# Patient Record
Sex: Female | Born: 1970 | Race: Black or African American | Hispanic: No | Marital: Married | Smoking: Never smoker
Health system: Southern US, Community
[De-identification: ages and names within clinical notes are randomized; demographics above are authoritative.]

## PROBLEM LIST (undated history)

## (undated) DIAGNOSIS — F419 Anxiety disorder, unspecified: Secondary | ICD-10-CM

## (undated) DIAGNOSIS — E785 Hyperlipidemia, unspecified: Secondary | ICD-10-CM

## (undated) DIAGNOSIS — D472 Monoclonal gammopathy: Secondary | ICD-10-CM

## (undated) DIAGNOSIS — D649 Anemia, unspecified: Secondary | ICD-10-CM

## (undated) DIAGNOSIS — K219 Gastro-esophageal reflux disease without esophagitis: Secondary | ICD-10-CM

## (undated) DIAGNOSIS — F32A Depression, unspecified: Secondary | ICD-10-CM

## (undated) DIAGNOSIS — T7840XA Allergy, unspecified, initial encounter: Secondary | ICD-10-CM

## (undated) DIAGNOSIS — F329 Major depressive disorder, single episode, unspecified: Secondary | ICD-10-CM

## (undated) HISTORY — DX: Monoclonal gammopathy: D47.2

## (undated) HISTORY — DX: Anemia, unspecified: D64.9

## (undated) HISTORY — DX: Allergy, unspecified, initial encounter: T78.40XA

## (undated) HISTORY — DX: Hyperlipidemia, unspecified: E78.5

## (undated) HISTORY — PX: TUBAL LIGATION: SHX77

## (undated) HISTORY — DX: Gastro-esophageal reflux disease without esophagitis: K21.9

---

## 1998-12-17 ENCOUNTER — Inpatient Hospital Stay (HOSPITAL_COMMUNITY): Admission: AD | Admit: 1998-12-17 | Discharge: 1998-12-17 | Payer: Self-pay | Admitting: *Deleted

## 1998-12-17 ENCOUNTER — Encounter: Payer: Self-pay | Admitting: *Deleted

## 1998-12-17 ENCOUNTER — Encounter: Admission: RE | Admit: 1998-12-17 | Discharge: 1998-12-17 | Payer: Self-pay | Admitting: Family Medicine

## 1999-07-31 ENCOUNTER — Inpatient Hospital Stay (HOSPITAL_COMMUNITY): Admission: AD | Admit: 1999-07-31 | Discharge: 1999-08-02 | Payer: Self-pay | Admitting: Obstetrics and Gynecology

## 1999-07-31 ENCOUNTER — Encounter (INDEPENDENT_AMBULATORY_CARE_PROVIDER_SITE_OTHER): Payer: Self-pay | Admitting: Specialist

## 2000-01-02 ENCOUNTER — Encounter: Admission: RE | Admit: 2000-01-02 | Discharge: 2000-01-02 | Payer: Self-pay | Admitting: Family Medicine

## 2000-04-20 ENCOUNTER — Encounter: Admission: RE | Admit: 2000-04-20 | Discharge: 2000-04-20 | Payer: Self-pay | Admitting: Family Medicine

## 2000-12-18 ENCOUNTER — Encounter: Admission: RE | Admit: 2000-12-18 | Discharge: 2000-12-18 | Payer: Self-pay | Admitting: Sports Medicine

## 2001-03-08 ENCOUNTER — Encounter: Admission: RE | Admit: 2001-03-08 | Discharge: 2001-03-08 | Payer: Self-pay | Admitting: Family Medicine

## 2002-01-18 ENCOUNTER — Emergency Department (HOSPITAL_COMMUNITY): Admission: EM | Admit: 2002-01-18 | Discharge: 2002-01-18 | Payer: Self-pay | Admitting: Emergency Medicine

## 2002-05-01 ENCOUNTER — Encounter: Admission: RE | Admit: 2002-05-01 | Discharge: 2002-05-01 | Payer: Self-pay | Admitting: Family Medicine

## 2002-10-22 ENCOUNTER — Encounter: Admission: RE | Admit: 2002-10-22 | Discharge: 2002-10-22 | Payer: Self-pay | Admitting: Family Medicine

## 2003-05-21 ENCOUNTER — Encounter: Admission: RE | Admit: 2003-05-21 | Discharge: 2003-05-21 | Payer: Self-pay | Admitting: Sports Medicine

## 2003-07-28 ENCOUNTER — Emergency Department (HOSPITAL_COMMUNITY): Admission: EM | Admit: 2003-07-28 | Discharge: 2003-07-28 | Payer: Self-pay | Admitting: Emergency Medicine

## 2003-07-28 ENCOUNTER — Encounter: Payer: Self-pay | Admitting: Emergency Medicine

## 2003-09-09 ENCOUNTER — Encounter: Admission: RE | Admit: 2003-09-09 | Discharge: 2003-09-09 | Payer: Self-pay | Admitting: Family Medicine

## 2004-05-26 ENCOUNTER — Encounter: Admission: RE | Admit: 2004-05-26 | Discharge: 2004-05-26 | Payer: Self-pay | Admitting: Family Medicine

## 2004-05-26 ENCOUNTER — Ambulatory Visit: Payer: Self-pay | Admitting: Family Medicine

## 2005-06-13 ENCOUNTER — Encounter (INDEPENDENT_AMBULATORY_CARE_PROVIDER_SITE_OTHER): Payer: Self-pay | Admitting: *Deleted

## 2005-06-13 ENCOUNTER — Ambulatory Visit: Payer: Self-pay | Admitting: Family Medicine

## 2005-06-13 LAB — CONVERTED CEMR LAB

## 2007-01-10 DIAGNOSIS — D509 Iron deficiency anemia, unspecified: Secondary | ICD-10-CM

## 2007-01-11 ENCOUNTER — Encounter (INDEPENDENT_AMBULATORY_CARE_PROVIDER_SITE_OTHER): Payer: Self-pay | Admitting: *Deleted

## 2007-09-12 ENCOUNTER — Ambulatory Visit: Payer: Self-pay | Admitting: Internal Medicine

## 2007-09-12 LAB — CONVERTED CEMR LAB
ALT: 15 units/L (ref 0–35)
AST: 19 units/L (ref 0–37)
Albumin: 4.1 g/dL (ref 3.5–5.2)
Alkaline Phosphatase: 48 units/L (ref 39–117)
BUN: 11 mg/dL (ref 6–23)
Basophils Absolute: 0 10*3/uL (ref 0.0–0.1)
Basophils Relative: 0 % (ref 0.0–1.0)
CO2: 29 meq/L (ref 19–32)
Calcium: 9.7 mg/dL (ref 8.4–10.5)
Chloride: 105 meq/L (ref 96–112)
Creatinine, Ser: 0.9 mg/dL (ref 0.4–1.2)
Eosinophils Absolute: 0 10*3/uL (ref 0.0–0.6)
Eosinophils Relative: 0.3 % (ref 0.0–5.0)
GFR calc Af Amer: 91 mL/min
GFR calc non Af Amer: 75 mL/min
Glucose, Bld: 97 mg/dL (ref 70–99)
HCT: 35.7 % — ABNORMAL LOW (ref 36.0–46.0)
Hemoglobin: 11.9 g/dL — ABNORMAL LOW (ref 12.0–15.0)
Lymphocytes Relative: 36 % (ref 12.0–46.0)
MCHC: 33.3 g/dL (ref 30.0–36.0)
MCV: 70.5 fL — ABNORMAL LOW (ref 78.0–100.0)
Monocytes Absolute: 0.6 10*3/uL (ref 0.2–0.7)
Monocytes Relative: 9.5 % (ref 3.0–11.0)
Neutro Abs: 3.7 10*3/uL (ref 1.4–7.7)
Neutrophils Relative %: 54.2 % (ref 43.0–77.0)
Platelets: 250 10*3/uL (ref 150–400)
Potassium: 4.1 meq/L (ref 3.5–5.1)
RBC: 5.06 M/uL (ref 3.87–5.11)
RDW: 15.1 % — ABNORMAL HIGH (ref 11.5–14.6)
Sed Rate: 35 mm/hr — ABNORMAL HIGH (ref 0–25)
Sodium: 141 meq/L (ref 135–145)
Total Bilirubin: 0.6 mg/dL (ref 0.3–1.2)
Total Protein: 8.4 g/dL — ABNORMAL HIGH (ref 6.0–8.3)
WBC: 6.7 10*3/uL (ref 4.5–10.5)

## 2007-09-25 ENCOUNTER — Ambulatory Visit: Payer: Self-pay | Admitting: Internal Medicine

## 2007-10-02 ENCOUNTER — Ambulatory Visit: Payer: Self-pay | Admitting: Cardiology

## 2007-10-07 ENCOUNTER — Encounter: Payer: Self-pay | Admitting: Internal Medicine

## 2007-10-07 DIAGNOSIS — E7800 Pure hypercholesterolemia, unspecified: Secondary | ICD-10-CM

## 2007-10-07 DIAGNOSIS — L80 Vitiligo: Secondary | ICD-10-CM | POA: Insufficient documentation

## 2007-10-07 HISTORY — DX: Pure hypercholesterolemia, unspecified: E78.00

## 2007-10-23 ENCOUNTER — Ambulatory Visit: Payer: Self-pay | Admitting: Internal Medicine

## 2007-10-24 LAB — CONVERTED CEMR LAB
Basophils Absolute: 0 10*3/uL (ref 0.0–0.1)
Eosinophils Absolute: 0 10*3/uL (ref 0.0–0.6)
HCT: 33.7 % — ABNORMAL LOW (ref 36.0–46.0)
MCHC: 33.2 g/dL (ref 30.0–36.0)
MCV: 70.8 fL — ABNORMAL LOW (ref 78.0–100.0)
Monocytes Relative: 9.8 % (ref 3.0–11.0)
Neutrophils Relative %: 54.7 % (ref 43.0–77.0)
RBC: 4.76 M/uL (ref 3.87–5.11)

## 2008-07-08 ENCOUNTER — Encounter: Payer: Self-pay | Admitting: *Deleted

## 2008-07-15 ENCOUNTER — Ambulatory Visit: Payer: Self-pay | Admitting: Family Medicine

## 2008-07-15 DIAGNOSIS — F411 Generalized anxiety disorder: Secondary | ICD-10-CM

## 2008-07-15 DIAGNOSIS — F4010 Social phobia, unspecified: Secondary | ICD-10-CM | POA: Insufficient documentation

## 2008-07-29 ENCOUNTER — Ambulatory Visit: Payer: Self-pay | Admitting: Family Medicine

## 2008-07-29 ENCOUNTER — Encounter: Payer: Self-pay | Admitting: Family Medicine

## 2008-08-04 ENCOUNTER — Encounter: Payer: Self-pay | Admitting: Family Medicine

## 2008-08-04 LAB — CONVERTED CEMR LAB
Ferritin: 84 ng/mL (ref 10–291)
HCT: 33.8 % — ABNORMAL LOW (ref 36.0–46.0)
Iron: 55 ug/dL (ref 42–145)
MCV: 72.8 fL — ABNORMAL LOW (ref 78.0–100.0)
Platelets: 267 10*3/uL (ref 150–400)
RBC: 4.64 M/uL (ref 3.87–5.11)
Total CHOL/HDL Ratio: 3.4
VLDL: 11 mg/dL (ref 0–40)
WBC: 4.8 10*3/uL (ref 4.0–10.5)

## 2008-08-05 ENCOUNTER — Telehealth: Payer: Self-pay | Admitting: *Deleted

## 2008-08-25 ENCOUNTER — Telehealth: Payer: Self-pay | Admitting: *Deleted

## 2008-08-26 ENCOUNTER — Telehealth: Payer: Self-pay | Admitting: Family Medicine

## 2008-09-18 ENCOUNTER — Encounter (INDEPENDENT_AMBULATORY_CARE_PROVIDER_SITE_OTHER): Payer: Self-pay | Admitting: Family Medicine

## 2008-09-18 ENCOUNTER — Ambulatory Visit: Payer: Self-pay | Admitting: Family Medicine

## 2008-09-18 ENCOUNTER — Encounter: Payer: Self-pay | Admitting: Family Medicine

## 2008-09-18 LAB — CONVERTED CEMR LAB: TSH: 1.253 microintl units/mL (ref 0.350–4.50)

## 2008-09-24 ENCOUNTER — Encounter: Payer: Self-pay | Admitting: Family Medicine

## 2008-10-12 ENCOUNTER — Telehealth: Payer: Self-pay | Admitting: Family Medicine

## 2008-12-08 ENCOUNTER — Emergency Department (HOSPITAL_COMMUNITY): Admission: EM | Admit: 2008-12-08 | Discharge: 2008-12-09 | Payer: Self-pay | Admitting: Emergency Medicine

## 2009-01-04 ENCOUNTER — Ambulatory Visit: Payer: Self-pay | Admitting: Family Medicine

## 2009-01-04 DIAGNOSIS — F329 Major depressive disorder, single episode, unspecified: Secondary | ICD-10-CM

## 2009-01-28 ENCOUNTER — Telehealth: Payer: Self-pay | Admitting: Family Medicine

## 2009-02-11 ENCOUNTER — Ambulatory Visit: Payer: Self-pay | Admitting: Family Medicine

## 2009-02-26 ENCOUNTER — Telehealth: Payer: Self-pay | Admitting: *Deleted

## 2009-03-01 ENCOUNTER — Ambulatory Visit: Payer: Self-pay | Admitting: Family Medicine

## 2009-03-17 ENCOUNTER — Telehealth: Payer: Self-pay | Admitting: Family Medicine

## 2009-04-15 ENCOUNTER — Encounter: Payer: Self-pay | Admitting: Family Medicine

## 2010-06-21 ENCOUNTER — Encounter: Payer: Self-pay | Admitting: Family Medicine

## 2010-12-15 NOTE — Miscellaneous (Signed)
  Clinical Lists Changes  Medications: Removed medication of LORAZEPAM 0.5 MG TABS (LORAZEPAM) 1-2 two times a day to three times a day as needed anxiety

## 2011-02-27 LAB — DIFFERENTIAL
Basophils Relative: 1 % (ref 0–1)
Eosinophils Absolute: 0.2 10*3/uL (ref 0.0–0.7)
Eosinophils Relative: 2 % (ref 0–5)
Lymphs Abs: 3.4 10*3/uL (ref 0.7–4.0)
Neutrophils Relative %: 51 % (ref 43–77)

## 2011-02-27 LAB — POCT I-STAT, CHEM 8
BUN: 22 mg/dL (ref 6–23)
Calcium, Ion: 1.13 mmol/L (ref 1.12–1.32)
Hemoglobin: 12.6 g/dL (ref 12.0–15.0)
Sodium: 141 mEq/L (ref 135–145)
TCO2: 26 mmol/L (ref 0–100)

## 2011-02-27 LAB — CBC
MCHC: 31.9 g/dL (ref 30.0–36.0)
MCV: 72.1 fL — ABNORMAL LOW (ref 78.0–100.0)
Platelets: 247 10*3/uL (ref 150–400)

## 2011-02-27 LAB — D-DIMER, QUANTITATIVE: D-Dimer, Quant: 0.28 ug/mL-FEU (ref 0.00–0.48)

## 2011-02-27 LAB — POCT CARDIAC MARKERS

## 2011-03-28 NOTE — Assessment & Plan Note (Signed)
[facility]                             PULMONARY OFFICE NOTE   NAME:[name], [name]                   MRN:          [id number]  DATE:09/12/2007                            DOB:          [birth year]    CHIEF COMPLAINT:  Lungs giving off a bad odor.   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  Both of these  problems came on about the same time and have already been evaluated by  both ENT (she cannot tell me who it was, but she underwent tonsillectomy  for this purpose and continues to intermittently cough up scabs of  tissue that she dates back all the way to the time of the throat  surgery).  She has been trying to treat the dry mouth with lots of mint  products and having no improvement in her breath, which she perceives to  be quite malodorous.  However, her husband has not been aware of a  problem.  She says that co-workers do notice it.   Interestingly, it makes no difference in terms of what she eats or what  time of day or night it is, that is, the symptom continues at the same  level 24 hours a day in terms of intensity.  There is nothing that makes  it better, and there is nothing that makes it worse.  She has also had  extensive dental work done and been tried on decongestant antihistamines  with no benefit.   The patient has also been seen by Dr. Wandalee [name], who does not feel her  halitosis is coming from her GI tract.  She does not recall any  procedures or interventions being done by Dr. Evette [name].   PAST MEDICAL HISTORY:  Tonsillectomy in 2000.   ALLERGIES:  SULFA CAUSES HIVES.   MEDICATIONS:  She uses lots of mint and menthol products, but no  medicated lozenges or medications.   SOCIAL HISTORY:  She has never smoked, she works as a Lawyer.   FAMILY HISTORY:  Positive for asthma in her mother and apparently  rheumatoid arthritis in a maternal  grandmother.   REVIEW OF SYSTEMS:  Taken in detail on work sheet and negative except  for anxiety and depression.   PHYSICAL EXAMINATION:  GENERAL:  This is a stoic, black female with  somewhat of a belle indifference affect and attitude.  VITAL SIGNS:  Stable.  HEENT:  Remarkably unremarkable, oropharynx is clear, and I did not  detect any halitosis, dentition appears intact, nasopharynx and ear  canals are clear bilaterally.  NECK:  Supple without cervical adenopathy or tenderness, trachea is  midline.  LUNGS:  The lung fields are perfectly clear bilaterally to auscultation  and percussion.  HEART:  Regular rhythm without murmur, gallop, or rub.  ABDOMEN:  Soft and nondistended.  EXTREMITIES:  No calf tenderness, cyanosis, clubbing, or edema.   IMPRESSION:  Chronic dry mouth associated with halitosis subjectively of  unclear etiology.  There are several possibilities here.  One is of  occult sinus  disease with olfactory dysfunction, the other is Sjogren's  syndrome.  Against both of these is the fact that the symptom does not  wax or wane but is pretty much constant 24 hours a day for the last 10  to 15 years.  This makes it much more likely that we are dealing with a  functional problem, especially since she is detecting halitosis today  and I detect none at all (nor does her husband).   To sort through the differential, I did recommend starting with a  chemistry profile looking at liver function tests and a CBC with  differential, a sed rate, and ANA.  A chest x-ray will also be obtained,  with a followup head and neck CT scan to be considered as well.   I have also asked her to see her ENT doctor the next time she feels that  there is a scab in her throat to be evaluated and to report back to me  what his name is so that I can record it in our records and send him a  copy of my thoughts.     Jessica [name]. Jessica Sires, MD, [facility]  Electronically Signed    MBW/MedQ  DD: 09/12/2007   DT: 09/12/2007  Job #: [id number]   cc:   Jessica [name], D.D.S.

## 2011-03-28 NOTE — Assessment & Plan Note (Signed)
[facility]                             PULMONARY OFFICE NOTE   NAME:Jessica [name], Jessica [name]                   MRN:          [id number]  DATE:09/25/2007                            DOB:          [birth year]    PULMONARY/EXTENDED SUMMARY FOLLOW-UP VISIT:   HISTORY:  A 40 year old black female with self-perceived halitosis,  (which neither her husband or I can appreciate).  It is present 24  hours a day and has been present for over 15 years with negative GI and  ENT evaluation previously.  Initially, she has also been told in the  past that she had iron deficiency.  It has never been corrected.  She  can't take iron pills.  The reason for this is they make her  nauseated. She has no unusual changes in bowel or bladder habits over  the last 15 years and has regular periods.   PHYSICAL EXAMINATION:  She is a somber but not overtly depressed  ambulatory black female in no acute distress.  She is afebrile with normal vital signs.  HEENT:  Unremarkable.  Her oropharynx is clear.  I do not appreciate any  halitosis at all.  NECK:  Supple without cervical adenopathy or tenderness.  Trachea is  midline.  No thyromegaly.  LUNGS:  Perfectly clear bilaterally to auscultation and percussion.  HEART:  Regular rhythm without murmur, rub or gallop.  ABDOMEN:  Soft, benign.  EXTREMITIES:  Warm without calf tenderness, clubbing, cyanosis or edema.   Chest x-ray was reviewed from September 12, 2007.  It is normal.   Lab studies were remarkable for a hematocrit of 35% with an MCV of 70.  Normal LFTs and renal function.   IMPRESSION:  1. No obvious explanation for halitosis.  The next step in her workup      would be to do a sinus and head CT scan with contrast, looking for      something that is unusual, as an olfactory or frontal lobe tumor,      since this has not been done.  No further workup is planned.  2. The iron-deficiency anemia may actually be more significant  than      the patient appreciates.  There are certainly unusual syndromes      that occur with iron deficiency and altered perception of olfaction      and taste have been reported; thus, the Pica syndrome.  I have      recommended that she start on NuIron 150 1 daily and return in four      weeks to see our nurse practitioner to make sure she is tolerating      it and at that point, recheck the CBC, iron, and TIBC to make sure      she is making headway.  If not, oncology/hematology evaluation      could be considered.    Jessica [name]. Jessica Sires, MD, [facility]  Electronically Signed   MBW/MedQ  DD: 09/25/2007  DT: 09/26/2007  Job #: [id number]

## 2012-07-28 ENCOUNTER — Ambulatory Visit (INDEPENDENT_AMBULATORY_CARE_PROVIDER_SITE_OTHER): Payer: BC Managed Care – PPO | Admitting: Family Medicine

## 2012-07-28 DIAGNOSIS — Z23 Encounter for immunization: Secondary | ICD-10-CM

## 2012-09-02 ENCOUNTER — Ambulatory Visit (HOSPITAL_COMMUNITY)
Admission: RE | Admit: 2012-09-02 | Discharge: 2012-09-02 | Disposition: A | Discharge status: Home / Self Care | Payer: BC Managed Care – PPO | Attending: Psychiatry | Admitting: Psychiatry

## 2012-09-02 ENCOUNTER — Encounter (HOSPITAL_COMMUNITY): Payer: Self-pay | Admitting: *Deleted

## 2012-09-02 DIAGNOSIS — F329 Major depressive disorder, single episode, unspecified: Secondary | ICD-10-CM | POA: Insufficient documentation

## 2012-09-02 DIAGNOSIS — F411 Generalized anxiety disorder: Secondary | ICD-10-CM | POA: Insufficient documentation

## 2012-09-02 DIAGNOSIS — F3289 Other specified depressive episodes: Secondary | ICD-10-CM | POA: Insufficient documentation

## 2012-09-02 HISTORY — DX: Depression, unspecified: F32.A

## 2012-09-02 HISTORY — DX: Anxiety disorder, unspecified: F41.9

## 2012-09-02 HISTORY — DX: Major depressive disorder, single episode, unspecified: F32.9

## 2012-09-02 NOTE — H&P (Signed)
Behavioral Health Medical Screening Exam  Jessica [name] is an 41 y.o. female.  Review of Systems  Constitutional: Negative.   HENT: Negative.   Eyes: Negative.   Respiratory: Negative.   Cardiovascular: Negative.   Gastrointestinal: Negative.   Genitourinary: Negative.   Musculoskeletal: Negative.   Skin: Negative.   Neurological: Negative.   Endo/Heme/Allergies: Negative.   Psychiatric/Behavioral: Positive for depression (Gotten worse had for 20 yr or more.  some thoughts of SI.  Yesterday but not today Rates 7) and suicidal ideas (Yesterday having SI.  Not today.  ). Negative for hallucinations, memory loss and substance abuse. The patient is nervous/anxious ([facility] therapy is making nervous.  over a 10) and has insomnia (take allergy med to help sleep.  onece take med to sleep will stay sleep.   Has a problem getting to sleep.  when wake up in the am feeling tired).     Physical Exam  Constitutional: She is oriented to person, place, and time. She appears well-developed and well-nourished. No distress.  HENT:  Head: Normocephalic and atraumatic.  Mouth/Throat: No oropharyngeal exudate.  Eyes: Pupils are equal, round, and reactive to light.  Neck: Normal range of motion. Neck supple.  GI: Soft. Bowel sounds are normal. She exhibits no distension. There is no tenderness.  Musculoskeletal: She exhibits no edema and no tenderness.  Neurological: She is alert and oriented to person, place, and time. She displays normal reflexes.  Skin: Skin is warm and dry. She is not diaphoretic.    There were no vitals taken for this visit.  Recommendations: Resources given by ACT Discussed group therapy Based on my evaluation the patient does not appear to have an emergency medical condition.  Rankin, Shuvon 09/02/2012, 2:47 PM

## 2012-09-02 NOTE — H&P (Signed)
Agree with the physical exam 

## 2012-09-02 NOTE — BH Assessment (Signed)
Assessment Note   Jessica [name] is an 41 y.o. female. Pt is walk in to [facility] accompanied by husband. Pt reports current out patient thaerapist recommended she come for evaluation due to SI with fleeting plans. She thinks of crashing car occasionally when driving, then thinks she would not want to harm others. She has had thoughts to OD but would not want to hurt her husband or daughters. Pt states she has not had SI today. Feels like crying every day but usually talks herself out of it.  Pt reports years of anxiety and depression, once was prescribed Zoloft and lorazepam, but disliked how it made her feel, and has not taken any medications for 4 years. Pt and husband report preoccupation with bad breath. She has sought help from Dr's and Dentists who report she is not having any medical problem. She can not be reassured, "People have said so...". Pt has been overwhelmed by fears about family and death. 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 48 year old daughter had 2 auto accidents this year. Husband is a Naval architect and a friend was seriously injured in truck accident. Pt sleeps poorly and uses antihistamine 3 x week for sleep. Her appetite is poor and she has lost 7 lbs in past month (pt is thin but not emaciated). She reports she is walking more as stress relief and wt loss may be due to the exercize. She is also cleaning her home excessively so she can be in control of "something". Discussed options. Pt and husband feel she can be safe at home and she requests IOP. Telephone call to [facility] with IOP and pt will stant Thursday morning at 9am, she and husband will pick up paperwork today. Pt and husband agree to return if symptoms worsen or she feels unsafe.  Axis I: Anxiety Disorder NOS and Depressive Disorder NOS Axis II:  Deferred Axis III:  Past Medical History  Diagnosis Date  . Anxiety   . Depression    Axis IV: occupational problems, other psychosocial or environmental problems and problems related to social environment Axis V: 41-50 serious symptoms  Past Medical History:  Past Medical History  Diagnosis Date  . Anxiety   . Depression     No past surgical history on file.  Family History: No family history on file.  Social History:  reports that she has never smoked. She has never used smokeless tobacco. She reports that she does not drink alcohol or use illicit drugs.  Additional Social History:  Alcohol / Drug Use Pain Medications: not abusing Prescriptions: not abusing Over the Counter: not abusing History of alcohol / drug use?: No history of alcohol / drug abuse  CIWA:   COWS:    Allergies:  Allergies  Allergen Reactions  . Sulfonamide Derivatives     REACTION: hives    Home Medications:  (Not in a hospital admission)  OB/GYN Status:  No LMP recorded.  General Assessment Data [facility] of Assessment: [facility] Assessment Services Living Arrangements: Spouse/significant other;Other relatives (2 children) Can pt return to current living arrangement?: Yes Admission Status: Voluntary Is patient capable of signing voluntary admission?: Yes Referral Source: Other (therapist )  Education Status Is patient currently in school?: No  Risk to self Suicidal Ideation: Yes-Currently Present Suicidal  Intent: No-Not Currently/Within Last 6 Months Is patient at risk for suicide?: No Suicidal Plan?: Yes-Currently Present Specify Current Suicidal Plan: thought when in a car to crash  (or thoughts to OD) Access to Means: Yes (does drive and OTC meds at home) What has been your use of drugs/alcohol within the last 12 months?: none Previous Attempts/Gestures: No How many times?: 0  Other Self Harm Risks: 0 Intentional Self Injurious Behavior: None Family Suicide History: Unknown Recent  stressful life event(s): Loss (Comment) (multiple deaths and stopped working 213) Persecutory voices/beliefs?: Yes (that she has bad breath) Depression: Yes Depression Symptoms: Insomnia;Despondent;Tearfulness;Feeling worthless/self pity Substance abuse history and/or treatment for substance abuse?: No Suicide prevention information given to non-admitted patients: Yes  Risk to Others Homicidal Ideation: No Thoughts of Harm to Others: No Current Homicidal Intent: No Current Homicidal Plan: No Access to Homicidal Means: No History of harm to others?: No Assessment of Violence: None Noted Does patient have access to weapons?: No Criminal Charges Pending?: No Does patient have a court date: No  Psychosis Hallucinations: None noted Delusions: Somatic (bad breath)  Mental Status Report Appear/Hygiene: Meticulous Eye Contact: Fair Motor Activity: Rigidity Speech: Soft;Logical/coherent Level of Consciousness: Alert Mood: Depressed;Anxious;Apprehensive Affect: Angry;Depressed;Apprehensive Anxiety Level: Moderate Thought Processes: Coherent;Relevant Judgement: Unimpaired Orientation: Person;Place;Time;Situation Obsessive Compulsive Thoughts/Behaviors: Minimal  Cognitive Functioning Concentration: Decreased Memory: Recent Intact;Remote Intact IQ: Average Insight: Fair Impulse Control: Good Appetite: Poor Weight Loss: 7  Weight Gain: 0  Sleep: Decreased Total Hours of Sleep:  (needs benadryl to sleep 2-3 x week) Vegetative Symptoms: None  ADLScreening [facility] Assessment Services) Patient's cognitive ability adequate to safely complete daily activities?: Yes Patient able to express need for assistance with ADLs?: Yes Independently performs ADLs?: Yes (appropriate for developmental age)  Abuse/Neglect [facility]) Verbal Abuse: Denies  Prior Inpatient Therapy Prior Inpatient Therapy: No  Prior Outpatient Therapy Prior Outpatient Therapy: Yes Prior Therapy Dates: current Prior  Therapy Facilty/Provider(s): [facility] ([phone]) Reason for Treatment: depression/anxiety  ADL Screening (condition at time of admission) Patient's cognitive ability adequate to safely complete daily activities?: Yes Patient able to express need for assistance with ADLs?: Yes Independently performs ADLs?: Yes (appropriate for developmental age) Weakness of Legs: None Weakness of Arms/Hands: None  Home Assistive Devices/Equipment Home Assistive Devices/Equipment: None    Abuse/Neglect Assessment (Assessment to be complete while patient is alone) Verbal Abuse: Denies Exploitation of patient/patient's resources: Denies Self-Neglect: Denies       Nutrition Screen- [facility] Adult/[facility]/AP Patient's home diet: Regular Have you recently lost weight without trying?: Yes If yes, how much weight have you lost?: 2-13 lb Have you been eating poorly because of a decreased appetite?:  (pt reports walking more also) Malnutrition Screening Tool Score: 2   Additional Information 1:1 In Past 12 Months?: No CIRT Risk: No Elopement Risk: No Does patient have medical clearance?: No     Disposition:  Disposition Disposition of Patient: Outpatient treatment Type of outpatient treatment: Psych Intensive Outpatient  On Site Evaluation by:   Reviewed with Physician:     Conan Bowens 09/02/2012 5:49 PM

## 2012-09-05 ENCOUNTER — Encounter (HOSPITAL_COMMUNITY): Payer: Self-pay

## 2012-09-05 ENCOUNTER — Other Ambulatory Visit (HOSPITAL_COMMUNITY): Payer: BC Managed Care – PPO | Attending: Psychiatry

## 2012-09-05 DIAGNOSIS — F32A Depression, unspecified: Secondary | ICD-10-CM

## 2012-09-05 DIAGNOSIS — F411 Generalized anxiety disorder: Secondary | ICD-10-CM | POA: Insufficient documentation

## 2012-09-05 DIAGNOSIS — F329 Major depressive disorder, single episode, unspecified: Secondary | ICD-10-CM

## 2012-09-05 DIAGNOSIS — F419 Anxiety disorder, unspecified: Secondary | ICD-10-CM

## 2012-09-05 NOTE — Progress Notes (Unsigned)
    Daily Group Progress Note  Program: IOP  Group Time: 9:00-10:30 am   Participation Level: Active  Behavioral Response: Appropriate  Type of Therapy:  Process Group  Summary of Progress: Today is patients first day in the group. She reports high anxiety associated with being in a group setting and feeling fearful from being around others. She is focused on trusting the group and feeling safe to share. She was introduced to the group and observed the group process.      Group Time: 10:30 am - 12:00 pm   Participation Level:  Active  Behavioral Response: Appropriate  Type of Therapy: Psycho-education Group  Summary of Progress: Patient learned the CBT skill of "Reframing" negative thoughts into neutral thoughts to reduce feelings of anxiety and depression.  Maxcine Ham, MSW, LCSW

## 2012-09-06 ENCOUNTER — Encounter (HOSPITAL_COMMUNITY): Payer: Self-pay

## 2012-09-06 ENCOUNTER — Other Ambulatory Visit (HOSPITAL_COMMUNITY): Payer: BC Managed Care – PPO

## 2012-09-06 DIAGNOSIS — F4010 Social phobia, unspecified: Secondary | ICD-10-CM

## 2012-09-06 MED ORDER — FLUVOXAMINE MALEATE 50 MG PO TABS: 50.0000 mg | ORAL_TABLET | Freq: Every day | ORAL | Status: DC

## 2012-09-06 NOTE — Progress Notes (Unsigned)
Psychiatric Assessment Adult  Patient Identification:  Jessica [name] Date of Evaluation:  09/06/2012 Chief Complaint: History of Chief Complaint:  No chief complaint on file. this patient is a 41 year old African American married mother who then referred to this setting because of anxiety. 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 The patient did married for 18 years and a good marriage. The patient's complaints are specifically that she feels extreme anxiety when she's in groups. She avoids going out of her house for she thinks she's going to do something embarrassing and ambulate herself. The patient also has fears that she has bad breath which he claims keeps her from being around people. The patient denies persistent daily depression. She says she sleeps only because she takes Benadryl for the last few months. The patient's appetite is normal and she is a good energy level. She claims she can think and concentrate fairly well. She does acknowledge that she feels worthless. She denies suicidal ideation but says she has fleeting thoughts now and then. She's never attempted suicide and has good reasons not to act. The patient denies the use of alcohol or drugs she denies any psychotic symptomatology. She denies mania. She denies a distinct episode of major depression. She denies anxiety symptoms consistent with generalized anxiety disorder, panic disorder or OCD. She does have some obsessive features like when he to be clean and orderly but it does not meet the criteria of OCD. The patient is no past psychiatric history. She's never been seen by a psychiatrist or hospitalized. Her primary care Dr. Lenard Galloway for Zoloft but it made her feel anxious and strange. The biggest stress in this patient's life and trauma is losing her mother 8 years ago 2 a heart attack. Her other big stressor is the death of her father this year who is a drug abuser. Patient describes herself  as somebody usually takes care of people particularly all people in her family. 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 She makes her self to the shopping she has to care for her 37 year old daughter. She's not sure if she will be able to gain from coming to group therapy here at the IOP  HPI Review of Systems Physical Exam  Depressive Symptoms: depressed mood,  (Hypo) Manic Symptoms:   Elevated Mood:  No Irritable Mood:  No Grandiosity:  No Distractibility:  No Labiality of Mood:  No Delusions:  No Hallucinations:  No Impulsivity:  No Sexually Inappropriate Behavior:  No Financial Extravagance:  No Flight of Ideas:  No  Anxiety Symptoms: Excessive Worry:  No Panic Symptoms:  No Agoraphobia:  No Obsessive Compulsive: No  Symptoms: None, Specific Phobias:  Yes Social Anxiety:  Yes  Psychotic Symptoms:  Hallucinations: No None Delusions:  No Paranoia:  No   Ideas of Reference:  No  PTSD Symptoms: Ever had a traumatic exposure:  No Had a traumatic exposure in the last month:  No Re-experiencing: No None Hypervigilance:  No Hyperarousal: No None Avoidance: Yes None  Traumatic Brain Injury: No   Past Psychiatric History: Diagnosis: Social Phobia  Hospitalizations:   Outpatient Care:   Substance Abuse Care:   Self-Mutilation:  Suicidal Attempts:   Violent Behaviors:    Past Medical History:   Past Medical History  Diagnosis Date  . Anxiety   . Depression    History of Loss of Consciousness:   Seizure History:   Cardiac History:   Allergies:   Allergies  Allergen Reactions  . Sulfonamide Derivatives     REACTION: hives   Current Medications:  Current Outpatient Prescriptions  Medication Sig Dispense Refill  . Multiple  Vitamin (MULTIVITAMIN) tablet Take 1 tablet by mouth daily.        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 Legal History: Hobbies/Interests:   Family History:   Family History  Problem Relation Age of Onset  . Alcohol abuse Father   . Drug abuse Father   . Alcohol abuse Maternal Uncle     Mental Status Examination/Evaluation: Objective:  Appearance: Casual  Eye Contact::  Good  Speech:  Clear and Coherent  Volume:  Normal  Mood:  anxiety  Affect:  Appropriate  Thought Process:  Coherent  Orientation:  Full  Thought Content:  WDL  Suicidal Thoughts:  No  Homicidal Thoughts:  No  Judgement:  Good  Insight:  Fair  Psychomotor Activity:  Normal  Akathisia:  No  Handed:  Right  AIMS (if indicated):    Assets:  Desire for Improvement    Laboratory/X-Ray Psychological Evaluation(s)        Assessment:  Axis I: Social Anxiety  AXIS I Social Anxiety  AXIS II Deferred  AXIS III Past Medical History  Diagnosis Date  . Anxiety   . Depression      AXIS IV occupational problems  AXIS V 51-60 moderate symptoms   Treatment Plan/Recommendations:  Plan of Care: Luvox 50  And refer to [facility]. For now the patient will continue in the IOP program at least for the next few days.  Laboratory:    Psychotherapy: Gretchen Short  Medications: Luvox 50  Routine PRN Medications:     Consultations:   Safety Concerns:    Other:      [facility] 10/25/20139:19 AM

## 2012-09-06 NOTE — Progress Notes (Unsigned)
    Daily Group Progress Note  Program: IOP  Group Time: 9:00-10:30 am   Participation Level: Active  Behavioral Response: Appropriate  Type of Therapy:  Process Group  Summary of Progress: Patient shared for the first time. She expressed high social anxiety and fear of taking in the group but then became tearful and talked about how overwhelmed and tired she is with dealing with the illness of depression and how she wants to feel happiness and have a purpose in life. She described how her children and family prevent her from taking her life but how she wants to get better. She denied current thoughts to harm self.     Group Time: 10:30 am - 12:00 pm   Participation Level:  Active  Behavioral Response: Appropriate  Type of Therapy: Psycho-education Group  Summary of Progress: Patient participated in a goodbye ceremony for two members ending the program today and said how they impacted her and wished them well while practicing healthy closure.   Maxcine Ham, MSW, LCSW

## 2012-09-09 ENCOUNTER — Other Ambulatory Visit (HOSPITAL_COMMUNITY): Payer: BC Managed Care – PPO

## 2012-09-10 ENCOUNTER — Other Ambulatory Visit (HOSPITAL_COMMUNITY): Payer: BC Managed Care – PPO

## 2012-09-10 NOTE — Progress Notes (Unsigned)
    Daily Group Progress Note  Program: IOP  Group Time: 9:00-10:30 am   Participation Level: Active  Behavioral Response: Appropriate  Type of Therapy:  Process Group  Summary of Progress: Patient is talking more and presents as more comfortable in a group setting. She started off parts of conversations when asked who wants to start and reports less anxiety. She was active int others discussions, but did not provide an update on her progress. She continues to work on managing anxiety in social situations and reducing depression symptoms.      Group Time: 10:30 am - 12:00 pm   Participation Level:  Active  Behavioral Response: Appropriate  Type of Therapy: Psycho-education Group  Summary of Progress: Patient participated in a goodbye ceremony for a member ending the program and practiced the skill of communication, expressing feelings and having healthy closure.   Maxcine Ham, MSW, LCSW

## 2012-09-11 ENCOUNTER — Other Ambulatory Visit (HOSPITAL_COMMUNITY): Payer: BC Managed Care – PPO

## 2012-09-12 ENCOUNTER — Other Ambulatory Visit (HOSPITAL_COMMUNITY): Payer: BC Managed Care – PPO

## 2012-09-12 ENCOUNTER — Telehealth (HOSPITAL_COMMUNITY): Payer: Self-pay | Admitting: Psychiatry

## 2012-09-13 ENCOUNTER — Other Ambulatory Visit (HOSPITAL_COMMUNITY): Payer: BC Managed Care – PPO | Attending: Psychiatry

## 2012-09-13 DIAGNOSIS — F429 Obsessive-compulsive disorder, unspecified: Secondary | ICD-10-CM | POA: Insufficient documentation

## 2012-09-13 DIAGNOSIS — G4700 Insomnia, unspecified: Secondary | ICD-10-CM | POA: Insufficient documentation

## 2012-09-13 DIAGNOSIS — F411 Generalized anxiety disorder: Secondary | ICD-10-CM | POA: Insufficient documentation

## 2012-09-13 MED ORDER — RISPERIDONE 1 MG PO TABS: 1.0000 mg | ORAL_TABLET | Freq: Every day | ORAL | Status: DC

## 2012-09-13 NOTE — Progress Notes (Unsigned)
    Daily Group Progress Note  Program: IOP  Group Time: 9:00-10:30 am   Participation Level: Active  Behavioral Response: Appropriate  Type of Therapy:  Process Group  Summary of Progress: Patient related to others who shared their symptoms of depression and she expressed similar struggles. 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 She described how she struggles with severe depression and often feels "alone" in her illness.      Group Time: 10:30 am - 12:00 pm   Participation Level:  Active  Behavioral Response: Appropriate  Type of Therapy: Psycho-education Group  Summary of Progress: Patient participated in a skills group on feelings and causes. They discussed how to manage difficult feelings and normalized difficult feelings.   Maxcine Ham, MSW, LCSW

## 2012-09-16 ENCOUNTER — Other Ambulatory Visit (HOSPITAL_COMMUNITY): Payer: BC Managed Care – PPO | Admitting: Psychiatry

## 2012-09-16 DIAGNOSIS — F4010 Social phobia, unspecified: Secondary | ICD-10-CM

## 2012-09-17 ENCOUNTER — Other Ambulatory Visit (HOSPITAL_COMMUNITY): Payer: BC Managed Care – PPO

## 2012-09-17 DIAGNOSIS — F4010 Social phobia, unspecified: Secondary | ICD-10-CM | POA: Insufficient documentation

## 2012-09-17 NOTE — Progress Notes (Unsigned)
    Daily Group Progress Note  Program: IOP  Group Time: 9:00-10:30 am   Participation Level: Minimal  Behavioral Response: Appropriate  Type of Therapy:  Process Group  Summary of Progress: Patient reports still having high social anxiety, but states it has subsided some from attending the group and relating to others with similar life experiences and symptoms. Patient was offered more time in the program but indicated she wished to end tomorrow as was original scheduled. She then questioned her decision because she knows the group has helped her reduce her anxiety. She identified her tendency is to "run and avoid" the situations that make her uncomfortable, even if they are helping her. The group requested she stay and gave her support but patient was still unsure of her decision as to when she wanted to end the program.      Group Time: 10:30 am - 12:00 pm   Participation Level:  Minimal  Behavioral Response: Appropriate  Type of Therapy: Psycho-education Group  Summary of Progress: Patient participated in an activity that promoted fun, laughter, distraction as well as allowing the group to get to know each other better. Patient admitted that it was enjoyable to laugh and be distracted from depression symptoms for a period of time. This promoted the ability to feel enjoyment during a depressive episode and teach the skills of distraction to manage symptoms.   Maxcine Ham, MSW, LCSW

## 2012-09-17 NOTE — Progress Notes (Signed)
    Daily Group Progress Note  Program: IOP  Group Time: 9:00-10:30 am    Participation Level: Minimal  Behavioral Response: Appropriate  Type of Therapy:  Process Group  Summary of Progress: Patient continues to struggle with soical anxiety and talked of how she is pushing herself to share in the group setting and feels like she is making some progress with sharing more. She talked about job stress and how difficult it is to function affectively with her anxiety level. She is working on expressing feelings to others and with "not feeling alone" in her mental illness.      Group Time: 10:30 am - 12:00 pm   Participation Level:  Active  Behavioral Response: Appropriate  Type of Therapy: Psycho-education Group  Summary of Progress:  Patient participated in a group focused on grief and loss and explored current losses and barriers to affective grieving.   Carman Ching, LCSW

## 2012-09-18 ENCOUNTER — Other Ambulatory Visit (HOSPITAL_COMMUNITY): Payer: BC Managed Care – PPO | Admitting: Psychiatry

## 2012-09-18 MED ORDER — RISPERIDONE 1 MG PO TABS: 1.0000 mg | ORAL_TABLET | Freq: Every day | ORAL | Status: DC

## 2012-09-18 MED ORDER — FLUVOXAMINE MALEATE 50 MG PO TABS: 150.0000 mg | ORAL_TABLET | Freq: Every morning | ORAL | Status: DC

## 2012-09-18 NOTE — Progress Notes (Signed)
Patient ID: Jessica [name], female   DOB: Oct 13, 1971, 41 y.o.   MRN: [id number] D:  According to pt, Thea Silversmith, [facility] is her husband's therapist not hers.  A:  Pt referred to [facility], [facility].  R:  Pt receptive.

## 2012-09-18 NOTE — Patient Instructions (Signed)
Patient completed MH-IOP today.  Will follow up [facility], LCSW on 09-19-12 @ 9 a.m and Dr. Donell Beers 10-04-12 @ 10:15 a.m..  Encouraged support groups.

## 2012-09-18 NOTE — Progress Notes (Signed)
  [facility] Intensive Outpatient Program Discharge Summary  Jessica [name] [id number]     Discharge Note  Patient:  Jessica [name] is an 41 y.o., female DOB:  Dec 21, 1970  Date of Admission:  09-05-12  Date of Discharge:  09-18-12  Reason for Admission:Depression, OCD,  Hospital Course:Pt was admitted to IOP and started on Luvox 50 mg po q am for her OCD , she tol her meds well. She continued to ruminate about her bad breath. So was started on Risperdal 0.5 mg po q hs and this helped her insomnia and her obcessions so it was increased to 1 mg q hs and the Luvox was increased to 150 mg q am.Pt did well, sleep, app-good, mood-bright , no si/ hi and decrease in her OCD,she was coping well and tol meds well.  Mental Status at Discharge:Alert, O/3, affect-pleasant. Mood-stable, mildly anxious. No Si/ HI. No Hallucinations/ delusions.  Recent/ Remote memory-good, Judgement/ insight-good, concentration/ recall-good.  Lab Results: No results found for this or any previous visit (from the past 48 hour(s)).  Current outpatient prescriptions:fluvoxaMINE (LUVOX) 50 MG tablet, Take 3 tablets (150 mg total) by mouth every morning., Disp: 90 tablet, Rfl: 0;  Multiple Vitamin (MULTIVITAMIN) tablet, Take 1 tablet by mouth daily., Disp: , Rfl: ;  risperiDONE (RISPERDAL) 1 MG tablet, Take 1 tablet (1 mg total) by mouth daily., Disp: 30 tablet, Rfl: 0 [DISCONTINUED] fluvoxaMINE (LUVOX) 50 MG tablet, Take 1 tablet (50 mg total) by mouth at bedtime., Disp: 30 tablet, Rfl: 3;  [DISCONTINUED] fluvoxaMINE (LUVOX) 50 MG tablet, Take 3 tablets (150 mg total) by mouth every morning., Disp: 90 tablet, Rfl: 0;  [DISCONTINUED] risperiDONE (RISPERDAL) 1 MG tablet, Take 1 tablet (1 mg total) by mouth daily., Disp: 30 tablet, Rfl: 0 [DISCONTINUED] risperiDONE (RISPERDAL) 1 MG tablet, Take 1 tablet (1 mg total) by mouth daily., Disp: 30 tablet, Rfl: 0  Axis Diagnosis:   Axis I: Anxiety Disorder NOS and  Obsessive Compulsive Disorder Axis II: Deferred Axis III:  Past Medical History  Diagnosis Date  . Anxiety   . Depression    Axis IV: other psychosocial or environmental problems and problems related to social environment Axis V: 61-70 mild symptoms   Level of Care:  OP  Discharge destination:  Home  Is patient on multiple antipsychotic therapies at discharge:  No    Has Patient had three or more failed trials of antipsychotic monotherapy by history:  No  Patient phone:  [phone] (home)  Patient address:   [address] [place] [address],   Follow-up recommendations:  Activity:  as  tolerated Diet:  Regular Other:  F/u with Dr Donell Beers, for meds and Gretchen Short for therapy.    The patient received suicide prevention pamphlet:  Yes Margit Banda 09/18/2012, 11:44 AM  09/18/2012

## 2012-09-18 NOTE — Progress Notes (Unsigned)
    Daily Group Progress Note  Program: IOP  Group Time: 9:00-10:30 am   Participation Level: Active  Behavioral Response: Appropriate  Type of Therapy:  Process Group  Summary of Progress: This is the patients final day in the group. She chose to end group today even though she was offered additional group time because she stated she felt more comfortable discussing her stressors with an individual therapist.  Review of progress since starting the program:  this patient is a 41 year old African American married mother who then referred to this setting because of anxiety. 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 The patient initially reported feeling extreme anxiety when she's in groups and around others. She initially sat closed off from group members and did not talk in the group, but as the days went on, she felt more comfortable talking in the group setting and began sharing how extreme her depression and anxiety symptoms were and how they negatively impacted her working and having healthy relationships with others. She shared how the anxiety became overwhelming and she was unable to work as a Lawyer and was not sure if she would be able to return to her job. She related to others with high depression and how difficult it was for her to leave her home and have the motivation to come to the group and interact. Patient starting sharing voluntarily and even read the daily thought a few times by offering to share. She never talked in the group about the trauma from the death of her mother 8 years ago or the death of her father this past year and stated she "feard opening up too much" to the group due to still having severe anxiety.  Patient wrote a poem to the group and read it today that expressed how grateful she was to the members for "allowing her to no longer feel alone in her illness". She shared how she has made progress learning and practicing the  skills of affective communication in a group setting and with pushing herself to challenge her fears of others. She reports a reduction in anxiety and depression symptoms and states she feels ready to return to her individual therapist for ongoing treatment.      Group Time: 10:30 am - 12:00 pm   Participation Level:  Active  Behavioral Response: Appropriate  Type of Therapy: Psycho-education Group  Summary of Progress: Patient participated in a discussion on how to access mental health support groups during and after their time in the program to manage depression and anxiety symptoms and maintain continued wellness.   Carman Ching, LCSW

## 2012-09-18 NOTE — Progress Notes (Signed)
Patient ID: Jessica [name], female   DOB: July 19, 1971, 41 y.o.   MRN: [id number] D:  This is a 41 yr old married african [place] female, referred by therapist Jessica [name], [facility]), treatment for anxiety and depression with SI.  Pt currently denies any SI/HI or A/V hallucinations. Although she is feeling a little better, she states she is still worrying about the future.  Improved sleep (7 hours), appetite is good, and fair concentration.  States group is helpful.  Reports opening up some.  Wants to continue working on social anxiety with her therapist.  A:D/C today.  F/U with Jessica Short, LCSW on 09-19-12 @ 9 a.m and Dr. Donell [name] on 10-04-12 @ 10:15 a.m.  Encouraged support groups.  R:  Pt receptive.

## 2012-09-19 ENCOUNTER — Other Ambulatory Visit (HOSPITAL_COMMUNITY): Payer: BC Managed Care – PPO

## 2012-09-20 ENCOUNTER — Other Ambulatory Visit (HOSPITAL_COMMUNITY): Payer: BC Managed Care – PPO

## 2012-09-23 ENCOUNTER — Other Ambulatory Visit (HOSPITAL_COMMUNITY): Payer: BC Managed Care – PPO

## 2012-09-24 ENCOUNTER — Other Ambulatory Visit (HOSPITAL_COMMUNITY): Payer: BC Managed Care – PPO

## 2012-09-25 ENCOUNTER — Other Ambulatory Visit (HOSPITAL_COMMUNITY): Payer: BC Managed Care – PPO

## 2012-09-26 ENCOUNTER — Other Ambulatory Visit (HOSPITAL_COMMUNITY): Payer: BC Managed Care – PPO

## 2012-10-04 ENCOUNTER — Ambulatory Visit (INDEPENDENT_AMBULATORY_CARE_PROVIDER_SITE_OTHER): Payer: BC Managed Care – PPO | Admitting: Psychiatry

## 2012-10-04 ENCOUNTER — Encounter (HOSPITAL_COMMUNITY): Payer: Self-pay | Admitting: Psychiatry

## 2012-10-04 DIAGNOSIS — F3342 Major depressive disorder, recurrent, in full remission: Secondary | ICD-10-CM | POA: Insufficient documentation

## 2012-10-04 DIAGNOSIS — F329 Major depressive disorder, single episode, unspecified: Secondary | ICD-10-CM

## 2012-10-04 MED ORDER — RISPERIDONE 1 MG PO TABS: 1.0000 mg | ORAL_TABLET | Freq: Every day | ORAL | Status: DC

## 2012-10-04 MED ORDER — FLUVOXAMINE MALEATE 50 MG PO TABS: 50.0000 mg | ORAL_TABLET | Freq: Every morning | ORAL | Status: DC

## 2012-10-04 NOTE — Progress Notes (Signed)
Psychiatric Assessment Adult  Patient Identification:  Jessica [name] Date of Evaluation:  10/04/2012 Chief Complaint: Follow up and establish care History of Chief Complaint:  No chief complaint on file. this patient is a 41 year old African American mother who is married and recently finished the IOP program after a ten-day period. I met her when she was initially evaluated and started the IOP program. Since being in the IOP program she is clearly better. She knowledge is that she is better coping skills. She also says that now she can communicate much better to the general population but most importantly to her husband. She was having problems communicating and negotiating but that is better. She also feels a better sense of worth and I do. Her mood is distinctly improved. At this time she is sleeping and eating well. She is a good energy level and can concentrate without problems. She enjoys walking and taking care of her grandson. The patient initially coming to the IOP had significant suicidal considerations. Now she denies any suicidal ideation. It should be noted that she's never made a suicide attempt. The patient denies the use of alcohol or drugs. She's never been psychotic. And close evaluation she is noted distant history of major depression but clearly a month ago when starting in the IOP program she did have symptoms consistent with major depression. At that time in retrospect she was persistently depressed with a reduction in her sleep. She also had significant reductions in her energy, psychomotor retardation and anhedonia. She was self limited that she seriously contemplated ending her life. The patient's major complaint is a phobia believe that she has malodorous breath. She says this is been present in her life ever since she was in middle school. Somehow it must of gotten worse in some way. It is not completely clear how she declined with an episode of major depression. The patient  has entered into therapy with Mr. Buena Irish and is doing well. She shares with me that when her Luvox dose was increased from 100 mg to a higher dose of 150 she felt worse in that she was more anxious. Noted is the patient is on Risperdal 1 mg which helped her sleep and she says in some ways does seem to help her communicate better.  HPI Review of Systems Physical Exam  Depressive Symptoms: depressed mood,  (Hypo) Manic Symptoms:   Elevated Mood:  No Irritable Mood:  No Grandiosity:  No Distractibility:  No Labiality of Mood:  No Delusions:  No Hallucinations:  No Impulsivity:  No Sexually Inappropriate Behavior:  No Financial Extravagance:  No Flight of Ideas:  No  Anxiety Symptoms: Excessive Worry:  No Panic Symptoms:  No Agoraphobia:  No Obsessive Compulsive: No  Symptoms: None, Specific Phobias:  Yes Social Anxiety:  No  Psychotic Symptoms:  Hallucinations: No None Delusions:  No Paranoia:  No   Ideas of Reference:  No  PTSD Symptoms: Ever had a traumatic exposure:  No Had a traumatic exposure in the last month:  No Re-experiencing:  None Hypervigilance:  No Hyperarousal: No None Avoidance: No None  Traumatic Brain Injury: No   Past Psychiatric History: Diagnosis: Major Depression  Hospitalizations: none  Outpatient Care: IOP  Substance Abuse Care:   Self-Mutilation:   Suicidal Attempts:   Violent Behaviors:    Past Medical History:   Past Medical History  Diagnosis Date  . Anxiety   . Depression    History of Loss of Consciousness:   Seizure History:  Cardiac History:   Allergies:   Allergies  Allergen Reactions  . Sulfonamide Derivatives     REACTION: hives   Current Medications:  Current Outpatient Prescriptions  Medication Sig Dispense Refill  . fluvoxaMINE (LUVOX) 50 MG tablet Take 3 tablets (150 mg total) by mouth every morning.  90 tablet  0  . Multiple Vitamin (MULTIVITAMIN) tablet Take 1 tablet by mouth daily.      . risperiDONE  (RISPERDAL) 1 MG tablet Take 1 tablet (1 mg total) by mouth daily.  30 tablet  0    Previous Psychotropic Medications:  Medication Dose                          Substance Abuse History in the last 12 months: Substance Age of 1st Use Last Use Amount Specific Type   Medical Consequences of Substance Abuse:   Legal Consequences of Substance Abuse:   Family Consequences of Substance Abuse:   Blackouts:   DT's:   Withdrawal Symptoms:   None  Social History: Current Place of Residence[place] of Birth:  Family Members:  Marital Status:  Married Children: 2  Sons:   Daughters:  Relationships:  Education:  Corporate treasurer Problems/Performance:  Religious Beliefs/Practices:  History of Abuse:  Teacher, music History:   Legal History:  Hobbies/Interests:   Family History:   Family History  Problem Relation Age of Onset  . Alcohol abuse Father   . Drug abuse Father   . Alcohol abuse Maternal Uncle     Mental Status Examination/Evaluation: Objective:  Appearance: Casual  Eye Contact::  Good  Speech:  Clear and Coherent  Volume:  Normal  Mood:  Normal  Affect:  Appropriate  Thought Process:  Coherent  Orientation:  Full  Thought Content:  WDL  Suicidal Thoughts:  No  Homicidal Thoughts:  No  Judgement:  Good  Insight:  Good  Psychomotor Activity:  Normal  Akathisia:  No  Handed:  Right  AIMS (if indicated):    Assets:  Desire for Improvement    Laboratory/X-Ray Psychological Evaluation(s)        Assessment:  Axis I: Major Depression, single episode  AXIS I Major Depression, single episode  AXIS II Deferred  AXIS III Past Medical History  Diagnosis Date  . Anxiety   . Depression      AXIS IV occupational problems  AXIS V 61-70 mild symptoms   Treatment Plan/Recommendations:  Plan of Care: at this time the patient will reduce her Luvox from 150 mg to 100 mg. For the time being she'll continue taking  Risperdal 1 mg every night when she returns in 2 and half months we shall consider reducing it. I suspect it was used as an add-on for seems to be an OCD feature. Technically she does not meet the criteria for an OCD condition. I suspect she had major depression and had as a feature associated with it to be obsessive about a chronic concern about her breath.  Laboratory:    Psychotherapy:[facility]  [facility]   Medications: Luvox 150,  Risperidol 1mg   Routine PRN Medications:    Consultations:   Safety Concerns:    Other:      Lucas Mallow, MD 11/22/201310:42 AM

## 2012-10-07 ENCOUNTER — Telehealth (HOSPITAL_COMMUNITY): Payer: Self-pay | Admitting: *Deleted

## 2012-10-07 NOTE — Telephone Encounter (Signed)
Opened in error

## 2012-10-17 ENCOUNTER — Other Ambulatory Visit (HOSPITAL_COMMUNITY): Payer: Self-pay | Admitting: *Deleted

## 2012-10-17 DIAGNOSIS — F3342 Major depressive disorder, recurrent, in full remission: Secondary | ICD-10-CM

## 2012-10-17 MED ORDER — RISPERIDONE 1 MG PO TABS: 1.0000 mg | ORAL_TABLET | Freq: Every day | ORAL | Status: DC

## 2012-10-17 MED ORDER — FLUVOXAMINE MALEATE 50 MG PO TABS: 50.0000 mg | ORAL_TABLET | Freq: Every morning | ORAL | Status: DC

## 2012-10-17 MED ORDER — RISPERIDONE 1 MG PO TABS
1.0000 mg | ORAL_TABLET | Freq: Every day | ORAL | Status: DC
Start: 2012-10-17 — End: 2013-03-05

## 2012-10-17 NOTE — Telephone Encounter (Signed)
RX were printed in error on 11/22. Pharmacy never received them. Sent through [employer] as directed by [employer]

## 2012-10-18 ENCOUNTER — Ambulatory Visit (HOSPITAL_COMMUNITY): Payer: Self-pay | Admitting: Psychiatry

## 2012-11-01 ENCOUNTER — Telehealth (HOSPITAL_COMMUNITY): Payer: Self-pay

## 2012-12-04 ENCOUNTER — Ambulatory Visit (INDEPENDENT_AMBULATORY_CARE_PROVIDER_SITE_OTHER): Payer: BC Managed Care – PPO | Admitting: Psychiatry

## 2012-12-04 DIAGNOSIS — F3342 Major depressive disorder, recurrent, in full remission: Secondary | ICD-10-CM

## 2012-12-04 DIAGNOSIS — F331 Major depressive disorder, recurrent, moderate: Secondary | ICD-10-CM | POA: Insufficient documentation

## 2012-12-04 DIAGNOSIS — F325 Major depressive disorder, single episode, in full remission: Secondary | ICD-10-CM

## 2012-12-04 MED ORDER — FLUVOXAMINE MALEATE 50 MG PO TABS: 50.0000 mg | ORAL_TABLET | Freq: Every morning | ORAL | Status: DC

## 2013-03-05 ENCOUNTER — Ambulatory Visit (INDEPENDENT_AMBULATORY_CARE_PROVIDER_SITE_OTHER): Payer: BC Managed Care – PPO | Admitting: Psychiatry

## 2013-03-05 DIAGNOSIS — F3342 Major depressive disorder, recurrent, in full remission: Secondary | ICD-10-CM

## 2013-03-05 DIAGNOSIS — F332 Major depressive disorder, recurrent severe without psychotic features: Secondary | ICD-10-CM | POA: Insufficient documentation

## 2013-03-05 MED ORDER — FLUVOXAMINE MALEATE 50 MG PO TABS: 50.0000 mg | ORAL_TABLET | Freq: Every morning | ORAL | Status: DC

## 2013-03-05 NOTE — Progress Notes (Signed)
[facility] MD Progress Note  03/05/2013 4:10 PM Jessica [name]  MRN:  [id number] Subjective:  Feels well Diagnosis:  Axis I: Major Depression, Recurrent severe Today the patient is seen alone. She says that she is feeling distinctly improved. She says she is more social more confident and is getting out more. She is a very good home life at this time she is caring for her 42-year-old granddaughter and recently got a puppy. The patient says she is socializing more with her husband although she wants to get closure to him. The patient seems to have much less of her preoccupation about bad breath. The patient denies any depression. She says she is sleeping and eating well. She enjoys her granddaughter, reading and watching TV. The patient is walking every morning. The patient denies problems with thinking and concentrating. She denies worthlessness. She denies suicidal ideation. Her only complaint is that she's in fact gained about 20 pounds over the last 6 months. She claims it her fault because when she is alone she is a lot. The possibility that it's related to her medications was not brought up by her. In a close evaluation the patient denies symptoms or OCD. She does not count, clean her checked. She does not have stereotypic compulsive behaviors or intrusive thoughts and ideas. The patient denies any chest pain or shortness of breath. She denies any neurological complaints. The patient has not fallen has no pain and generally has no physical complaints. The patient is functioning at a much higher level and believes the medications have been very helpful. She particularly thinks the Luvox helps her mind be a piece and not race and not assess. ADL's:  Intact  Sleep: Good  Appetite:  Good  Suicidal Ideation:  no Homicidal Ideation:  none AEB (as evidenced by):  Psychiatric Specialty Exam: ROS  There were no vitals taken for this visit.There is no weight on file to calculate BMI.  General  Appearance: Casual  Eye Contact::  Good  Speech:  Normal Rate  Volume:  Normal  Mood:  Euthymic  Affect:  Appropriate  Thought Process:  Coherent  Orientation:  Full (Time, Place, and Person)  Thought Content:  WDL  Suicidal Thoughts:  No  Homicidal Thoughts:  No  Memory:  normal  Judgement:  Good  Insight:  Good  Psychomotor Activity:nl  Concentration:  Good  Recall:  Good  Akathisia:  No  Handed:  Right  AIMS (if indicated):     Assets:  Desire for Improvement  Sleep:      Current Medications: Current Outpatient Prescriptions  Medication Sig Dispense Refill  . fluvoxaMINE (LUVOX) 50 MG tablet Take 1 tablet (50 mg total) by mouth every morning. 2 qam  60 tablet  5  . fluvoxaMINE (LUVOX) 50 MG tablet Take 1 tablet (50 mg total) by mouth every morning. 2 qam  60 tablet  5  . Multiple Vitamin (MULTIVITAMIN) tablet Take 1 tablet by mouth daily.       No current facility-administered medications for this visit.    Lab Results: No results found for this or any previous visit (from the past 48 hour(s)).  Physical Findings: AIMS:  , ,  ,  ,    CIWA:    COWS:     Treatment Plan Summary: At this time the patient has agreed to discontinue her Risperdal. She presently is taking only 0.5 mg. I suggested to her the outside chance that it might be contributing to her weight gain. I  suspect we used it for her OCD symptomatology which is now much much better. At this time we'll continue her Luvox at 100 mg a day. This patient will return to see me in 2 months giving that were stopping her Risperdal. Today we reviewed the pros and cons of all her medications and she agreed to continue taking them as prescribed with the exception of Risperdal.  Plan:  Medical Decision Making Problem Points:  Established problem, worsening (2) Data Points:  Review of new medications or change in dosage (2)  I certify that inpatient services furnished can reasonably be expected to improve the patient's  condition.   Khary Schaben IRVING 03/05/2013, 4:10 PM

## 2013-05-07 ENCOUNTER — Ambulatory Visit (HOSPITAL_COMMUNITY): Payer: Self-pay | Admitting: Psychiatry

## 2013-05-14 ENCOUNTER — Ambulatory Visit (INDEPENDENT_AMBULATORY_CARE_PROVIDER_SITE_OTHER): Payer: BC Managed Care – PPO | Admitting: Psychiatry

## 2013-05-14 DIAGNOSIS — F3342 Major depressive disorder, recurrent, in full remission: Secondary | ICD-10-CM

## 2013-05-14 DIAGNOSIS — F332 Major depressive disorder, recurrent severe without psychotic features: Secondary | ICD-10-CM

## 2013-05-14 DIAGNOSIS — F331 Major depressive disorder, recurrent, moderate: Secondary | ICD-10-CM | POA: Insufficient documentation

## 2013-05-14 MED ORDER — RISPERIDONE 0.25 MG PO TABS: 0.2500 mg | ORAL_TABLET | Freq: Two times a day (BID) | ORAL | Status: DC

## 2013-05-14 MED ORDER — FLUVOXAMINE MALEATE 50 MG PO TABS: 100.0000 mg | ORAL_TABLET | Freq: Every morning | ORAL | Status: DC

## 2013-05-14 NOTE — Progress Notes (Signed)
[facility] MD Progress Note  05/14/2013 3:06 PM Jessica [name]  MRN:  [id number] Subjective:  Not great Diagnosis:  Axis I: Major Depression, Recurrent severe Today the patient is seen on time alone. The patient says that she is actually declining. She's now become preoccupied once again about her bad breath and because of it she tends to avoid social actions. Certainly she continues to be better than she was when she was hospitalized. She's not suicidal. She denies being depressed. She describes a mild increase of anxiety. She's still quite active in that she goes to the gym on regular basis.she continues to enjoy her granddaughter and her 48 year old daughter. The patient continues to enjoy reading and watching TV and now onset of walking she goes to the gym. She's lost 5 pounds. Unfortunate is evident at the drop of Risperdal has likely made her feel worse. The patient presently is in psychotherapy with Mr. Buena Irish. She likes him very much and seems to be benefiting. Nonetheless it is evident that her emotionality is changed since being off the Risperdal and that she does feel more anxious and preoccupied. Noted is that she shows no other OCD symptomatology. This over concern seems almost delusional in nature. My wishes is to try to ultimately avoid antipsychotic medications or for the time being it looks like we should reverse courses just a bit. The patient denies persistent daily depression. He denies any symptoms of psychosis. She denies any neurological symptoms to denies any weakness or sensory changes. She denies shortness of breath chest pain or any type of pain. Generally the patient is medically well and physically fit. ADL's:  Intact  Sleep: Good  Appetite:  Good  Suicidal Ideation:  no Homicidal Ideation:  no AEB (as evidenced by):  Psychiatric Specialty Exam: ROS  There were no vitals taken for this visit.There is no weight on file to calculate BMI.  General Appearance: Casual   Eye Contact::  Good  Speech:  Clear and Coherent  Volume:  Normal  Mood:  Dysphoric  Affect:  Blunt  Thought Process:  Intact  Orientation:  Full (Time, Place, and Person)  Thought Content:  WDL  Suicidal Thoughts:  No  Homicidal Thoughts:  No  Memory:  nl  Judgement:  Good  Insight:  Good  Psychomotor Activity:  Normal  Concentration:  Good  Recall:  Good  Akathisia:  No  Handed:  Right  AIMS (if indicated):     Assets:  Communication Skills  Sleep:      Current Medications: Current Outpatient Prescriptions  Medication Sig Dispense Refill  . fluvoxaMINE (LUVOX) 50 MG tablet Take 1 tablet (50 mg total) by mouth every morning. 2 qam  60 tablet  5  . fluvoxaMINE (LUVOX) 50 MG tablet Take 2 tablets (100 mg total) by mouth every morning. 2 qam  30 tablet  5  . Multiple Vitamin (MULTIVITAMIN) tablet Take 1 tablet by mouth daily.      . risperiDONE (RISPERDAL) 0.25 MG tablet Take 1 tablet (0.25 mg total) by mouth 2 (two) times daily. 1 qhs  30 tablet  10   No current facility-administered medications for this visit.    Lab Results: No results found for this or any previous visit (from the past 48 hour(s)).  Physical Findings: AIMS:  , ,  ,  ,    CIWA:    COWS:     Treatment Plan Summary: Medication management At this time we'll restart Risperdal at a lower dose of  0.25 mg. He was taking 0.5. We should go ahead and increase her Luvox from 50 mg to 100 mg. She'll stay on this regime for 10 weeks and then we'll once again consider getting rid of her Risperdal. Is my hope that the higher dose of Luvox will be therapeutic. The patient will continue in her talking therapy. The patient agreed to these recommendations and return in 10 weeks. Plan:  Medical Decision Making Problem Points:  Established problem, worsening (2) Data Points:  Review of new medications or change in dosage (2)  I certify that inpatient services furnished can reasonably be expected to improve the  patient's condition.   Carter Kassel IRVING 05/14/2013, 3:06 PM

## 2013-07-03 ENCOUNTER — Other Ambulatory Visit (HOSPITAL_COMMUNITY): Payer: Self-pay | Admitting: *Deleted

## 2013-07-03 MED ORDER — RISPERIDONE 0.25 MG PO TABS: 0.2500 mg | ORAL_TABLET | Freq: Two times a day (BID) | ORAL | Status: DC

## 2013-07-03 NOTE — Telephone Encounter (Signed)
90 day authorized by [facility]

## 2013-07-07 ENCOUNTER — Telehealth (HOSPITAL_COMMUNITY): Payer: Self-pay | Admitting: *Deleted

## 2013-07-07 NOTE — Telephone Encounter (Signed)
Question about med - no answer at call back

## 2013-07-08 ENCOUNTER — Telehealth (HOSPITAL_COMMUNITY): Payer: Self-pay | Admitting: *Deleted

## 2013-07-08 DIAGNOSIS — F3342 Major depressive disorder, recurrent, in full remission: Secondary | ICD-10-CM

## 2013-07-08 MED ORDER — RISPERIDONE 0.25 MG PO TABS: 0.2500 mg | ORAL_TABLET | Freq: Every day | ORAL | Status: DC

## 2013-07-08 NOTE — Telephone Encounter (Signed)
Medication order changed in chart per Dr.Plovsky's orders

## 2013-07-23 ENCOUNTER — Ambulatory Visit (INDEPENDENT_AMBULATORY_CARE_PROVIDER_SITE_OTHER): Payer: BC Managed Care – PPO | Admitting: Psychiatry

## 2013-07-23 VITALS — BP 132/88 | HR 85 | Ht 67.0 in | Wt 168.6 lb

## 2013-07-23 DIAGNOSIS — F3342 Major depressive disorder, recurrent, in full remission: Secondary | ICD-10-CM

## 2013-07-23 DIAGNOSIS — F329 Major depressive disorder, single episode, unspecified: Secondary | ICD-10-CM

## 2013-07-23 DIAGNOSIS — F339 Major depressive disorder, recurrent, unspecified: Secondary | ICD-10-CM

## 2013-07-23 MED ORDER — FLUVOXAMINE MALEATE 50 MG PO TABS: 100.0000 mg | ORAL_TABLET | Freq: Every morning | ORAL | Status: DC

## 2013-07-23 MED ORDER — RISPERIDONE 0.25 MG PO TABS: 0.2500 mg | ORAL_TABLET | Freq: Every day | ORAL | Status: DC

## 2013-07-23 NOTE — Progress Notes (Signed)
[facility] MD Progress Note  07/23/2013 2:22 PM Jessica [name]  MRN:  [id number] Subjective:  Feeling well Patient is seen one time in the same alone. The patient is doing better now. She says she feels much better not feeling depressed or anxious. She seems somewhat less preoccupied with her breath. The patient is busy taking care of her granddaughter who is 42 years old. The patient is sleeping and eating well she is good concentration. She denies problems with her work. She has good energy. She enjoys watching TV but does not exercise enough. The patient denies counting checking or cleaning. He denies any psychotic symptomatology. The patient denies being suicidal. When she was seen at this facility she was in the IOP program for depression and suicidal ideation. Her last visit because she had somewhat of a decline we restarted her Risperdal 0.25 mg. Today she shares that since being on Risperdal she's gained over 20 pounds. While she's upset with learning are that might be related to the Risperdal she is very resistant to changing the Risperdal. The patient says she does not want to go backwards. She agreed to give it another 3 months. She goes to therapy with Buena Irish regularly. At this time the patient is quite stable. Day we reviewed the pros and cons of her medications and she agreed to take them as prescribed. The patient denies any physical complaints at this time. She denies any neurological complaints. Diagnosis:   DSM5: Schizophrenia Disorders:   Obsessive-Compulsive Disorders:   Trauma-Stressor Disorders:   Substance/Addictive Disorders:   Depressive Disorders:  Major Depressive Disorder - Mild (296.21)  Axis I: Major Depression, single episode  ADL's:  Intact  Sleep: Good  Appetite:  Good  Suicidal Ideation:  no Homicidal Ideation:  no AEB (as evidenced by):  Psychiatric Specialty Exam: ROS  Blood pressure 132/88, pulse 85, height 5\' 7"  (1.702 m), weight 168 lb 9.6 oz (76.476  kg).Body mass index is 26.4 kg/(m^2).  General Appearance: Casual  Eye Contact::  Good  Speech:  Clear and Coherent  Volume:  Normal  Mood:  Euthymic  Affect:  Congruent  Thought Process:  Goal Directed  Orientation:  Full (Time, Place, and Person)  Thought Content:  WDL  Suicidal Thoughts:  No  Homicidal Thoughts:  No  Memory:  NA  Judgement:  Good  Insight:  Good  Psychomotor Activity:  Normal  Concentration:  Good  Recall:  Good  Akathisia:  No  Handed:  Right  AIMS (if indicated):     Assets:  Desire for Improvement  Sleep:      Current Medications: Current Outpatient Prescriptions  Medication Sig Dispense Refill  . fluvoxaMINE (LUVOX) 50 MG tablet Take 1 tablet (50 mg total) by mouth every morning. 2 qam  60 tablet  5  . fluvoxaMINE (LUVOX) 50 MG tablet Take 2 tablets (100 mg total) by mouth every morning. 2 qam  30 tablet  5  . Multiple Vitamin (MULTIVITAMIN) tablet Take 1 tablet by mouth daily.      . risperiDONE (RISPERDAL) 0.25 MG tablet Take 1 tablet (0.25 mg total) by mouth at bedtime. 1 qhs  90 tablet  2   No current facility-administered medications for this visit.    Lab Results: No results found for this or any previous visit (from the past 48 hour(s)).  Physical Findings: AIMS:  , ,  ,  ,    CIWA:    COWS:     Treatment Plan Summary: At this  time she should continue taking Risperdal 0.25 mg every night. She'll also continue Luvox 100 mg. Patient will stay in therapy and return to see me in 3 months. It should be noted the patient does not have diabetes nor does she have high cholesterol. Today she had a AIM scale that showed no evidence of tardive dyskinesia or EPS.  This patient to return to see me in 3 months.  Plan:  Medical Decision Making Problem Points:  Established problem, worsening (2) Data Points:  Review of medication regiment & side effects (2)  I certify that inpatient services furnished can reasonably be expected to improve the  patient's condition.   Jessica [name] 07/23/2013, 2:22 PM

## 2013-08-09 ENCOUNTER — Ambulatory Visit (INDEPENDENT_AMBULATORY_CARE_PROVIDER_SITE_OTHER): Payer: BC Managed Care – PPO

## 2013-08-09 DIAGNOSIS — Z23 Encounter for immunization: Secondary | ICD-10-CM

## 2013-10-20 ENCOUNTER — Other Ambulatory Visit (HOSPITAL_COMMUNITY): Payer: Self-pay | Admitting: *Deleted

## 2013-10-20 NOTE — Telephone Encounter (Signed)
See contact note 

## 2013-10-22 ENCOUNTER — Ambulatory Visit (INDEPENDENT_AMBULATORY_CARE_PROVIDER_SITE_OTHER): Payer: BC Managed Care – PPO | Admitting: Psychiatry

## 2013-10-22 DIAGNOSIS — F429 Obsessive-compulsive disorder, unspecified: Secondary | ICD-10-CM

## 2013-10-22 DIAGNOSIS — F3342 Major depressive disorder, recurrent, in full remission: Secondary | ICD-10-CM

## 2013-10-22 MED ORDER — RISPERIDONE 0.25 MG PO TABS: 0.2500 mg | ORAL_TABLET | Freq: Every day | ORAL | Status: DC

## 2013-10-22 MED ORDER — FLUVOXAMINE MALEATE 50 MG PO TABS: 100.0000 mg | ORAL_TABLET | Freq: Every morning | ORAL | Status: DC

## 2013-10-22 NOTE — Progress Notes (Signed)
[facility] MD Progress Note  10/22/2013 3:31 PM Jessica [name]  MRN:  [id number] Subjective: Feels well Today the patient is seen one time. The patient is doing very well. She feels much less anxious. She is sleeping and eating well. She is able to concentrate. Unfortunately she no longer can afford her therapist but may go back next year. The patient says she is less concerned about her breath. She says she's talking more and feels more outgoing. She continues to go to the gym and continues to lose little weight. The patient is doing well with her husband. Life seems better. She's handling the holidays fairly well. This is the case even though her mother died this year her father died a few years ago and her uncle died recently. The patient still feels positive about the future and seems less obsessive and anxious. Diagnosis:   DSM5: Schizophrenia Disorders:   Obsessive-Compulsive Disorders:   Trauma-Stressor Disorders:   Substance/Addictive Disorders:   Depressive Disorders:    Axis I: Obsessive Compulsive Disorder  ADL's:  Intact  Sleep: Good  Appetite:  Good  Suicidal Ideation:  no Homicidal Ideation:  no AEB (as evidenced by):  Psychiatric Specialty Exam: ROS  There were no vitals taken for this visit.There is no weight on file to calculate BMI.  General Appearance: Casual  Eye Contact::  Good  Speech:  Clear and Coherent  Volume:  Normal  Mood:  Euthymic  Affect:  Congruent  Thought Process:  Disorganized  Orientation:  nl  Thought Content:  WDL  Suicidal Thoughts:  No  Homicidal Thoughts:  No  Memory:  nl  Judgement:    Insight:  Good  Psychomotor Activity:  Normal and EPS  Concentration:  Good  Recall:  Good  Akathisia:  No  Handed:  Right  AIMS (if indicated):     Assets:  Desire for Improvement Housing Resilience  Sleep:      Current Medications: Current Outpatient Prescriptions  Medication Sig Dispense Refill  . fluvoxaMINE (LUVOX) 50 MG tablet Take  2 tablets (100 mg total) by mouth every morning. 2 qam  30 tablet  5  . Multiple Vitamin (MULTIVITAMIN) tablet Take 1 tablet by mouth daily.      . risperiDONE (RISPERDAL) 0.25 MG tablet Take 1 tablet (0.25 mg total) by mouth at bedtime. 1 qhs  90 tablet  2   No current facility-administered medications for this visit.    Lab Results: No results found for this or any previous visit (from the past 48 hour(s)).  Physical Findings: AIMS:  , ,  ,  ,    CIWA:    COWS:     Treatment Plan Summary: This patient is actually doing fairly well. Anxiety is better she doesn't think that she has bad breath. She is more confident and more socially active. At this time she'll continue on low-dose Risperdal 0.25 mg and Luvox 100 mg. The patient is having no side effects from these medications the patient be reevaluated in 4 months. This is a patient that we try to reduce her Risperdal and the past and were Luvox and we got in trouble. This patient is doing very well.   Plan:  Medical Decision Making Problem Points:  Established problem, stable/improving (1) Data Points:  Review of medication regiment & side effects (2)  I certify that inpatient services furnished can reasonably be expected to improve the patient's condition.   Vasilios Ottaway IRVING 10/22/2013, 3:31 PM

## 2013-11-03 ENCOUNTER — Telehealth (HOSPITAL_COMMUNITY): Payer: Self-pay | Admitting: *Deleted

## 2013-11-03 DIAGNOSIS — F3342 Major depressive disorder, recurrent, in full remission: Secondary | ICD-10-CM

## 2013-11-03 NOTE — Telephone Encounter (Signed)
Called stating her insurance will not pay for Luvox 2 pills a day, states [facility] gave it to her for a one time thing but will need it changed before next refill. Asking that she be given 100mg  qday instead of 50mg  BID and hoping her insurance will pay for it. Wants Dr. Donell Beers to be aware.

## 2013-11-04 MED ORDER — FLUVOXAMINE MALEATE 100 MG PO TABS: 100.0000 mg | ORAL_TABLET | Freq: Every morning | ORAL | Status: DC

## 2013-11-04 NOTE — Telephone Encounter (Signed)
Dr. Rutherford Limerick reviewed request to change from 2- 50 mg Luvox to 1- 100 mg luvox in Dr. Caprice Renshaw absence.She authorized change.

## 2013-11-04 NOTE — Addendum Note (Signed)
Addended by: Tonny Bollman on: 11/04/2013 05:11 PM   Modules accepted: Orders

## 2014-01-27 ENCOUNTER — Other Ambulatory Visit (HOSPITAL_COMMUNITY): Payer: Self-pay | Admitting: *Deleted

## 2014-01-27 DIAGNOSIS — F3342 Major depressive disorder, recurrent, in full remission: Secondary | ICD-10-CM

## 2014-01-27 MED ORDER — FLUVOXAMINE MALEATE 100 MG PO TABS: 100.0000 mg | ORAL_TABLET | Freq: Every morning | ORAL | Status: DC

## 2014-01-27 NOTE — Telephone Encounter (Signed)
Called pharmacy to authorize 90 day RX.Requested that any refills from previous prescription be discontinued

## 2014-02-18 ENCOUNTER — Ambulatory Visit (INDEPENDENT_AMBULATORY_CARE_PROVIDER_SITE_OTHER): Payer: BC Managed Care – PPO | Admitting: Psychiatry

## 2014-02-18 VITALS — BP 127/75 | HR 95 | Ht 67.0 in | Wt 172.6 lb

## 2014-02-18 DIAGNOSIS — F429 Obsessive-compulsive disorder, unspecified: Secondary | ICD-10-CM

## 2014-02-18 DIAGNOSIS — F3342 Major depressive disorder, recurrent, in full remission: Secondary | ICD-10-CM

## 2014-02-18 MED ORDER — FLUVOXAMINE MALEATE 100 MG PO TABS: 100.0000 mg | ORAL_TABLET | Freq: Every morning | ORAL | Status: DC

## 2014-02-18 MED ORDER — RISPERIDONE 0.25 MG PO TABS: 0.2500 mg | ORAL_TABLET | Freq: Every day | ORAL | Status: DC

## 2014-02-18 NOTE — Progress Notes (Signed)
[facility] MD Progress Note  02/18/2014 2:37 PM KARRIE FLUELLEN  MRN:  [id number] Subjective:  Doing well At this time the patient feels better. She feels more confident. Her issues of bad breath or minimal. At the close of value H. and it is evident that this symptom was a way of her to avoid being in public and being with other people. Now she acknowledges she feels uncomfortable in the public. She doesn't have social phobia which has a low self concept. The patient denies being depressed. She is sleeping and eating very well. Her energy level is good. She's gained a little weight. She's able to think and concentrate without problems. She does not use any alcohol. She enjoys spending time with her dog and also HER-43-year-old granddaughter. She watches TV. She is stable relationship with her husband who drives a truck and has gone much of the week. 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 The patient is thinking about going back to college. They were view the pros and cons of her medication and it seemed to be evident at her Risperdal and her Luvox are very helpful and that we made attempts to reduce them she's gotten worse. At this time she shows no evidence of tardive dyskinesia. She had an aims scale that was negative. She shows no evidence of EPS. The patient is very healthy. Her OCD symptoms are minimal. Diagnosis:   DSM5: Schizophrenia Disorders:   Obsessive-Compulsive Disorders:   Trauma-Stressor Disorders:   Substance/Addictive Disorders:   Depressive Disorders:   Total Time spent with patient:  OCD   ADL's:  Intact  Sleep: Good  Appetite:  Good  Suicidal Ideation:  no Homicidal Ideation:  none AEB (as evidenced by):  Psychiatric Specialty Exam: Physical Exam  ROS  Blood pressure 127/75, pulse 95, height _0  (1.702 m), weight 172 lb 9.6 oz (78.291 kg).Body mass index is 27.03 kg/(m^2).  General Appearance:  Casual  Eye Contact::  Good  Speech:  Clear and Coherent  Volume:  Normal  Mood:  Euthymic  Affect:  Appropriate  Thought Process:  Coherent  Orientation:  Full (Time, Place, and Person)  Thought Content:  NA  Suicidal Thoughts:  No  Homicidal Thoughts:  No  Memory:  NA  Judgement:  Good  Insight:  Good  Psychomotor Activity:  Normal  Concentration:  Good  Recall:  Good  Fund of Knowledge:Good  Language: Good  Akathisia:  No  Handed:  Right  AIMS (if indicated):     Assets:  Desire for Improvement  Sleep:      Musculoskeletal: Strength & Muscle Tone:  Gait & Station:  Patient leans:   Current Medications: Current Outpatient Prescriptions  Medication Sig Dispense Refill  . fluvoxaMINE (LUVOX) 100 MG tablet Take 1 tablet (100 mg total) by mouth every morning.  90 tablet  1  . Multiple Vitamin (MULTIVITAMIN) tablet Take 1 tablet by mouth daily.      . risperiDONE (RISPERDAL) 0.25 MG tablet Take 1 tablet (0.25 mg total) by mouth at bedtime. 1 qhs  90 tablet  2   No current facility-administered medications for this visit.    Lab Results: No results found for this or any previous visit (from the past 48 hour(s)).  Physical Findings: AIMS:  , ,  ,  ,    CIWA:    COWS:     Treatment Plan Summary: At this  time the patient will continue low-dose Risperdal 0.25 mg at night. 2 continue taking Luvox 100 mg. She's on no other medications. The patient is very medically well. This patient she'll be reevaluated in 4 months. Today we talked about her going back to her therapist Mr. Bobb myelin. She claims she cannot afford him but today we discussed how she needed to see him only a few more times to try to reconnect with the general population. The patient has a low self esteem and lacks confidence. Overall though her symptoms are much improved. I think the patient is ready to move to the next step of being healthy.  Plan:  Medical Decision Making Problem Points:  Established  problem, stable/improving (1) Data Points:  Review of medication regiment & side effects (2)  I certify that inpatient services furnished can reasonably be expected to improve the patient's condition.   Norma Fredrickson 02/18/2014, 2:37 PM

## 2014-06-24 ENCOUNTER — Ambulatory Visit (INDEPENDENT_AMBULATORY_CARE_PROVIDER_SITE_OTHER): Payer: BC Managed Care – PPO | Admitting: Psychiatry

## 2014-06-24 DIAGNOSIS — F3342 Major depressive disorder, recurrent, in full remission: Secondary | ICD-10-CM

## 2014-06-24 DIAGNOSIS — F429 Obsessive-compulsive disorder, unspecified: Secondary | ICD-10-CM

## 2014-06-24 MED ORDER — RISPERIDONE 0.25 MG PO TABS
0.2500 mg | ORAL_TABLET | Freq: Every day | ORAL | Status: DC
Start: 2014-06-24 — End: 2014-11-25

## 2014-06-24 MED ORDER — FLUVOXAMINE MALEATE 100 MG PO TABS: 100.0000 mg | ORAL_TABLET | Freq: Every morning | ORAL | Status: DC

## 2014-06-24 NOTE — Progress Notes (Signed)
[facility] MD Progress Note  06/24/2014 1:24 PM Jessica [name]  MRN:  [id number] Subjective:  Has a cold The patient is emotionally doing well. She says her mood is actually better. She's more active. She spends a lot of time with her young daughter going to the gym and walking. The patient has minimal social anxiety. She was able to the beach with her family and did fairly well. She had some questions about what she was saying what she was doing but for the most part way through the motions and felt good about how she did. The patient is sleeping well and eating well. She's got good energy. She enjoys life. She doesn't read but she watches TV. The patient drinks no alcohol. The patient has decided not to go back to the therapist Mr. Mikki Santee Mylan. For now she feels Risperdal and Luvox are helpful. The patient demonstrates no tardive dyskinesia. She notes no evidence of EPS. Generally the patient is doing very well. Diagnosis:   DSM5: Schizophrenia Disorders:   Obsessive-Compulsive Disorders:   Trauma-Stressor Disorders:   Substance/Addictive Disorders:   Depressive Disorders:   Total Time spent with patient:   Axis I: Obsessive Compulsive Disorder  ADL's:  Intact   Sleep: Good  Appetite:  Good  Suicidal Ideation:  no Homicidal Ideation:  none AEB (as evidenced by):  Psychiatric Specialty Exam: Physical Exam  ROS  There were no vitals taken for this visit.There is no weight on file to calculate BMI.  General Appearance: Casual  Eye Contact::  Good  Speech:  Clear and Coherent  Volume:  Normal  Mood:  Euthymic  Affect:  Congruent  Thought Process:  NA  Orientation:  Full (Time, Place, and Person)  Thought Content:  WDL  Suicidal Thoughts:  No  Homicidal Thoughts:  No  Memory:  NA  Judgement:  Good  Insight:  Good  Psychomotor Activity:  Normal  Concentration:  Good  Recall:  Good  Fund of Knowledge:Good  Language: Good  Akathisia:  No  Handed:  Right  AIMS (if indicated):      Assets:  Desire for Improvement  Sleep:      Musculoskeletal: Strength & Muscle Tone:  Gait & Station:  Patient leans:   Current Medications: Current Outpatient Prescriptions  Medication Sig Dispense Refill  . fluvoxaMINE (LUVOX) 100 MG tablet Take 1 tablet (100 mg total) by mouth every morning.  90 tablet  1  . Multiple Vitamin (MULTIVITAMIN) tablet Take 1 tablet by mouth daily.      . risperiDONE (RISPERDAL) 0.25 MG tablet Take 1 tablet (0.25 mg total) by mouth at bedtime. 1 qhs  90 tablet  2   No current facility-administered medications for this visit.    Lab Results: No results found for this or any previous visit (from the past 48 hour(s)).  Physical Findings: AIMS:  , ,  ,  ,    CIWA:    COWS:     Treatment Plan Summary: Today the patient is doing well. We'll go ahead and continue her Risperdal 0.25 mg and her Luvox 100 mg. It should be noted in the last year or 2 with attempts to reduce or discontinue these agents the patient became very symptomatic. Gender status the pros and cons of these medication and is willing to continue taking them as prescribed. At this time the patient will avoid therapy and will in fact see me again in 5 months. Plan:  Medical Decision Making Problem Points:   Data  Points:  Review of medication regiment & side effects (2) I certify that inpatient services furnished can reasonably be expected to improve the patient's condition.   Bowe Sidor, [place] 06/24/2014, 1:24 PM

## 2014-06-25 ENCOUNTER — Ambulatory Visit (INDEPENDENT_AMBULATORY_CARE_PROVIDER_SITE_OTHER): Payer: BC Managed Care – PPO | Admitting: Physician Assistant

## 2014-06-25 VITALS — BP 118/68 | HR 104 | Temp 99.5°F | Resp 16 | Ht 66.5 in | Wt 170.0 lb

## 2014-06-25 DIAGNOSIS — Z23 Encounter for immunization: Secondary | ICD-10-CM

## 2014-06-25 DIAGNOSIS — J22 Unspecified acute lower respiratory infection: Secondary | ICD-10-CM

## 2014-06-25 DIAGNOSIS — R05 Cough: Secondary | ICD-10-CM

## 2014-06-25 DIAGNOSIS — R059 Cough, unspecified: Secondary | ICD-10-CM

## 2014-06-25 DIAGNOSIS — J988 Other specified respiratory disorders: Secondary | ICD-10-CM

## 2014-06-25 MED ORDER — AZITHROMYCIN 250 MG PO TABS: ORAL_TABLET | ORAL | Status: DC

## 2014-06-25 MED ORDER — HYDROCOD POLST-CHLORPHEN POLST 10-8 MG/5ML PO LQCR: 5.0000 mL | Freq: Two times a day (BID) | ORAL | Status: DC | PRN

## 2014-06-25 MED ORDER — PREDNISONE 20 MG PO TABS: ORAL_TABLET | ORAL | Status: DC

## 2014-06-25 NOTE — Progress Notes (Signed)
   Subjective:    Patient ID: CARLETTA FEASEL, female    DOB: [birth year], 43 y.o.   MRN: [id number]  HPI  43 year old female presents for evaluation of 3 week history of cough. Symptoms started while she was on vacation at the beach.  States she is "coughing constantly" but it is dry and hacking. Reports she is not getting any mucous up.  Admits to some PND but no significant amount of nasal congestion, sinus pain, headache, otalgia, dizziness, sore throat, SOB, wheezing, fever, or chills.  She has tried Robitussin, Nyquil, Delsym, and cough drops - not helping much Patient is otherwise doing well with no other concerns today.     Review of Systems  Constitutional: Negative for fever and chills.  HENT: Positive for postnasal drip. Negative for congestion, sinus pressure and sore throat.   Respiratory: Positive for cough. Negative for chest tightness, shortness of breath and wheezing.   Cardiovascular: Negative for chest pain.  Gastrointestinal: Negative for nausea and vomiting.  Neurological: Negative for dizziness and headaches.       Objective:   Physical Exam  Constitutional: She is oriented to person, place, and time. She appears well-developed and well-nourished.  HENT:  Head: Normocephalic and atraumatic.  Right Ear: Hearing, tympanic membrane, external ear and ear canal normal.  Left Ear: Hearing, tympanic membrane, external ear and ear canal normal.  Mouth/Throat: Uvula is midline, oropharynx is clear and moist and mucous membranes are normal.  Eyes: Conjunctivae are normal.  Neck: Normal range of motion. Neck supple.  Cardiovascular: Normal rate, regular rhythm and normal heart sounds.   Pulmonary/Chest: Effort normal and breath sounds normal.  Lymphadenopathy:    She has no cervical adenopathy.  Neurological: She is alert and oriented to person, place, and time.  Psychiatric: She has a normal mood and affect. Her behavior is normal. Judgment and thought content normal.            Assessment & Plan:  Cough - Plan: predniSONE (DELTASONE) 20 MG tablet, chlorpheniramine-HYDROcodone (TUSSIONEX PENNKINETIC ER) 10-8 MG/5ML LQCR, azithromycin (ZITHROMAX) 250 MG tablet  Lower respiratory infection (e.g., bronchitis, pneumonia, pneumonitis, pulmonitis) - Plan: predniSONE (DELTASONE) 20 MG tablet, chlorpheniramine-HYDROcodone (TUSSIONEX PENNKINETIC ER) 10-8 MG/5ML LQCR, azithromycin (ZITHROMAX) 250 MG tablet  Need for Tdap vaccination - Plan: Tdap vaccine greater than or equal to 7yo IM  Lungs are clear and patient afebriile, but due to length of illness will cover with Zpack Will also treat with short burst of prednisone. Patient reports she has done well with prednisone in the past Tussionex qhs prn cough RTC precautions discussed. F/u if symptoms worsening or fail to improve.

## 2014-06-26 ENCOUNTER — Encounter: Payer: Self-pay | Admitting: Physician Assistant

## 2014-07-12 ENCOUNTER — Ambulatory Visit (INDEPENDENT_AMBULATORY_CARE_PROVIDER_SITE_OTHER): Payer: BC Managed Care – PPO | Admitting: Internal Medicine

## 2014-07-12 ENCOUNTER — Other Ambulatory Visit: Payer: Self-pay | Admitting: Internal Medicine

## 2014-07-12 VITALS — BP 98/62 | HR 96 | Temp 98.4°F | Resp 16 | Ht 67.0 in | Wt 167.1 lb

## 2014-07-12 DIAGNOSIS — E041 Nontoxic single thyroid nodule: Secondary | ICD-10-CM

## 2014-07-12 DIAGNOSIS — Z13220 Encounter for screening for lipoid disorders: Secondary | ICD-10-CM

## 2014-07-12 DIAGNOSIS — N951 Menopausal and female climacteric states: Secondary | ICD-10-CM

## 2014-07-12 DIAGNOSIS — R232 Flushing: Secondary | ICD-10-CM

## 2014-07-12 DIAGNOSIS — Z0000 Encounter for general adult medical examination without abnormal findings: Secondary | ICD-10-CM

## 2014-07-12 DIAGNOSIS — G44219 Episodic tension-type headache, not intractable: Secondary | ICD-10-CM

## 2014-07-12 DIAGNOSIS — Z1239 Encounter for other screening for malignant neoplasm of breast: Secondary | ICD-10-CM

## 2014-07-12 DIAGNOSIS — Z8249 Family history of ischemic heart disease and other diseases of the circulatory system: Secondary | ICD-10-CM

## 2014-07-12 LAB — COMPREHENSIVE METABOLIC PANEL
ALBUMIN: 3.7 g/dL (ref 3.5–5.2)
ALT: 11 U/L (ref 0–35)
AST: 14 U/L (ref 0–37)
Alkaline Phosphatase: 75 U/L (ref 39–117)
BUN: 8 mg/dL (ref 6–23)
CALCIUM: 9.3 mg/dL (ref 8.4–10.5)
CHLORIDE: 101 meq/L (ref 96–112)
CO2: 26 meq/L (ref 19–32)
CREATININE: 0.95 mg/dL (ref 0.50–1.10)
Glucose, Bld: 88 mg/dL (ref 70–99)
POTASSIUM: 4.6 meq/L (ref 3.5–5.3)
Sodium: 138 mEq/L (ref 135–145)
Total Bilirubin: 0.5 mg/dL (ref 0.2–1.2)
Total Protein: 8 g/dL (ref 6.0–8.3)

## 2014-07-12 LAB — CBC WITH DIFFERENTIAL/PLATELET
Basophils Absolute: 0 10*3/uL (ref 0.0–0.1)
Basophils Relative: 0 % (ref 0–1)
EOS PCT: 0 % (ref 0–5)
Eosinophils Absolute: 0 10*3/uL (ref 0.0–0.7)
HCT: 29.1 % — ABNORMAL LOW (ref 36.0–46.0)
Hemoglobin: 9.5 g/dL — ABNORMAL LOW (ref 12.0–15.0)
LYMPHS ABS: 2 10*3/uL (ref 0.7–4.0)
Lymphocytes Relative: 18 % (ref 12–46)
MCH: 22.1 pg — ABNORMAL LOW (ref 26.0–34.0)
MCHC: 32.6 g/dL (ref 30.0–36.0)
MCV: 67.7 fL — AB (ref 78.0–100.0)
Monocytes Absolute: 1.1 10*3/uL — ABNORMAL HIGH (ref 0.1–1.0)
Monocytes Relative: 10 % (ref 3–12)
Neutro Abs: 8 10*3/uL — ABNORMAL HIGH (ref 1.7–7.7)
Neutrophils Relative %: 72 % (ref 43–77)
Platelets: 448 10*3/uL — ABNORMAL HIGH (ref 150–400)
RBC: 4.3 MIL/uL (ref 3.87–5.11)
RDW: 15.8 % — ABNORMAL HIGH (ref 11.5–15.5)
WBC: 11.1 10*3/uL — AB (ref 4.0–10.5)

## 2014-07-12 LAB — T4, FREE: FREE T4: 1.34 ng/dL (ref 0.80–1.80)

## 2014-07-12 LAB — LIPID PANEL
CHOL/HDL RATIO: 4.6 ratio
CHOLESTEROL: 223 mg/dL — AB (ref 0–200)
HDL: 49 mg/dL (ref >39)
LDL Cholesterol: 163 mg/dL — ABNORMAL HIGH (ref 0–99)
Triglycerides: 56 mg/dL (ref <150)
VLDL: 11 mg/dL (ref 0–40)

## 2014-07-12 LAB — TSH: TSH: 0.232 u[IU]/mL — ABNORMAL LOW (ref 0.350–4.500)

## 2014-07-12 NOTE — Progress Notes (Signed)
Subjective:    Patient ID: Jessica [name], female    DOB: December 30, 1970, 43 y.o.   MRN: [id number]  HPI  Chief Complaint  Patient presents with  . Annual Exam    pap smear    She has several questions Intermittent hot flashes day and night for the past 3 months Daily headaches over the last 3-4 weeks which are bifrontal, slow in onset without aura, not involving vision dizziness or nausea, responding to ibuprofen, settling into the right ear and right neck at the end of the day and overnight No hearing loss/teeth problems or sore throat Has a persistent cough for the last week following treatment for respiratory infection for 6 weeks ago that resolved with treatment//nonproductive, no fever, no shortness of breath  Immunizations up to date No Pap smear since 2008 No mammogram No family history of breast cancer Married 2 children 23 and 14 Menses remain regular  Review of Systems Review of systems pink sheet negative except for the above and recent fatigue. Recent runny nose with slight allergic symptoms. Increase urinary frequency for one to 2 weeks which resolved in recent weeks. Continue problems with mood disorder currently treated by a psychiatrist although better sleep recently and more stable on medication Risperdal and Luvox    Objective:   Physical Exam  Nursing note and vitals reviewed. Constitutional: She is oriented to person, place, and time. She appears well-developed and well-nourished. No distress.  HENT:  Head: Normocephalic.  Right Ear: External ear normal.  Left Ear: External ear normal.  Nose: Nose normal.  Mouth/Throat: Oropharynx is clear and moist.  Eyes: Conjunctivae and EOM are normal. Pupils are equal, round, and reactive to light.  Neck: Normal range of motion. Neck supple. No thyromegaly present.  Thyroid has multiple nodules and on the right these are tender  Cardiovascular: Normal rate, regular rhythm, normal heart sounds and intact distal pulses.     No murmur heard. Pulmonary/Chest: Effort normal and breath sounds normal. She has no wheezes.  Breasts have no palpable masses or nipple discharge  Abdominal: Soft. Bowel sounds are normal. She exhibits no distension and no mass. There is no tenderness. There is no rebound.  Genitourinary:  Introitus clear os clear\ Uterus mid position nontender No adnexal masses or tenderness  Musculoskeletal: Normal range of motion. She exhibits no edema and no tenderness.  Lymphadenopathy:    She has no cervical adenopathy.  Neurological: She is alert and oriented to person, place, and time. She has normal reflexes. No cranial nerve deficit. She exhibits normal muscle tone. Coordination normal.  Skin: Skin is warm and dry.  Psychiatric: She has a normal mood and affect. Her behavior is normal. Judgment and thought content normal.   BP 98/62  Pulse 96  Temp(Src) 98.4 F (36.9 C) (Oral)  Resp 16  Ht _0  (1.702 m)  Wt 167 lb 2 oz (75.807 kg)  BMI 26.17 kg/m2  SpO2 100%  LMP 06/18/2014     Assessment & Plan:  Routine general medical examination at a health care facility - Plan: Pap IG and HPV (high risk) DNA detection,   Mammogram Hot flashes - Plan: CBC with Differential, Comprehensive metabolic panel, TSH, T4, free  Episodic tension-type headache, not intractable  Continue Avapro for when necessary for now Screening cholesterol level - Plan: Lipid panel Family history of ASCVD - Plan: Lipid panel  Mother died of massive heart attack age 66 Psychiatric problem stable  Thyroid nodules- we'll ultrasound after labs-  Microcytic  anemia   Addend- Results for orders placed in visit on 07/12/14  CBC WITH DIFFERENTIAL      Result Value Ref Range   WBC 11.1 (*) 4.0 - 10.5 K/uL   RBC 4.30  3.87 - 5.11 MIL/uL   Hemoglobin---was 12.6 in 2010 9.5 (*) 12.0 - 15.0 g/dL   HCT 29.1 (*) 36.0 - 46.0 %   MCV 67.7 (*) 78.0 - 100.0 fL   MCH 22.1 (*) 26.0 - 34.0 pg   MCHC 32.6  30.0 - 36.0 g/dL    RDW 15.8 (*) 11.5 - 15.5 %   Platelets 448 (*) 150 - 400 K/uL   Neutrophils Relative % 72  43 - 77 %   Neutro Abs 8.0 (*) 1.7 - 7.7 K/uL   Lymphocytes Relative 18  12 - 46 %   Lymphs Abs 2.0  0.7 - 4.0 K/uL   Monocytes Relative 10  3 - 12 %   Monocytes Absolute 1.1 (*) 0.1 - 1.0 K/uL   Eosinophils Relative 0  0 - 5 %   Eosinophils Absolute 0.0  0.0 - 0.7 K/uL   Basophils Relative 0  0 - 1 %   Basophils Absolute 0.0  0.0 - 0.1 K/uL   Smear Review Criteria for review not met    COMPREHENSIVE METABOLIC PANEL      Result Value Ref Range   Sodium 138  135 - 145 mEq/L   Potassium 4.6  3.5 - 5.3 mEq/L   Chloride 101  96 - 112 mEq/L   CO2 26  19 - 32 mEq/L   Glucose, Bld 88  70 - 99 mg/dL   BUN 8  6 - 23 mg/dL   Creat 0.95  0.50 - 1.10 mg/dL   Total Bilirubin 0.5  0.2 - 1.2 mg/dL   Alkaline Phosphatase 75  39 - 117 U/L   AST 14  0 - 37 U/L   ALT 11  0 - 35 U/L   Total Protein 8.0  6.0 - 8.3 g/dL   Albumin 3.7  3.5 - 5.2 g/dL   Calcium 9.3  8.4 - 10.5 mg/dL  LIPID PANEL      Result Value Ref Range   Cholesterol 223 (*) 0 - 200 mg/dL   Triglycerides 56  <150 mg/dL   HDL 49  >39 mg/dL   Total CHOL/HDL Ratio 4.6     VLDL 11  0 - 40 mg/dL   LDL Cholesterol 163 (*) 0 - 99 mg/dL  TSH      Result Value Ref Range   TSH 0.232 (*) 0.350 - 4.500 uIU/mL  T4, FREE      Result Value Ref Range   Free T4 1.34  0.80 - 1.80 ng/dL  PAP IG AND HPV HIGH-RISK      Result Value Ref Range    pending                          Adding FE TIBC With her family history we should treat her abnormal LDL with medication once thyroid issues are stabilized

## 2014-07-14 LAB — IRON AND TIBC
%SAT: 5 % — ABNORMAL LOW (ref 20–55)
Iron: 12 ug/dL — ABNORMAL LOW (ref 42–145)
TIBC: 261 ug/dL (ref 250–470)
UIBC: 249 ug/dL (ref 125–400)

## 2014-07-14 LAB — T3, FREE: T3, Free: 3.2 pg/mL (ref 2.3–4.2)

## 2014-07-15 ENCOUNTER — Ambulatory Visit
Admission: RE | Admit: 2014-07-15 | Discharge: 2014-07-15 | Disposition: A | Discharge status: Home / Self Care | Payer: BC Managed Care – PPO | Source: Ambulatory Visit | Attending: Internal Medicine | Admitting: Internal Medicine

## 2014-07-15 ENCOUNTER — Encounter: Payer: Self-pay | Admitting: Internal Medicine

## 2014-07-15 DIAGNOSIS — E041 Nontoxic single thyroid nodule: Secondary | ICD-10-CM

## 2014-07-15 LAB — PAP IG AND HPV HIGH-RISK: HPV DNA High Risk: NOT DETECTED

## 2014-07-16 ENCOUNTER — Other Ambulatory Visit: Payer: Self-pay | Admitting: Radiology

## 2014-07-16 DIAGNOSIS — E0590 Thyrotoxicosis, unspecified without thyrotoxic crisis or storm: Secondary | ICD-10-CM

## 2014-07-17 ENCOUNTER — Other Ambulatory Visit: Payer: Self-pay | Admitting: Internal Medicine

## 2014-07-17 DIAGNOSIS — E0590 Thyrotoxicosis, unspecified without thyrotoxic crisis or storm: Secondary | ICD-10-CM

## 2014-07-29 ENCOUNTER — Encounter (HOSPITAL_COMMUNITY)
Admission: RE | Admit: 2014-07-29 | Discharge: 2014-07-29 | Disposition: A | Discharge status: Home / Self Care | Payer: BC Managed Care – PPO | Source: Ambulatory Visit | Attending: Diagnostic Radiology | Admitting: Diagnostic Radiology

## 2014-07-29 DIAGNOSIS — E049 Nontoxic goiter, unspecified: Secondary | ICD-10-CM | POA: Insufficient documentation

## 2014-07-29 DIAGNOSIS — E0590 Thyrotoxicosis, unspecified without thyrotoxic crisis or storm: Secondary | ICD-10-CM | POA: Insufficient documentation

## 2014-07-30 ENCOUNTER — Encounter (HOSPITAL_COMMUNITY)
Admission: RE | Admit: 2014-07-30 | Discharge: 2014-07-30 | Disposition: A | Discharge status: Home / Self Care | Payer: BC Managed Care – PPO | Source: Ambulatory Visit | Attending: Internal Medicine | Admitting: Internal Medicine

## 2014-07-30 DIAGNOSIS — E049 Nontoxic goiter, unspecified: Secondary | ICD-10-CM | POA: Diagnosis not present

## 2014-07-30 DIAGNOSIS — E0590 Thyrotoxicosis, unspecified without thyrotoxic crisis or storm: Secondary | ICD-10-CM | POA: Diagnosis present

## 2014-07-30 MED ORDER — SODIUM PERTECHNETATE TC 99M INJECTION
9.6000 | Freq: Once | INTRAVENOUS | Status: AC | PRN
  Administered 2014-07-30: 10 via INTRAVENOUS

## 2014-07-30 MED ORDER — SODIUM IODIDE I 131 CAPSULE
11.5000 | Freq: Once | INTRAVENOUS | Status: AC | PRN
  Administered 2014-07-30: 11.5 via ORAL

## 2014-08-04 ENCOUNTER — Ambulatory Visit
Admission: RE | Admit: 2014-08-04 | Discharge: 2014-08-04 | Disposition: A | Discharge status: Home / Self Care | Payer: BC Managed Care – PPO | Source: Ambulatory Visit | Attending: Internal Medicine | Admitting: Internal Medicine

## 2014-08-04 DIAGNOSIS — Z1239 Encounter for other screening for malignant neoplasm of breast: Secondary | ICD-10-CM

## 2014-08-26 ENCOUNTER — Ambulatory Visit (INDEPENDENT_AMBULATORY_CARE_PROVIDER_SITE_OTHER): Payer: BC Managed Care – PPO | Admitting: *Deleted

## 2014-08-26 DIAGNOSIS — Z23 Encounter for immunization: Secondary | ICD-10-CM

## 2014-11-25 ENCOUNTER — Ambulatory Visit (INDEPENDENT_AMBULATORY_CARE_PROVIDER_SITE_OTHER): Payer: BLUE CROSS/BLUE SHIELD | Admitting: Psychiatry

## 2014-11-25 VITALS — BP 134/79 | HR 75 | Ht 67.0 in | Wt 177.8 lb

## 2014-11-25 DIAGNOSIS — F42 Obsessive-compulsive disorder: Secondary | ICD-10-CM

## 2014-11-25 DIAGNOSIS — F429 Obsessive-compulsive disorder, unspecified: Secondary | ICD-10-CM

## 2014-11-25 DIAGNOSIS — F3342 Major depressive disorder, recurrent, in full remission: Secondary | ICD-10-CM

## 2014-11-25 MED ORDER — RISPERIDONE 0.25 MG PO TABS: 0.2500 mg | ORAL_TABLET | Freq: Every day | ORAL | Status: DC

## 2014-11-25 MED ORDER — FLUVOXAMINE MALEATE 100 MG PO TABS: 100.0000 mg | ORAL_TABLET | Freq: Every morning | ORAL | Status: DC

## 2014-11-25 NOTE — Progress Notes (Signed)
[facility] MD Progress Note  11/25/2014 1:27 PM Jessica [name]  MRN:  [id number] Subjective:  Feels great At this time the patient is doing well. She is no significant OCD symptomatology. She has some mild concerns about the odor of her body but this does not seem to impact her life as much. She always also thought that she had bad breath but this does not seem to blocker at this time. The patient's mood is good. She sleeping and eating well. She uses no alcohol or drugs. She takes her medications regularly. She chose not to go back to her therapist. The patient is thinking about going back to work. She's not sure what she once to do. She is to be a CNA. She is considering going back to school for something. The patient looks well. She appears very happy very even very confident. The patient shows no evidence of tardive dyskinesia. Today she had an aims scale which was negative. Today we talked about the small risks of tardive dyskinesia noted continue to monitor her for that condition should it arise. For now the patient is doing very well in past efforts to get rid of her Risperdal were Luvox to decline so we will continue it for the time being. Diagnosis:   DSM5: Schizophrenia Disorders:   Obsessive-Compulsive Disorders:  Trauma-Stressor Disorders:   Substance/Addictive Disorders:   Depressive Disorders:   Total Time spent with patient:   Axis I: Obsessive Compulsive Disorder  ADL's:  Intact  Sleep: Good  Appetite:  Good  Suicidal Ideation:   Homicidal Ideation:  no AEB (as evidenced by):  Psychiatric Specialty Exam: Physical Exam  ROS  Blood pressure 134/79, pulse 75, height 5\' 7"  (1.702 m), weight 177 lb 12.8 oz (80.65 kg).Body mass index is 27.84 kg/(m^2).  General Appearance: none  Eye Contact::  Good  Speech:  Clear and Coherent  Volume:  Normal  Mood:  Euthymic  Affect:  Appropriate  Thought Process:  Coherent  Orientation:  Full (Time, Place, and Person)  Thought Content:   WDL  Suicidal Thoughts:  No  Homicidal Thoughts:  No  Memory:  NA  Judgement:  Good  Insight:  Good  Psychomotor Activity:  Normal  Concentration:  Good  Recall:  Good  Fund of Knowledge:Good  Language: Good  Akathisia:  No  Handed:  Right  AIMS (if indicated):     Assets:  Communication Skills  Sleep:      Musculoskeletal: Strength & Muscle Tone:  Gait & Station:  Patient leans:   Current Medications: Current Outpatient Prescriptions  Medication Sig Dispense Refill  . fluvoxaMINE (LUVOX) 100 MG tablet Take 1 tablet (100 mg total) by mouth every morning. 90 tablet 3  . risperiDONE (RISPERDAL) 0.25 MG tablet Take 1 tablet (0.25 mg total) by mouth at bedtime. 1 qhs 90 tablet 3   No current facility-administered medications for this visit.    Lab Results: No results found for this or any previous visit (from the past 48 hour(s)).  Physical Findings: AIMS:  , ,  ,  ,    CIWA:    COWS:     Treatment Plan Summary: At this time the patient continue taking Risperdal 0.25 mg in the morning and Luvox 100 mg at night. She will not be seeing a therapist. This patient is doing very well. Today she understood the pros and cons of her medications and chooses to continue taking them and return to see me in 7 months.  Plan:  Medical  Decision Making Problem Points:  Established problem, stable/improving (1) Data Points:  Review of medication regiment & side effects (2)  I certify that inpatient services furnished can reasonably be expected to improve the patient's condition.   Jessica [name], [place] 11/25/2014, 1:27 PM

## 2014-11-26 ENCOUNTER — Other Ambulatory Visit (HOSPITAL_COMMUNITY): Payer: Self-pay | Admitting: Psychiatry

## 2015-01-28 NOTE — Telephone Encounter (Signed)
New orders for patient's Risperdal and Luvox were e-scribed to [facility] on [address] on 11/25/14 so declined refills at this time.

## 2015-04-07 ENCOUNTER — Ambulatory Visit (INDEPENDENT_AMBULATORY_CARE_PROVIDER_SITE_OTHER): Payer: BLUE CROSS/BLUE SHIELD | Admitting: Physician Assistant

## 2015-04-07 VITALS — BP 124/72 | HR 80 | Temp 99.1°F | Resp 16 | Ht 66.75 in | Wt 173.8 lb

## 2015-04-07 DIAGNOSIS — R221 Localized swelling, mass and lump, neck: Secondary | ICD-10-CM | POA: Diagnosis not present

## 2015-04-07 DIAGNOSIS — M542 Cervicalgia: Secondary | ICD-10-CM | POA: Diagnosis not present

## 2015-04-07 MED ORDER — DOXYCYCLINE HYCLATE 100 MG PO CAPS: 100.0000 mg | ORAL_CAPSULE | Freq: Two times a day (BID) | ORAL | Status: DC

## 2015-04-07 NOTE — Progress Notes (Signed)
Subjective:    Patient ID: Jessica [name], female    DOB: 31-Aug-1971, 44 y.o.   MRN: [id number]  Chief Complaint  Patient presents with  . Ear Pain    left ear x 1 week , noticed swollen ear lobe yesturday   . Neck Pain    behind ear , noticed last thursday    Patient Active Problem List   Diagnosis Date Noted  . Obsessive compulsive disorder 02/18/2014  . Major depressive disorder, recurrent episode, unspecified 07/23/2013  . Major depressive disorder, recurrent episode, moderate 05/14/2013  . Major depressive disorder, recurrent episode, severe, without mention of psychotic behavior 03/05/2013  . Major depressive disorder, recurrent episode, in full remission 12/04/2012  . Depression, major, recurrent, in complete remission 10/04/2012  . Social anxiety disorder 09/17/2012  . DEPRESSION 01/04/2009  . ANXIETY DISORDER, GENERALIZED 07/15/2008  . HYPERCHOLESTEROLEMIA 10/07/2007  . VITILIGO 10/07/2007  . ANEMIA, IRON DEFICIENCY, UNSPEC. 01/10/2007   Prior to Admission medications   Medication Sig Start Date End Date Taking? Authorizing Provider  fluvoxaMINE (LUVOX) 100 MG tablet Take 1 tablet (100 mg total) by mouth every morning. 11/25/14  Yes Norma Fredrickson, MD  risperiDONE (RISPERDAL) 0.25 MG tablet Take 1 tablet (0.25 mg total) by mouth at bedtime. 1 qhs 11/25/14  Yes Norma Fredrickson, MD  doxycycline (VIBRAMYCIN) 100 MG capsule Take 1 capsule (100 mg total) by mouth 2 (two) times daily. 04/07/15   Araceli Bouche, PA   Medications, allergies, past medical history, surgical history, family history, social history and problem list reviewed and updated.  HPI  44 yof presents with left ear, neck pain and swelling.   Sx started 6-7 days ago with first noticing a bump behind her left ear which was painful. This persisted for couple days then progressed to her left neck also feeling sore. For past few days she has noticed slightly more pain surrounding the bump. Also left ear lobe has  been swollen past couple days. No pain over left ear.   Has not bene camping, hiking. Does not recall insect bite. No trauma. No hx angioedema. Denies sob, fevers, chills, hearing loss, tinnitus, vertigo. No new lotions, shampoo, detergents, etc.   Review of Systems See HPI.     Objective:   Physical Exam  Constitutional: She is oriented to person, place, and time. She appears well-developed and well-nourished.  Non-toxic appearance. She does not have a sickly appearance. She does not appear ill. No distress.  BP 124/72 mmHg  Pulse 80  Temp(Src) 99.1 F (37.3 C) (Oral)  Resp 16  Ht 5' 6.75" (1.695 m)  Wt 173 lb 12.8 oz (78.835 kg)  BMI 27.44 kg/m2  SpO2 98%  LMP 03/31/2015   HENT:  Right Ear: Tympanic membrane normal.  Left Ear: Tympanic membrane normal. No lacerations. No drainage or tenderness. Tympanic membrane is not erythematous.  No middle ear effusion. No decreased hearing is noted.  Ears:  Left ear lobe swollen. Not red, not warm, no ttp.   Neck: Normal range of motion. No spinous process tenderness present. No Brudzinski's sign noted.    Swelling, slight erythema, ttp over circled area left neck. Small red papular type lesion over top of swelling. No fluctuance. No mastoid tenderness. Normal neck rom.   Lymphadenopathy:       Head (left side): No submental, no submandibular, no tonsillar, no preauricular, no posterior auricular and no occipital adenopathy present.       Left cervical: No superficial cervical and no deep cervical adenopathy  present.  Neurological: She is alert and oriented to person, place, and time.      Assessment & Plan:   44 yof presents with left ear, neck pain and swelling.   Localized swelling, mass or lump of neck - Plan: doxycycline (VIBRAMYCIN) 100 MG capsule Neck pain on left side --normal exam --> doubt om, oe, mastoiditis, meningitis, no LAN --no fluctuance to suggest abscess --pain, swelling, erythema likely secondary to possible  insect bite/cellulitis --doxy 10 days bid, benadryl prn --rtc if no improvement 48 hrs  [name], PA-C Physician Assistant-Certified [facility]  04/07/2015 10:12 AM

## 2015-04-07 NOTE — Patient Instructions (Signed)
Your swelling could be some cellulitis or possibly inflammation from an insect bite or abrasion. Please take the doxycycline twice daily for 10 days.  Please take benadryl as needed for swelling.  Please come back to see [place] Friday if you're not improving or are worse.

## 2015-06-30 ENCOUNTER — Ambulatory Visit (HOSPITAL_COMMUNITY): Payer: Self-pay | Admitting: Psychiatry

## 2015-07-05 ENCOUNTER — Other Ambulatory Visit: Payer: Self-pay

## 2015-07-05 DIAGNOSIS — Z1231 Encounter for screening mammogram for malignant neoplasm of breast: Secondary | ICD-10-CM

## 2015-07-30 ENCOUNTER — Ambulatory Visit (INDEPENDENT_AMBULATORY_CARE_PROVIDER_SITE_OTHER): Payer: BLUE CROSS/BLUE SHIELD | Admitting: Internal Medicine

## 2015-07-30 ENCOUNTER — Encounter: Payer: Self-pay | Admitting: Internal Medicine

## 2015-07-30 VITALS — BP 126/76 | HR 75 | Temp 98.5°F | Resp 16 | Ht 66.5 in | Wt 166.0 lb

## 2015-07-30 DIAGNOSIS — E785 Hyperlipidemia, unspecified: Secondary | ICD-10-CM

## 2015-07-30 DIAGNOSIS — Z23 Encounter for immunization: Secondary | ICD-10-CM

## 2015-07-30 DIAGNOSIS — Z0000 Encounter for general adult medical examination without abnormal findings: Secondary | ICD-10-CM

## 2015-07-30 DIAGNOSIS — R634 Abnormal weight loss: Secondary | ICD-10-CM

## 2015-07-30 DIAGNOSIS — D649 Anemia, unspecified: Secondary | ICD-10-CM

## 2015-07-30 LAB — CBC WITH DIFFERENTIAL/PLATELET
Basophils Absolute: 0.1 10*3/uL (ref 0.0–0.1)
Basophils Relative: 1 % (ref 0–1)
EOS PCT: 2 % (ref 0–5)
Eosinophils Absolute: 0.1 10*3/uL (ref 0.0–0.7)
HEMATOCRIT: 35.2 % — AB (ref 36.0–46.0)
Hemoglobin: 11.3 g/dL — ABNORMAL LOW (ref 12.0–15.0)
LYMPHS ABS: 2.7 10*3/uL (ref 0.7–4.0)
LYMPHS PCT: 46 % (ref 12–46)
MCH: 23.1 pg — ABNORMAL LOW (ref 26.0–34.0)
MCHC: 32.1 g/dL (ref 30.0–36.0)
MCV: 71.8 fL — AB (ref 78.0–100.0)
MONO ABS: 0.5 10*3/uL (ref 0.1–1.0)
MPV: 10.8 fL (ref 8.6–12.4)
Monocytes Relative: 8 % (ref 3–12)
NEUTROS ABS: 2.5 10*3/uL (ref 1.7–7.7)
Neutrophils Relative %: 43 % (ref 43–77)
Platelets: 319 10*3/uL (ref 150–400)
RBC: 4.9 MIL/uL (ref 3.87–5.11)
RDW: 16.6 % — ABNORMAL HIGH (ref 11.5–15.5)
WBC: 5.8 10*3/uL (ref 4.0–10.5)

## 2015-07-30 LAB — LIPID PANEL
Cholesterol: 262 mg/dL — ABNORMAL HIGH (ref 125–200)
HDL: 60 mg/dL (ref >=46)
LDL CALC: 187 mg/dL — AB (ref <130)
TRIGLYCERIDES: 77 mg/dL (ref <150)
Total CHOL/HDL Ratio: 4.4 Ratio (ref <=5.0)
VLDL: 15 mg/dL (ref <30)

## 2015-07-31 LAB — T4, FREE: Free T4: 0.91 ng/dL (ref 0.80–1.80)

## 2015-07-31 LAB — TSH: TSH: 1.444 u[IU]/mL (ref 0.350–4.500)

## 2015-08-01 NOTE — Progress Notes (Signed)
Subjective:    Patient ID: Jessica [name], female    DOB: [birth year], 44 y.o.   MRN: [id number]  HPIannual Patient Active Problem List   Diagnosis Date Noted  . Obsessive compulsive disorder 02/18/2014  . Major depressive disorder, recurrent episode, unspecified 07/23/2013  . Major depressive disorder, recurrent episode, moderate 05/14/2013  . Major depressive disorder, recurrent episode, severe, without mention of psychotic behavior 03/05/2013  . Major depressive disorder, recurrent episode, in full remission 12/04/2012  . Depression, major, recurrent, in complete remission 10/04/2012  . Social anxiety disorder 09/17/2012  . DEPRESSION 01/04/2009  . ANXIETY DISORDER, GENERALIZED 07/15/2008  . HYPERCHOLESTEROLEMIA 10/07/2007  . VITILIGO 10/07/2007  . ANEMIA, IRON DEFICIENCY, UNSPEC. 01/10/2007    As the problem list indicates there has been significant psychological difficulties, however in the last year she has managed to discontinue all medication and is now doing well.Marland Kitchen Risperdal was causing significant weight gain which she has now lost at least a portion. Wt Readings from Last 3 Encounters:  07/30/15 166 lb (75.297 kg)  04/07/15 173 lb 12.8 oz (78.835 kg)  11/25/14 177 lb 12.8 oz (80.65 kg)   Mary 2 children 25,15 daughters The older daughter manages a Chiropractor in town/ younger daughter is at [employer]!!! Ready to consider return to work [employer] more training first-?healthcare  HM utd-Pap last yr   Review of Systems 14 point review of systems per form is negative    Objective:   Physical Exam  Constitutional: She is oriented to person, place, and time. She appears well-developed and well-nourished. No distress.  HENT:  Head: Normocephalic.  Right Ear: External ear normal.  Left Ear: External ear normal.  Nose: Nose normal.  Mouth/Throat: Oropharynx is clear and moist.  Eyes: Conjunctivae and EOM are normal. Pupils are equal, round, and reactive to light.   Neck: Normal range of motion. Neck supple. No thyromegaly present.  Cardiovascular: Normal rate, regular rhythm, normal heart sounds and intact distal pulses.   No murmur heard. Pulmonary/Chest: Effort normal and breath sounds normal. She has no wheezes.  Breasts symmetrical without masses  Abdominal: Soft. Bowel sounds are normal. She exhibits no distension and no mass. There is no tenderness. There is no rebound.  Musculoskeletal: Normal range of motion. She exhibits no edema or tenderness.  Lymphadenopathy:    She has no cervical adenopathy.  Neurological: She is alert and oriented to person, place, and time. She has normal reflexes. No cranial nerve deficit.  Skin: Skin is warm and dry. No rash noted.  Psychiatric: She has a normal mood and affect. Her behavior is normal. Judgment and thought content normal.  Nursing note and vitals reviewed.  BP 126/76 mmHg  Pulse 75  Temp(Src) 98.5 F (36.9 C)  Resp 16  Ht 5' 6.5" (1.689 m)  Wt 166 lb (75.297 kg)  BMI 26.39 kg/m2  LMP 07/21/2015      Assessment & Plan:  Need for immunization against influenza - Plan: Flu Vaccine QUAD 36+ mos IM (Fluarix)  Hyperlipidemia - Plan: Lipid panel  Anemia, unspecified anemia type - Plan: CBC with Differential/Platelet  Loss of weight - Plan: TSH, T4, free//note last year the TSH was slightly low- normal free T4  Annual exam  Meds ordered this encounter  Medications  . ferrous sulfate 325 (65 FE) MG tablet    Sig: Take 325 mg by mouth daily with breakfast.   Addendum labs Results for orders placed or performed in visit on 07/30/15  CBC with Differential/Platelet  Result Value Ref Range   WBC 5.8 4.0 - 10.5 K/uL   RBC 4.90 3.87 - 5.11 MIL/uL   Hemoglobin 11.3 (L) 12.0 - 15.0 g/dL   HCT 35.2 (L) 36.0 - 46.0 %   MCV 71.8 (L) 78.0 - 100.0 fL   MCH 23.1 (L) 26.0 - 34.0 pg   MCHC 32.1 30.0 - 36.0 g/dL   RDW 16.6 (H) 11.5 - 15.5 %   Platelets 319 150 - 400 K/uL   MPV 10.8 8.6 - 12.4 fL     Neutrophils Relative % 43 43 - 77 %   Neutro Abs 2.5 1.7 - 7.7 K/uL   Lymphocytes Relative 46 12 - 46 %   Lymphs Abs 2.7 0.7 - 4.0 K/uL   Monocytes Relative 8 3 - 12 %   Monocytes Absolute 0.5 0.1 - 1.0 K/uL   Eosinophils Relative 2 0 - 5 %   Eosinophils Absolute 0.1 0.0 - 0.7 K/uL   Basophils Relative 1 0 - 1 %   Basophils Absolute 0.1 0.0 - 0.1 K/uL   Smear Review Criteria for review not met   Lipid panel  Result Value Ref Range   Cholesterol 262 (H) 125 - 200 mg/dL   Triglycerides 77 <150 mg/dL   HDL 60 >=46 mg/dL   Total CHOL/HDL Ratio 4.4 <=5.0 Ratio   VLDL 15 <30 mg/dL   LDL Cholesterol 187 (H) <130 mg/dL  TSH  Result Value Ref Range   TSH 1.444 0.350 - 4.500 uIU/mL  T4, free  Result Value Ref Range   Free T4 0.91 0.80 - 1.80 ng/dL   She will continue ferrous sulfate 325 She will be offered Lipitor 20 mg

## 2015-08-06 ENCOUNTER — Ambulatory Visit
Admission: RE | Admit: 2015-08-06 | Discharge: 2015-08-06 | Disposition: A | Discharge status: Home / Self Care | Payer: BLUE CROSS/BLUE SHIELD | Source: Ambulatory Visit

## 2015-08-06 DIAGNOSIS — Z1231 Encounter for screening mammogram for malignant neoplasm of breast: Secondary | ICD-10-CM

## 2015-08-10 ENCOUNTER — Other Ambulatory Visit: Payer: Self-pay | Admitting: Internal Medicine

## 2015-08-10 DIAGNOSIS — R928 Other abnormal and inconclusive findings on diagnostic imaging of breast: Secondary | ICD-10-CM

## 2015-08-11 ENCOUNTER — Ambulatory Visit
Admission: RE | Admit: 2015-08-11 | Discharge: 2015-08-11 | Disposition: A | Discharge status: Home / Self Care | Payer: BLUE CROSS/BLUE SHIELD | Source: Ambulatory Visit | Attending: Internal Medicine | Admitting: Internal Medicine

## 2015-08-11 DIAGNOSIS — R928 Other abnormal and inconclusive findings on diagnostic imaging of breast: Secondary | ICD-10-CM

## 2016-07-10 ENCOUNTER — Other Ambulatory Visit: Payer: Self-pay | Admitting: Family Medicine

## 2016-07-10 ENCOUNTER — Other Ambulatory Visit: Payer: Self-pay | Admitting: Internal Medicine

## 2016-07-10 DIAGNOSIS — Z1231 Encounter for screening mammogram for malignant neoplasm of breast: Secondary | ICD-10-CM

## 2016-08-14 ENCOUNTER — Ambulatory Visit
Admission: RE | Admit: 2016-08-14 | Discharge: 2016-08-14 | Disposition: A | Discharge status: Home / Self Care | Payer: BLUE CROSS/BLUE SHIELD | Source: Ambulatory Visit | Attending: Family Medicine | Admitting: Family Medicine

## 2016-08-14 DIAGNOSIS — Z1231 Encounter for screening mammogram for malignant neoplasm of breast: Secondary | ICD-10-CM

## 2016-08-16 ENCOUNTER — Other Ambulatory Visit: Payer: Self-pay | Admitting: Family Medicine

## 2016-08-16 DIAGNOSIS — R928 Other abnormal and inconclusive findings on diagnostic imaging of breast: Secondary | ICD-10-CM

## 2016-08-21 ENCOUNTER — Other Ambulatory Visit: Payer: Self-pay

## 2016-08-21 ENCOUNTER — Other Ambulatory Visit: Payer: Self-pay | Admitting: Family Medicine

## 2016-08-21 DIAGNOSIS — R928 Other abnormal and inconclusive findings on diagnostic imaging of breast: Secondary | ICD-10-CM

## 2016-08-22 ENCOUNTER — Ambulatory Visit
Admission: RE | Admit: 2016-08-22 | Discharge: 2016-08-22 | Disposition: A | Discharge status: Home / Self Care | Payer: BLUE CROSS/BLUE SHIELD | Source: Ambulatory Visit | Attending: Family Medicine | Admitting: Family Medicine

## 2016-08-22 DIAGNOSIS — R928 Other abnormal and inconclusive findings on diagnostic imaging of breast: Secondary | ICD-10-CM

## 2016-08-31 ENCOUNTER — Ambulatory Visit (INDEPENDENT_AMBULATORY_CARE_PROVIDER_SITE_OTHER): Payer: BLUE CROSS/BLUE SHIELD | Admitting: Family Medicine

## 2016-08-31 ENCOUNTER — Other Ambulatory Visit: Payer: Self-pay | Admitting: Family Medicine

## 2016-08-31 ENCOUNTER — Encounter: Payer: Self-pay | Admitting: Family Medicine

## 2016-08-31 VITALS — BP 130/80 | HR 88 | Temp 98.8°F | Resp 16 | Ht 67.0 in | Wt 142.2 lb

## 2016-08-31 DIAGNOSIS — Z23 Encounter for immunization: Secondary | ICD-10-CM | POA: Diagnosis not present

## 2016-08-31 DIAGNOSIS — Z8249 Family history of ischemic heart disease and other diseases of the circulatory system: Secondary | ICD-10-CM

## 2016-08-31 DIAGNOSIS — E785 Hyperlipidemia, unspecified: Secondary | ICD-10-CM

## 2016-08-31 DIAGNOSIS — Z1389 Encounter for screening for other disorder: Secondary | ICD-10-CM | POA: Diagnosis not present

## 2016-08-31 DIAGNOSIS — D500 Iron deficiency anemia secondary to blood loss (chronic): Secondary | ICD-10-CM | POA: Diagnosis not present

## 2016-08-31 DIAGNOSIS — R7989 Other specified abnormal findings of blood chemistry: Secondary | ICD-10-CM

## 2016-08-31 DIAGNOSIS — R946 Abnormal results of thyroid function studies: Secondary | ICD-10-CM | POA: Diagnosis not present

## 2016-08-31 DIAGNOSIS — R196 Halitosis: Secondary | ICD-10-CM | POA: Diagnosis not present

## 2016-08-31 DIAGNOSIS — Z0000 Encounter for general adult medical examination without abnormal findings: Secondary | ICD-10-CM | POA: Diagnosis not present

## 2016-08-31 DIAGNOSIS — Z136 Encounter for screening for cardiovascular disorders: Secondary | ICD-10-CM | POA: Diagnosis not present

## 2016-08-31 DIAGNOSIS — Z1383 Encounter for screening for respiratory disorder NEC: Secondary | ICD-10-CM

## 2016-08-31 LAB — CBC
HCT: 37.1 % (ref 35.0–45.0)
Hemoglobin: 11.9 g/dL (ref 11.7–15.5)
MCH: 22.6 pg — AB (ref 27.0–33.0)
MCHC: 32.1 g/dL (ref 32.0–36.0)
MCV: 70.5 fL — ABNORMAL LOW (ref 80.0–100.0)
MPV: 9.6 fL (ref 7.5–12.5)
Platelets: 341 10*3/uL (ref 140–400)
RBC: 5.26 MIL/uL — ABNORMAL HIGH (ref 3.80–5.10)
RDW: 16.6 % — ABNORMAL HIGH (ref 11.0–15.0)
WBC: 6.5 10*3/uL (ref 3.8–10.8)

## 2016-08-31 LAB — POCT URINALYSIS DIP (MANUAL ENTRY)
Bilirubin, UA: NEGATIVE
Glucose, UA: NEGATIVE
Ketones, POC UA: NEGATIVE
Leukocytes, UA: NEGATIVE
Nitrite, UA: NEGATIVE
PROTEIN UA: NEGATIVE
RBC UA: NEGATIVE
SPEC GRAV UA: 1.01
UROBILINOGEN UA: 0.2
pH, UA: 5.5

## 2016-08-31 MED ORDER — OMEPRAZOLE 40 MG PO CPDR: 40.0000 mg | DELAYED_RELEASE_CAPSULE | Freq: Every day | ORAL | 3 refills | Status: DC

## 2016-08-31 NOTE — Patient Instructions (Addendum)
Iron-Rich Diet Iron is a mineral that helps your body to produce hemoglobin. Hemoglobin is a protein in your red blood cells that carries oxygen to your body's tissues. Eating too little iron may cause you to feel weak and tired, and it can increase your risk for infection. Eating enough iron is necessary for your body's metabolism, muscle function, and nervous system. Iron is naturally found in many foods. It can also be added to foods or fortified in foods. There are two types of dietary iron:  Heme iron. Heme iron is absorbed by the body more easily than nonheme iron. Heme iron is found in meat, poultry, and fish.  Nonheme iron. Nonheme iron is found in dietary supplements, iron-fortified grains, beans, and vegetables. You may need to follow an iron-rich diet if:  You have been diagnosed with iron deficiency or iron-deficiency anemia.  You have a condition that prevents you from absorbing dietary iron, such as:  Infection in your intestines.  Celiac disease. This involves long-lasting (chronic) inflammation of your intestines.  You do not eat enough iron.  You eat a diet that is high in foods that impair iron absorption.  You have lost a lot of blood.  You have heavy bleeding during your menstrual cycle.  You are pregnant. WHAT IS MY PLAN? Your health care provider may help you to determine how much iron you need per day based on your condition. Generally, when a person consumes sufficient amounts of iron in the diet, the following iron needs are met:  Men.  35-85 years old: 11 mg per day.  43-3 years old: 8 mg per day.  Women.   55-40 years old: 15 mg per day.  61-47 years old: 18 mg per day.  Over 53 years old: 8 mg per day.  Pregnant women: 27 mg per day.  Breastfeeding women: 9 mg per day. WHAT DO I NEED TO KNOW ABOUT AN IRON-RICH DIET?  Eat fresh fruits and vegetables that are high in vitamin C along with foods that are high in iron. This will help increase  the amount of iron that your body absorbs from food, especially with foods containing nonheme iron. Foods that are high in vitamin C include oranges, peppers, tomatoes, and mango.  Take iron supplements only as directed by your health care provider. Overdose of iron can be life-threatening. If you were prescribed iron supplements, take them with orange juice or a vitamin C supplement.  Cook foods in pots and pans that are made from iron.   Eat nonheme iron-containing foods alongside foods that are high in heme iron. This helps to improve your iron absorption.   Certain foods and drinks contain compounds that impair iron absorption. Avoid eating these foods in the same meal as iron-rich foods or with iron supplements. These include:  Coffee, black tea, and red wine.  Milk, dairy products, and foods that are high in calcium.  Beans, soybeans, and peas.  Whole grains.  When eating foods that contain both nonheme iron and compounds that impair iron absorption, follow these tips to absorb iron better.   Soak beans overnight before cooking.  Soak whole grains overnight and drain them before using.  Ferment flours before baking, such as using yeast in bread dough. WHAT FOODS CAN I EAT? Grains Iron-fortified breakfast cereal. Iron-fortified whole-wheat bread. Enriched rice. Sprouted grains. Vegetables Spinach. Potatoes with skin. Green peas. Broccoli. Red and green bell peppers. Fermented vegetables. Fruits Prunes. Raisins. Oranges. Strawberries. Mango. Grapefruit. Meats and Other Protein  Sources Beef liver. Oysters. Beef. Shrimp. [place]. Chicken. [place]. Sardines. Chickpeas. Nuts. Tofu. Beverages Tomato juice. Fresh orange juice. Prune juice. Hibiscus tea. Fortified instant breakfast shakes. Condiments Tahini. Fermented soy sauce. Sweets and Desserts Black-strap molasses.  Other Wheat germ. The items listed above may not be a complete list of recommended foods or  beverages. Contact your dietitian for more options. WHAT FOODS ARE NOT RECOMMENDED? Grains Whole grains. Bran cereal. Bran flour. Oats. Vegetables Artichokes. Brussels sprouts. Kale. Fruits Blueberries. Raspberries. Strawberries. Figs. Meats and Other Protein Sources Soybeans. Products made from soy protein. Dairy Milk. Cream. Cheese. Yogurt. Cottage cheese. Beverages Coffee. Black tea. Red wine. Sweets and Desserts Cocoa. Chocolate. Ice cream. Other Basil. Oregano. Parsley. The items listed above may not be a complete list of foods and beverages to avoid. Contact your dietitian for more information.   This information is not intended to replace advice given to you by your health care provider. Make sure you discuss any questions you have with your health care provider.   Document Released: 06/13/2005 Document Revised: 11/20/2014 Document Reviewed: 05/27/2014 Elsevier Interactive Patient Education 2016 [employer].  Iron Deficiency Anemia, Adult Anemia is a condition in which there are less red blood cells or hemoglobin in the blood than normal. Hemoglobin is the part of red blood cells that carries oxygen. Iron deficiency anemia is anemia caused by too little iron. It is the most common type of anemia. It may leave you tired and short of breath. CAUSES   Lack of iron in the diet.  Poor absorption of iron, as seen with intestinal disorders.  Intestinal bleeding.  Heavy periods. SIGNS AND SYMPTOMS  Mild anemia may not be noticeable. Symptoms may include:  Fatigue.  Headache.  Pale skin.  Weakness.  Tiredness.  Shortness of breath.  Dizziness.  Cold hands and feet.  Fast or irregular heartbeat. DIAGNOSIS  Diagnosis requires a thorough evaluation and physical exam by your health care provider. Blood tests are generally used to confirm iron deficiency anemia. Additional tests may be done to find the underlying cause of your anemia. These may  include:  Testing for blood in the stool (fecal occult blood test).  A procedure to see inside the colon and rectum (colonoscopy).  A procedure to see inside the esophagus and stomach (endoscopy). TREATMENT  Iron deficiency anemia is treated by correcting the cause of the deficiency. Treatment may involve:  Adding iron-rich foods to your diet.  Taking iron supplements. Pregnant or breastfeeding women need to take extra iron because their normal diet usually does not provide the required amount.  Taking vitamins. Vitamin C improves the absorption of iron. Your health care provider may recommend that you take your iron tablets with a glass of orange juice or vitamin C supplement.  Medicines to make heavy menstrual flow lighter.  Surgery. HOME CARE INSTRUCTIONS   Take iron as directed by your health care provider.  If you cannot tolerate taking iron supplements by mouth, talk to your health care provider about taking them through a vein (intravenously) or an injection into a muscle.  For the best iron absorption, iron supplements should be taken on an empty stomach. If you cannot tolerate them on an empty stomach, you may need to take them with food.  Do not drink milk or take antacids at the same time as your iron supplements. Milk and antacids may interfere with the absorption of iron.  Iron supplements can cause constipation. Make sure to include fiber in your diet to  prevent constipation. A stool softener may also be recommended.  Take vitamins as directed by your health care provider.  Eat a diet rich in iron. Foods high in iron include liver, lean beef, whole-grain bread, eggs, dried fruit, and dark green leafy vegetables. SEEK IMMEDIATE MEDICAL CARE IF:   You faint. If this happens, do not drive. Call your local emergency services (911 in [place] if no other help is available.  You have chest pain.  You feel nauseous or vomit.  You have severe or increased shortness of  breath with activity.  You feel weak.  You have a rapid heartbeat.  You have unexplained sweating.  You become light-headed when getting up from a chair or bed. MAKE SURE YOU:   Understand these instructions.  Will watch your condition.  Will get help right away if you are not doing well or get worse.   This information is not intended to replace advice given to you by your health care provider. Make sure you discuss any questions you have with your health care provider.   Document Released: 10/27/2000 Document Revised: 11/20/2014 Document Reviewed: 07/07/2013 Elsevier Interactive Patient Education 2016 [employer].   IF you received an x-ray today, you will receive an invoice from [facility]. Please contact [facility] at [phone] with questions or concerns regarding your invoice.   IF you received labwork today, you will receive an invoice from [employer]. Please contact [facility] at [phone] with questions or concerns regarding your invoice.   Our billing staff will not be able to assist you with questions regarding bills from these companies.  You will be contacted with the lab results as soon as they are available. The fastest way to get your results is to activate your My Chart account. Instructions are located on the last page of this paperwork. If you have not heard from [place] regarding the results in 2 weeks, please contact this office.    Influenza (Flu) Vaccine (Inactivated or Recombinant):  1. Why get vaccinated? Influenza ("flu") is a contagious disease that spreads around the [place] every year, usually between October and May. Flu is caused by influenza viruses, and is spread mainly by coughing, sneezing, and close contact. Anyone can get flu. Flu strikes suddenly and can last several days. Symptoms vary by age, but can include:  fever/chills  sore throat  muscle  aches  fatigue  cough  headache  runny or stuffy nose Flu can also lead to pneumonia and blood infections, and cause diarrhea and seizures in children. If you have a medical condition, such as heart or lung disease, flu can make it worse. Flu is more dangerous for some people. Infants and young children, people 21 years of age and older, pregnant women, and people with certain health conditions or a weakened immune system are at greatest risk. Each year thousands of people in the [place] die from flu, and many more are hospitalized. Flu vaccine can:  keep you from getting flu,  make flu less severe if you do get it, and  keep you from spreading flu to your family and other people. 2. Inactivated and recombinant flu vaccines A dose of flu vaccine is recommended every flu season. Children 6 months through 26 years of age may need two doses during the same flu season. Everyone else needs only one dose each flu season. Some inactivated flu vaccines contain a very small amount of a mercury-based preservative called thimerosal. Studies have not shown thimerosal  in vaccines to be harmful, but flu vaccines that do not contain thimerosal are available. There is no live flu virus in flu shots. They cannot cause the flu. There are many flu viruses, and they are always changing. Each year a new flu vaccine is made to protect against three or four viruses that are likely to cause disease in the upcoming flu season. But even when the vaccine doesn't exactly match these viruses, it may still provide some protection. Flu vaccine cannot prevent:  flu that is caused by a virus not covered by the vaccine, or  illnesses that look like flu but are not. It takes about 2 weeks for protection to develop after vaccination, and protection lasts through the flu season. 3. Some people should not get this vaccine Tell the person who is giving you the vaccine:  If you have any severe, life-threatening  allergies. If you ever had a life-threatening allergic reaction after a dose of flu vaccine, or have a severe allergy to any part of this vaccine, you may be advised not to get vaccinated. Most, but not all, types of flu vaccine contain a small amount of egg protein.  If you ever had Guillain-Barre Syndrome (also called GBS). Some people with a history of GBS should not get this vaccine. This should be discussed with your doctor.  If you are not feeling well. It is usually okay to get flu vaccine when you have a mild illness, but you might be asked to come back when you feel better. 4. Risks of a vaccine reaction With any medicine, including vaccines, there is a chance of reactions. These are usually mild and go away on their own, but serious reactions are also possible. Most people who get a flu shot do not have any problems with it. Minor problems following a flu shot include:  soreness, redness, or swelling where the shot was given  hoarseness  sore, red or itchy eyes  cough  fever  aches  headache  itching  fatigue If these problems occur, they usually begin soon after the shot and last 1 or 2 days. More serious problems following a flu shot can include the following:  There may be a small increased risk of Guillain-Barre Syndrome (GBS) after inactivated flu vaccine. This risk has been estimated at 1 or 2 additional cases per million people vaccinated. This is much lower than the risk of severe complications from flu, which can be prevented by flu vaccine.  Young children who get the flu shot along with pneumococcal vaccine (PCV13) and/or DTaP vaccine at the same time might be slightly more likely to have a seizure caused by fever. Ask your doctor for more information. Tell your doctor if a child who is getting flu vaccine has ever had a seizure. Problems that could happen after any injected vaccine:  People sometimes faint after a medical procedure, including vaccination.  Sitting or lying down for about 15 minutes can help prevent fainting, and injuries caused by a fall. Tell your doctor if you feel dizzy, or have vision changes or ringing in the ears.  Some people get severe pain in the shoulder and have difficulty moving the arm where a shot was given. This happens very rarely.  Any medication can cause a severe allergic reaction. Such reactions from a vaccine are very rare, estimated at about 1 in a million doses, and would happen within a few minutes to a few hours after the vaccination. As with any medicine, there is  a very remote chance of a vaccine causing a serious injury or death. The safety of vaccines is always being monitored. For more information, visit: http://www.aguilar.org/ 5. What if there is a serious reaction? What should I look for?  Look for anything that concerns you, such as signs of a severe allergic reaction, very high fever, or unusual behavior. Signs of a severe allergic reaction can include hives, swelling of the face and throat, difficulty breathing, a fast heartbeat, dizziness, and weakness. These would start a few minutes to a few hours after the vaccination. What should I do?  If you think it is a severe allergic reaction or other emergency that can't wait, call 9-1-1 and get the person to the nearest hospital. Otherwise, call your doctor.  Reactions should be reported to the Vaccine Adverse Event Reporting System (VAERS). Your doctor should file this report, or you can do it yourself through the VAERS web site at www.vaers.SamedayNews.es, or by calling 202 774 5746. VAERS does not give medical advice. 6. The National Vaccine Injury Compensation Program The [employer] Vaccine Injury Compensation Program (VICP) is a federal program that was created to compensate people who may have been injured by certain vaccines. Persons who believe they may have been injured by a vaccine can learn about the program and about filing a claim by calling  814-079-3797 or visiting the [place] website at GoldCloset.com.ee. There is a time limit to file a claim for compensation. 7. How can I learn more?  Ask your healthcare provider. He or she can give you the vaccine package insert or suggest other sources of information.  Call your local or state health department.  Contact the Centers for Disease Control and Prevention (CDC):  Call 531-847-5566 (1-800-CDC-INFO) or  Visit CDC's website at https://gibson.com/ Vaccine Information Statement Inactivated Influenza Vaccine (06/19/2014)   This information is not intended to replace advice given to you by your health care provider. Make sure you discuss any questions you have with your health care provider.   Document Released: 08/24/2006 Document Revised: 11/20/2014 Document Reviewed: 06/22/2014 Elsevier Interactive Patient Education [employer].

## 2016-08-31 NOTE — Progress Notes (Addendum)
Subjective:    Patient ID: Jessica [name], female    DOB: Jun 15, 1971, 45 y.o.   MRN: [id number] Chief Complaint  Patient presents with  . Annual Exam    depression screen during triage, score 1    HPI  Jessica [name] is a 45 yo woman here today for her full fphysical.  This is my first time meeting this patient who was previously cared for by my now retired Social worker Dr. Laney [name].  Primary Preventative Screenings: Breast cancer: Jad mammogram 08/14/16 with asymmetry in right breast which had further imagine gand [place] and appears to be normal fibrocystic chyanges Cervical cancer: Pap 06/2014 neg with neg HR HPV so repeat 2020. STI: neg HIV Immunizations: TDaP 2015, flu annually Diet/Exercise: Has increased her exercises - walking, going to the gym Dental/Optho:went to eye doctor last yr and hasn't changed much.  She goes to the dentist who says her bad breath isn't an oral problems.  Vitamins/Supp/OTC: no other supp than rion  Chronic Medical Conditions: Iron Def Anemia: On iron supp to twice a day ferrace sulfacte 325 (65) mg with food, no bowel problems.  ON an empty stomach gave her bad cramps  Periods every month.  HPL: LDL 187 last yr with non-hdl 202 - did not start  Will get heartburn if she overeats only.  She has a lot of thick mucous n the back of her throat in the morning and oes nasal saline watches. No void change, no hoardsness, no thorat irritaiton. Does get seasonal allergies but no bothering her much.  Not clearing throat a lot.   Mother 90 yo massive MI, father with drug abuse.  MGM also had massive MI and passed in late 10s.    She does have a h/o of significant psychological difficulties but has been able to discontinue all medication. As has side effect. Has seen Dr. Casimiro [name] for depression prior.  2 children - 26, 16 daughters.  The older daughter manages a restaurant in town/ younger daughter is at [place]   Past Medical History:  Diagnosis Date  . Allergy   .  Anemia   . Anxiety   . Depression    Past Surgical History:  Procedure Laterality Date  . TUBAL LIGATION     Current Outpatient Prescriptions on File Prior to Visit  Medication Sig Dispense Refill  . ferrous sulfate 325 (65 FE) MG tablet Take 325 mg by mouth daily with breakfast.     No current facility-administered medications on file prior to visit.    Allergies  Allergen Reactions  . Sulfonamide Derivatives     REACTION: hives   Family History  Problem Relation Age of Onset  . Alcohol abuse Father   . Drug abuse Father   . Hyperlipidemia Father   . Hypertension Father   . Alcohol abuse Maternal Uncle   . Heart disease Mother   . Hyperlipidemia Mother   . Hypertension Mother   . Heart disease Maternal Grandmother   . Hypertension Maternal Grandmother   . Hyperlipidemia Maternal Grandmother   . Cancer Paternal Grandmother   . Diabetes Paternal Grandmother   . Hypertension Paternal Grandmother   . Cancer Paternal Grandfather    Social History   Social History  . Marital status: Married    Spouse name: N/A  . Number of children: N/A  . Years of education: N/A   Social History Main Topics  . Smoking status: Never Smoker  . Smokeless tobacco: Never Used  . Alcohol use No  .  Drug use: No  . Sexual activity: Yes   Other Topics Concern  . None   Social History Narrative  . None    Review of Systems  HENT: Positive for dental problem (dry mouth and bad breath) and postnasal drip.   Allergic/Immunologic: Positive for environmental allergies.  Psychiatric/Behavioral: Positive for dysphoric mood.  All other systems reviewed and are negative.      Objective:   Physical Exam  Constitutional: She is oriented to person, place, and time. She appears well-developed and well-nourished. No distress.  HENT:  Head: Normocephalic and atraumatic.  Right Ear: Tympanic membrane, external ear and ear canal normal.  Left Ear: Tympanic membrane, external ear and ear  canal normal.  Nose: Nose normal. No mucosal edema or rhinorrhea.  Mouth/Throat: Uvula is midline, oropharynx is clear and moist and mucous membranes are normal. No posterior oropharyngeal erythema.  Eyes: Conjunctivae and EOM are normal. Pupils are equal, round, and reactive to light. Right eye exhibits no discharge. Left eye exhibits no discharge. No scleral icterus.  Neck: Normal range of motion. Neck supple. No thyromegaly present.  Cardiovascular: Normal rate, regular rhythm, normal heart sounds and intact distal pulses.   Pulmonary/Chest: Effort normal and breath sounds normal. No respiratory distress.  Abdominal: Soft. Bowel sounds are normal. There is no tenderness.  Genitourinary: No breast swelling, tenderness, discharge or bleeding.  Musculoskeletal: She exhibits no edema.  Lymphadenopathy:    She has no cervical adenopathy.  Neurological: She is alert and oriented to person, place, and time. She has normal reflexes.  Skin: Skin is warm and dry. She is not diaphoretic. No erythema.  Psychiatric: She has a normal mood and affect. Her behavior is normal.      BP 130/80   Pulse 88   Temp 98.8 F (37.1 C) (Oral)   Resp 16   Ht 5\' 7"  (1.702 m)   Wt 142 lb 3.2 oz (64.5 kg)   LMP 08/21/2016   SpO2 98%   BMI 22.27 kg/m      Assessment & Plan:    1. Annual physical exam   2. Needs flu shot   3. Screening for cardiovascular, respiratory, and genitourinary diseases   4. Iron deficiency anemia due to chronic blood loss - consider IUD for anemia persists despite bid iron supps  5. Halitosis - dentist states not from teeth.  Try trx for silent LRD with ppi; if this doesn't help, try nasal steroid instead.  6. Hyperlipidemia, unspecified hyperlipidemia type - consider referral to lipid clinic for full ldl breakdown test due to + FHx of lipids have not improved.  7. Family history of cardiac disorder in mother   59. Abnormal thyroid blood test     Orders Placed This Encounter    Procedures  . Flu Vaccine QUAD 36+ mos IM  . CBC  . Comprehensive metabolic panel    Order Specific Question:   Has the patient fasted?    Answer:   Yes  . Lipid panel    Order Specific Question:   Has the patient fasted?    Answer:   Yes  . Ferritin  . Thyroid Panel With TSH  . VITAMIN D 25 Hydroxy (Vit-D Deficiency, Fractures)  . Care order/instruction:    AVS and GO    Scheduling Instructions:     AVS and GO  . POCT urinalysis dipstick    Meds ordered this encounter  Medications  . omeprazole (PRILOSEC) 40 MG capsule    Sig: Take 1  capsule (40 mg total) by mouth daily. 30 minutes prior to dinner    Dispense:  30 capsule    Refill:  3     Delman Cheadle, M.D.  [facility] [address] [place], [place] [address] [phone] phone [phone] fax  08/31/16 11:17 PM

## 2016-09-01 LAB — COMPREHENSIVE METABOLIC PANEL

## 2016-09-01 LAB — THYROID PANEL WITH TSH

## 2016-09-01 LAB — LIPID PANEL

## 2016-09-01 LAB — VITAMIN D 25 HYDROXY (VIT D DEFICIENCY, FRACTURES)

## 2016-09-01 LAB — FERRITIN

## 2016-09-02 LAB — COMPREHENSIVE METABOLIC PANEL
ALBUMIN: 4.6 g/dL (ref 3.6–5.1)
ALT: 8 U/L (ref 6–29)
AST: 16 U/L (ref 10–35)
Alkaline Phosphatase: 52 U/L (ref 33–115)
BILIRUBIN TOTAL: 0.5 mg/dL (ref 0.2–1.2)
BUN: 8 mg/dL (ref 7–25)
CO2: 25 mmol/L (ref 20–31)
CREATININE: 0.92 mg/dL (ref 0.50–1.10)
Calcium: 9.6 mg/dL (ref 8.6–10.2)
Chloride: 102 mmol/L (ref 98–110)
Glucose, Bld: 82 mg/dL (ref 65–99)
Potassium: 4 mmol/L (ref 3.5–5.3)
Sodium: 139 mmol/L (ref 135–146)
TOTAL PROTEIN: 8.3 g/dL — AB (ref 6.1–8.1)

## 2016-09-02 LAB — LIPID PANEL
Cholesterol: 320 mg/dL — ABNORMAL HIGH (ref 125–200)
HDL: 82 mg/dL (ref >=46)
LDL CALC: 225 mg/dL — AB (ref <130)
Total CHOL/HDL Ratio: 3.9 Ratio (ref <=5.0)
Triglycerides: 63 mg/dL (ref <150)
VLDL: 13 mg/dL (ref <30)

## 2016-09-02 LAB — THYROID PANEL WITH TSH
FREE THYROXINE INDEX: 2.5 (ref 1.4–3.8)
T3 Uptake: 28 % (ref 22–35)
T4, Total: 9 ug/dL (ref 4.5–12.0)
TSH: 1.87 m[IU]/L

## 2016-09-02 LAB — VITAMIN D 25 HYDROXY (VIT D DEFICIENCY, FRACTURES): VIT D 25 HYDROXY: 10 ng/mL — AB (ref 30–100)

## 2016-09-02 LAB — FERRITIN: FERRITIN: 149 ng/mL (ref 10–232)

## 2016-09-04 ENCOUNTER — Telehealth: Payer: Self-pay

## 2016-09-04 LAB — CBC

## 2016-09-04 MED ORDER — ERGOCALCIFEROL 1.25 MG (50000 UT) PO CAPS
50000.0000 [IU] | ORAL_CAPSULE | ORAL | 1 refills | Status: DC
Start: 2016-09-04 — End: 2017-02-28

## 2016-09-04 NOTE — Telephone Encounter (Signed)
[facility] called stating the lavender top was not sent to perform the CBC. Do you want me to call pt for redraw. Send answer to lab add on pool. Thanks

## 2016-09-04 NOTE — Telephone Encounter (Signed)
Nope, looks like it was run on a duplicate order. Very weird but one of everything got cancelled so no duplicates were actually reported.

## 2016-09-04 NOTE — Addendum Note (Signed)
Addended by: Delman Cheadle on: 09/04/2016 06:04 PM   Modules accepted: Orders

## 2016-10-03 ENCOUNTER — Other Ambulatory Visit: Payer: Self-pay | Admitting: Emergency Medicine

## 2016-10-03 MED ORDER — OMEPRAZOLE 40 MG PO CPDR: 40.0000 mg | DELAYED_RELEASE_CAPSULE | Freq: Every day | ORAL | 0 refills | Status: DC

## 2016-10-19 ENCOUNTER — Other Ambulatory Visit: Payer: Self-pay

## 2016-10-19 NOTE — Telephone Encounter (Signed)
fx req to chg omeprazole to 90 day supply - [facility]  [place]  Already completed 10/03/2016

## 2017-02-10 ENCOUNTER — Other Ambulatory Visit: Payer: Self-pay | Admitting: Family Medicine

## 2017-02-28 ENCOUNTER — Other Ambulatory Visit: Payer: Self-pay | Admitting: Family Medicine

## 2017-04-01 ENCOUNTER — Encounter: Payer: Self-pay | Admitting: Family Medicine

## 2017-04-01 ENCOUNTER — Other Ambulatory Visit: Payer: Self-pay | Admitting: Family Medicine

## 2017-07-10 ENCOUNTER — Other Ambulatory Visit: Payer: Self-pay | Admitting: Family Medicine

## 2017-07-10 DIAGNOSIS — Z1231 Encounter for screening mammogram for malignant neoplasm of breast: Secondary | ICD-10-CM

## 2017-07-25 ENCOUNTER — Telehealth: Payer: Self-pay

## 2017-08-27 ENCOUNTER — Ambulatory Visit: Payer: Self-pay

## 2017-09-03 ENCOUNTER — Encounter: Payer: BLUE CROSS/BLUE SHIELD | Admitting: Family Medicine

## 2017-09-04 NOTE — Telephone Encounter (Signed)
error 

## 2017-09-09 NOTE — Progress Notes (Signed)
Subjective:    Patient ID: Jessica [name], female    DOB: 05/25/1971, 46 y.o.   MRN: [id number] Chief Complaint  Patient presents with  . Annual Exam    HPI  Jessica [name] is a 46 yo woman here today for her annual physical.    Primary Preventative Screenings: Cervical Cancer: Pap 06/2014 neg with neg HR HPV so repeat 2020. Family Planning: s/p BTL STI screening: neg HIV 2016, declines Breast Cancer: mammogram 08/14/16 with Rt breast asymmetry which on f/u US appears normal fibrocystic changes.  Colorectal Cancer: NA due to age Tobacco use/EtOH/substances: Bone Density: taking on vit C. Did do vitamin D for a hile but stopped.  Cardiac: none prior but severe HLD and + FHx Weight/Blood sugar/Diet/Exercise: exercises - walking, going to the gym  BMI Readings from Last 3 Encounters:  08/31/16 22.27 kg/m  07/30/15 26.39 kg/m  04/07/15 27.43 kg/m   No results found for: HGBA1C OTC/Vit/Supp/Herbal: iron supp Dentist/Optho: sees both occ, no prob Immunizations:  Immunization History  Administered Date(s) Administered  . Influenza Split 07/28/2012  . Influenza,inj,Quad PF,6+ Mos 08/09/2013, 08/26/2014, 07/30/2015, 08/31/2016  . Td 04/13/2002  . Tdap 06/25/2014     Chronic Medical Conditions: Iron Def Anemia: On iron supp once a day ferrous sulfate 325 (65) mg with food (on empty stomach gave her bad cramps) no bowel problems.    CBC Latest Ref Rng & Units 08/31/2016 08/31/2016 07/30/2015  WBC 3.8 - 10.8 K/uL CANCELED 6.5 5.8  Hemoglobin 11.7 - 15.5 g/dL CANCELED 11.9 11.3(L)  Hematocrit 35.0 - 45.0 % CANCELED 37.1 35.2(L)  Platelets 140 - 400 K/uL CANCELED 341 319   HLD: Lipids have been worsening: LDL 187 ->225 last yr with non-hdl 202 -> 238 - discussed referral to lipid clinic for detailed lipid breakdown due to + FHx.  She thinks she did fine on lipitor prior but was taken off after came down.   Vit D def: 10 last yr - replaced on high dose x 6 mos, then otc.  GERD: Started  on omeprazole last yr as severe bad breath that dentist stated was not due to teeth. Will get heartburn if she overeats only.  She has a lot of thick mucous in the back of her throat in the morning and does nasal saline watches. No voice change, no hoarseness, no throat irritation.  Does get seasonal allergies but not bothering her much.    Mother 28 yo massive MI, father with drug abuse.  MGM also had massive MI and passed in late 44s.    She does have a h/o of significant psychological difficulties but has been able to discontinue all medication. As has side effect. Has seen Dr. Casimiro Needle for depression prior.  2 children - 26, 16 daughters.  The older daughter manages a restaurant in town/ younger daughter is at [employer]   + white tongue but can scrape it off.  H. Pylori H/o bad constipation - once every 3d in her 60s - she has been taking [place] which is an herbal laxative. From [place] and also taking digestive enzyme but has not noticed any benefit.   > 20 years.   Past Medical History:  Diagnosis Date  . Allergy   . Anemia   . Anxiety   . Depression    Past Surgical History:  Procedure Laterality Date  . TUBAL LIGATION     Current Outpatient Prescriptions on File Prior to Visit  Medication Sig Dispense Refill  . ferrous sulfate 325 (  65 FE) MG tablet Take 325 mg by mouth daily with breakfast.    . omeprazole (PRILOSEC) 40 MG capsule TAKE 1 CAPSULE (40 MG TOTAL) BY MOUTH DAILY. 30 MINUTES PRIOR TO DINNER 90 capsule 0  . Vitamin D, Ergocalciferol, (DRISDOL) 50000 units CAPS capsule TAKE 1 CAPSULE (50,000 UNITS TOTAL) BY MOUTH ONCE A WEEK. 4 capsule 0   No current facility-administered medications on file prior to visit.    Allergies  Allergen Reactions  . Sulfonamide Derivatives     REACTION: hives   Family History  Problem Relation Age of Onset  . Alcohol abuse Father   . Drug abuse Father   . Hyperlipidemia Father   . Hypertension Father   . Alcohol abuse  Maternal Uncle   . Heart disease Mother   . Hyperlipidemia Mother   . Hypertension Mother   . Heart disease Maternal Grandmother   . Hypertension Maternal Grandmother   . Hyperlipidemia Maternal Grandmother   . Cancer Paternal Grandmother   . Diabetes Paternal Grandmother   . Hypertension Paternal Grandmother   . Cancer Paternal Grandfather    Social History   Social History  . Marital status: Married    Spouse name: N/A  . Number of children: N/A  . Years of education: N/A   Social History Main Topics  . Smoking status: Never Smoker  . Smokeless tobacco: Never Used  . Alcohol use No  . Drug use: No  . Sexual activity: Yes   Other Topics Concern  . Not on file   Social History Narrative  . No narrative on file    Review of Systems  HENT: Positive for dental problem (dry mouth and bad breath) and postnasal drip.   Allergic/Immunologic: Positive for environmental allergies.  Psychiatric/Behavioral: Positive for dysphoric mood.  All other systems reviewed and are negative.      Objective:   Physical Exam  Constitutional: She is oriented to person, place, and time. She appears well-developed and well-nourished. No distress.  HENT:  Head: Normocephalic and atraumatic.  Right Ear: Tympanic membrane, external ear and ear canal normal.  Left Ear: Tympanic membrane, external ear and ear canal normal.  Nose: Nose normal. No mucosal edema or rhinorrhea.  Mouth/Throat: Uvula is midline, oropharynx is clear and moist and mucous membranes are normal. No posterior oropharyngeal erythema.  Eyes: Pupils are equal, round, and reactive to light. Conjunctivae and EOM are normal. Right eye exhibits no discharge. Left eye exhibits no discharge. No scleral icterus.  Neck: Normal range of motion. Neck supple. No thyromegaly present.  Cardiovascular: Normal rate, regular rhythm, normal heart sounds and intact distal pulses.   Pulmonary/Chest: Effort normal and breath sounds normal. No  respiratory distress.  Abdominal: Soft. Bowel sounds are normal. There is no tenderness.  Genitourinary: No breast swelling, tenderness, discharge or bleeding.  Musculoskeletal: She exhibits no edema.  Lymphadenopathy:    She has no cervical adenopathy.  Neurological: She is alert and oriented to person, place, and time. She has normal reflexes.  Skin: Skin is warm and dry. She is not diaphoretic. No erythema.  Psychiatric: She has a normal mood and affect. Her behavior is normal.      BP 138/82   Pulse 89   Temp 98.8 F (37.1 C)   Resp 16   Ht 5\' 7"  (1.702 m)   Wt 138 lb 6.4 oz (62.8 kg)   LMP 09/09/2017   SpO2 100%   BMI 21.68 kg/m      Results for  orders placed or performed in visit on 09/10/17  POCT urinalysis dipstick  Result Value Ref Range   Color, UA red (A) yellow   Clarity, UA cloudy (A) clear   Glucose, UA negative negative mg/dL   Bilirubin, UA large (A) negative   Ketones, POC UA small (15) (A) negative mg/dL   Spec Grav, UA 1.010 1.010 - 1.025   Blood, UA large (A) negative   pH, UA 6.0 5.0 - 8.0   Protein Ur, POC >=300 (A) negative mg/dL   Urobilinogen, UA 1.0 0.2 or 1.0 E.U./dL   Nitrite, UA Positive (A) Negative   Leukocytes, UA Large (3+) (A) Negative   EKG:    Assessment & Plan:  consider referral to lipid clinic for full ldl breakdown test due to + FHx of lipids have not improved Flu shot Ua, cbc, lipid, cmp, tsh, vit D, ferritin Mammogram resched  Add on a A1c. Menses 10/4, then started again very heavy for the past 2d, has been fluctuating for the past 6 mos. No sig cramp/pain. No recent hot flashes or breast tenderness. But does notice is more hot natured than others.  Does get moody but no change from baseilne Husband is diabetic    1. Annual physical exam   2. Iron deficiency anemia due to chronic blood loss   3. HYPERCHOLESTEROLEMIA   4. Encounter for screening mammogram for breast cancer   5. Family history of cardiac disorder in  mother   53. Screening for cardiovascular, respiratory, and genitourinary diseases   7. Needs flu shot   8. Vitamin D deficiency   9. Screening for thyroid disorder   10. Halitosis   11. Gastroesophageal reflux disease, esophagitis presence not specified   12. Menstrual changes   13. Chronic constipation     Orders Placed This Encounter  Procedures  . Flu Vaccine QUAD 6+ mos PF IM (Fluarix Quad PF)  . H. pylori breath test  . High sensitivity CRP  . FSH/LH  . Comprehensive metabolic panel  . Lipid panel  . TSH  . VITAMIN D 25 Hydroxy (Vit-D Deficiency, Fractures)  . CBC with Differential/Platelet  . Ferritin  . CBC with Differential/Platelet  . Comprehensive metabolic panel  . Lipid panel  . TSH  . VITAMIN D 25 Hydroxy (Vit-D Deficiency, Fractures)  . Ferritin  . POCT urinalysis dipstick  . POCT Skin KOH  . EKG 12-Lead    Meds ordered this encounter  Medications  . linaclotide (LINZESS) 145 MCG CAPS capsule    Sig: Take 1 capsule (145 mcg total) by mouth daily before breakfast.    Dispense:  30 capsule    Refill:  3  . pravastatin (PRAVACHOL) 40 MG tablet    Sig: Take 1 tablet (40 mg total) by mouth daily.    Dispense:  90 tablet    Refill:  3    I personally performed the services described in this documentation, which was scribed in my presence. The recorded information has been reviewed and considered, and addended by me as needed.   Delman Cheadle, M.D.  [facility] at [facility] [address] [place], [place] [address] [phone] phone [phone] fax  09/12/17 10:24 PM

## 2017-09-10 ENCOUNTER — Ambulatory Visit (INDEPENDENT_AMBULATORY_CARE_PROVIDER_SITE_OTHER): Payer: BLUE CROSS/BLUE SHIELD | Admitting: Family Medicine

## 2017-09-10 ENCOUNTER — Encounter: Payer: Self-pay | Admitting: Family Medicine

## 2017-09-10 VITALS — BP 138/82 | HR 89 | Temp 98.8°F | Resp 16 | Ht 67.0 in | Wt 138.4 lb

## 2017-09-10 DIAGNOSIS — Z1329 Encounter for screening for other suspected endocrine disorder: Secondary | ICD-10-CM | POA: Diagnosis not present

## 2017-09-10 DIAGNOSIS — E7800 Pure hypercholesterolemia, unspecified: Secondary | ICD-10-CM

## 2017-09-10 DIAGNOSIS — Z1389 Encounter for screening for other disorder: Secondary | ICD-10-CM | POA: Diagnosis not present

## 2017-09-10 DIAGNOSIS — Z1383 Encounter for screening for respiratory disorder NEC: Secondary | ICD-10-CM | POA: Diagnosis not present

## 2017-09-10 DIAGNOSIS — Z23 Encounter for immunization: Secondary | ICD-10-CM | POA: Diagnosis not present

## 2017-09-10 DIAGNOSIS — K5909 Other constipation: Secondary | ICD-10-CM | POA: Diagnosis not present

## 2017-09-10 DIAGNOSIS — R196 Halitosis: Secondary | ICD-10-CM | POA: Diagnosis not present

## 2017-09-10 DIAGNOSIS — N926 Irregular menstruation, unspecified: Secondary | ICD-10-CM | POA: Diagnosis not present

## 2017-09-10 DIAGNOSIS — D500 Iron deficiency anemia secondary to blood loss (chronic): Secondary | ICD-10-CM

## 2017-09-10 DIAGNOSIS — K219 Gastro-esophageal reflux disease without esophagitis: Secondary | ICD-10-CM

## 2017-09-10 DIAGNOSIS — Z0000 Encounter for general adult medical examination without abnormal findings: Secondary | ICD-10-CM | POA: Diagnosis not present

## 2017-09-10 DIAGNOSIS — E559 Vitamin D deficiency, unspecified: Secondary | ICD-10-CM | POA: Diagnosis not present

## 2017-09-10 DIAGNOSIS — Z8249 Family history of ischemic heart disease and other diseases of the circulatory system: Secondary | ICD-10-CM | POA: Diagnosis not present

## 2017-09-10 DIAGNOSIS — Z136 Encounter for screening for cardiovascular disorders: Secondary | ICD-10-CM

## 2017-09-10 DIAGNOSIS — Z1231 Encounter for screening mammogram for malignant neoplasm of breast: Secondary | ICD-10-CM | POA: Diagnosis not present

## 2017-09-10 LAB — POCT URINALYSIS DIP (MANUAL ENTRY)
GLUCOSE UA: NEGATIVE mg/dL
NITRITE UA: POSITIVE — AB
PH UA: 6 (ref 5.0–8.0)
Protein Ur, POC: >=300 mg/dL — AB
Spec Grav, UA: 1.01 (ref 1.010–1.025)
Urobilinogen, UA: 1 E.U./dL

## 2017-09-10 LAB — POCT SKIN KOH: Skin KOH, POC: NEGATIVE

## 2017-09-10 MED ORDER — PRAVASTATIN SODIUM 40 MG PO TABS: 40.0000 mg | ORAL_TABLET | Freq: Every day | ORAL | 3 refills | Status: DC

## 2017-09-10 MED ORDER — LINACLOTIDE 145 MCG PO CAPS: 145.0000 ug | ORAL_CAPSULE | Freq: Every day | ORAL | 3 refills | Status: DC

## 2017-09-10 NOTE — Patient Instructions (Addendum)
Try to go off any herbal supplement such as the one you have been taking for your bowels. Start linzess for chronic constipation.     IF you received an x-ray today, you will receive an invoice from [facility]. Please contact [facility] at [phone] with questions or concerns regarding your invoice.   IF you received labwork today, you will receive an invoice from [place]. Please contact [facility] at [phone] with questions or concerns regarding your invoice.   Our billing staff will not be able to assist you with questions regarding bills from these companies.  You will be contacted with the lab results as soon as they are available. The fastest way to get your results is to activate your My Chart account. Instructions are located on the last page of this paperwork. If you have not heard from [place] regarding the results in 2 weeks, please contact this office.     Halitosis Halitosis is bad breath. Halitosis may be caused by:  Foods and drinks that you ingest.  Poor oral hygiene.  Medical conditions, such as sinus infections, mouth infections, and diabetes.  Medicines that dry out your mouth.  Smoking.  Follow these instructions at home:  Practice good oral hygiene. Do this by: ? Flossing every day. Ask your dentist to show you the best way to floss. ? Brushing your teeth at least two times each day using toothpaste that is recommended by your dentist. Ask your dentist to show you the best way to brush your teeth. ? Brushing your tongue when you brush your teeth. This may help to improve your breath. ? Rinsing your mouth one time each day using mouthwash that is recommended by your dentist. ? Scheduling and attending regular dental appointments.  Drink enough water to keep your urine clear or pale yellow.  Eat foods that help to keep your teeth clean, such as carrots and celery.  Avoid foods and drinks that can lead to bad breath, such  as: ? Garlic. ? Onions. ? Fish. ? Coffee. ? Alcohol. ? Horseradish. ? Red meat.  Do not use any tobacco products, including cigarettes, chewing tobacco, or electronic cigarettes. If you need help quitting, ask your health care provider.  Make sure that any mouth devices that you wear, such as a retainer or dentures, are worn and cleaned properly.  If you have a dry mouth, try chewing gum or mints that do not contain sugar. Contact a health care provider if:  You have new symptoms.  Your symptoms get worse or they do not improve with home care. This information is not intended to replace advice given to you by your health care provider. Make sure you discuss any questions you have with your health care provider. Document Released: 12/07/2004 Document Revised: 04/06/2016 Document Reviewed: 10/26/2014 Elsevier Interactive Patient Education  2018 [employer].   Constipation, Adult Constipation is when a person has fewer bowel movements in a week than normal, has difficulty having a bowel movement, or has stools that are dry, hard, or larger than normal. Constipation may be caused by an underlying condition. It may become worse with age if a person takes certain medicines and does not take in enough fluids. Follow these instructions at home: Eating and drinking   Eat foods that have a lot of fiber, such as fresh fruits and vegetables, whole grains, and beans.  Limit foods that are high in fat, low in fiber, or overly processed, such as french fries, hamburgers, cookies, candies, and soda.  Drink enough fluid to keep your urine clear or pale yellow. General instructions  Exercise regularly or as told by your health care provider.  Go to the restroom when you have the urge to go. Do not hold it in.  Take over-the-counter and prescription medicines only as told by your health care provider. These include any fiber supplements.  Practice pelvic floor retraining exercises, such as  deep breathing while relaxing the lower abdomen and pelvic floor relaxation during bowel movements.  Watch your condition for any changes.  Keep all follow-up visits as told by your health care provider. This is important. Contact a health care provider if:  You have pain that gets worse.  You have a fever.  You do not have a bowel movement after 4 days.  You vomit.  You are not hungry.  You lose weight.  You are bleeding from the anus.  You have thin, pencil-like stools. Get help right away if:  You have a fever and your symptoms suddenly get worse.  You leak stool or have blood in your stool.  Your abdomen is bloated.  You have severe pain in your abdomen.  You feel dizzy or you faint. This information is not intended to replace advice given to you by your health care provider. Make sure you discuss any questions you have with your health care provider. Document Released: 07/28/2004 Document Revised: 05/19/2016 Document Reviewed: 04/19/2016 Elsevier Interactive Patient Education  2017 [employer].  About Constipation  Constipation Overview Constipation is the most common gastrointestinal complaint - about 4 million Americans experience constipation and make 2.5 million physician visits a year to get help for the problem.  Constipation can occur when the colon absorbs too much water, the colon's muscle contraction is slow or sluggish, and/or there is delayed transit time through the colon.  The result is stool that is hard and dry.  Indicators of constipation include straining during bowel movements greater than 25% of the time, having fewer than three bowel movements per week, and/or the feeling of incomplete evacuation.  There are established guidelines ([place] II ) for defining constipation. A person needs to have two or more of the following symptoms for at least 12 weeks (not necessarily consecutive) in the preceding 12 months: . Straining in  greater than 25% of  bowel movements . Lumpy or hard stools in greater than 25% of bowel movements . Sensation of incomplete emptying in greater than 25% of bowel movements . Sensation of anorectal obstruction/blockade in greater than 25% of bowel movements . Manual maneuvers to help empty greater than 25% of bowel movements (e.g., digital evacuation, support of the pelvic floor)  . Less than  3 bowel movements/week . Loose stools are not present, and criteria for irritable bowel syndrome are insufficient  Common Causes of Constipation . Lack of fiber in your diet . Lack of physical activity . Medications, including iron and calcium supplements  . Dairy intake . Dehydration . Abuse of laxatives  Travel  Irritable Bowel Syndrome  Pregnancy  Luteal phase of menstruation (after ovulation and before menses)  Colorectal problems  Intestinal Dysfunction  Treating Constipation  There are several ways of treating constipation, including changes to diet and exercise, use of laxatives, adjustments to the pelvic floor, and scheduled toileting.  These treatments include: . increasing fiber and fluids in the diet  . increasing physical activity . learning muscle coordination   learning proper toileting techniques and toileting modifications   designing and sticking  to  a toileting schedule     2007, [employer] Doc.22   Mediterranean Diet A Mediterranean diet refers to food and lifestyle choices that are based on the traditions of countries located on the [employer]. This way of eating has been shown to help prevent certain conditions and improve outcomes for people who have chronic diseases, like kidney disease and heart disease. What are tips for following this plan? Lifestyle  Cook and eat meals together with your family, when possible.  Drink enough fluid to keep your urine clear or pale yellow.  Be physically active every day. This includes: ? Aerobic exercise like  running or swimming. ? Leisure activities like gardening, walking, or housework.  Get 7-8 hours of sleep each night.  If recommended by your health care provider, drink red wine in moderation. This means 1 glass a day for nonpregnant women and 2 glasses a day for men. A glass of wine equals 5 oz (150 mL). Reading food labels  Check the serving size of packaged foods. For foods such as rice and pasta, the serving size refers to the amount of cooked product, not dry.  Check the total fat in packaged foods. Avoid foods that have saturated fat or trans fats.  Check the ingredients list for added sugars, such as corn syrup. Shopping  At the grocery store, buy most of your food from the areas near the walls of the store. This includes: ? Fresh fruits and vegetables (produce). ? Grains, beans, nuts, and seeds. Some of these may be available in unpackaged forms or large amounts (in bulk). ? Fresh seafood. ? Poultry and eggs. ? Low-fat dairy products.  Buy whole ingredients instead of prepackaged foods.  Buy fresh fruits and vegetables in-season from local farmers markets.  Buy frozen fruits and vegetables in resealable bags.  If you do not have access to quality fresh seafood, buy precooked frozen shrimp or canned fish, such as tuna, salmon, or sardines.  Buy small amounts of raw or cooked vegetables, salads, or olives from the deli or salad bar at your store.  Stock your pantry so you always have certain foods on hand, such as olive oil, canned tuna, canned tomatoes, rice, pasta, and beans. Cooking  Cook foods with extra-virgin olive oil instead of using butter or other vegetable oils.  Have meat as a side dish, and have vegetables or grains as your main dish. This means having meat in small portions or adding small amounts of meat to foods like pasta or stew.  Use beans or vegetables instead of meat in common dishes like chili or lasagna.  Experiment with different cooking methods.  Try roasting or broiling vegetables instead of steaming or sauteing them.  Add frozen vegetables to soups, stews, pasta, or rice.  Add nuts or seeds for added healthy fat at each meal. You can add these to yogurt, salads, or vegetable dishes.  Marinate fish or vegetables using olive oil, lemon juice, garlic, and fresh herbs. Meal planning  Plan to eat 1 vegetarian meal one day each week. Try to work up to 2 vegetarian meals, if possible.  Eat seafood 2 or more times a week.  Have healthy snacks readily available, such as: ? Vegetable sticks with hummus. ? [place] yogurt. ? Fruit and nut trail mix.  Eat balanced meals throughout the week. This includes: ? Fruit: 2-3 servings a day ? Vegetables: 4-5 servings a day ? Low-fat dairy: 2 servings a day ? Fish, poultry, or lean meat: 1 serving  a day ? Beans and legumes: 2 or more servings a week ? Nuts and seeds: 1-2 servings a day ? Whole grains: 6-8 servings a day ? Extra-virgin olive oil: 3-4 servings a day  Limit red meat and sweets to only a few servings a month What are my food choices?  Mediterranean diet ? Recommended ? Grains: Whole-grain pasta. Brown rice. Bulgar wheat. Polenta. Couscous. Whole-wheat bread. [place]. ? Vegetables: Artichokes. Beets. Broccoli. Cabbage. Carrots. Eggplant. Green beans. Chard. Kale. Spinach. Onions. Leeks. Peas. Squash. Tomatoes. Peppers. Radishes. ? Fruits: Apples. Apricots. Avocado. Berries. Bananas. Cherries. Dates. Figs. Grapes. Lemons. Melon. Oranges. Peaches. Plums. Pomegranate. ? Meats and other protein foods: Beans. Almonds. Sunflower seeds. Pine nuts. Peanuts. [place]. Salmon. Scallops. Shrimp. [place]. Tilapia. Clams. Oysters. Eggs. ? Dairy: Low-fat milk. Cheese. Greek yogurt. ? Beverages: Water. Red wine. Herbal tea. ? Fats and oils: Extra virgin olive oil. Avocado oil. Grape seed oil. ? Sweets and desserts: [place] yogurt with honey. Baked apples. Poached pears. Trail mix. ? Seasoning  and other foods: Basil. Cilantro. Coriander. Cumin. Mint. Parsley. Sage. Rosemary. Tarragon. Garlic. Oregano. Thyme. Pepper. Balsalmic vinegar. Tahini. Hummus. Tomato sauce. Olives. Mushrooms. ? Limit these ? Grains: Prepackaged pasta or rice dishes. Prepackaged cereal with added sugar. ? Vegetables: Deep fried potatoes (french fries). ? Fruits: Fruit canned in syrup. ? Meats and other protein foods: Beef. Pork. Lamb. Poultry with skin. Hot dogs. [place] Salines. ? Dairy: Ice cream. Sour cream. Whole milk. ? Beverages: Juice. Sugar-sweetened soft drinks. Beer. Liquor and spirits. ? Fats and oils: Butter. Canola oil. Vegetable oil. Beef fat (tallow). Lard. ? Sweets and desserts: Cookies. Cakes. Pies. Candy. ? Seasoning and other foods: Mayonnaise. Premade sauces and marinades. ? The items listed may not be a complete list. Talk with your dietitian about what dietary choices are right for you. Summary  The Mediterranean diet includes both food and lifestyle choices.  Eat a variety of fresh fruits and vegetables, beans, nuts, seeds, and whole grains.  Limit the amount of red meat and sweets that you eat.  Talk with your health care provider about whether it is safe for you to drink red wine in moderation. This means 1 glass a day for nonpregnant women and 2 glasses a day for men. A glass of wine equals 5 oz (150 mL). This information is not intended to replace advice given to you by your health care provider. Make sure you discuss any questions you have with your health care provider. Document Released: 06/22/2016 Document Revised: 07/25/2016 Document Reviewed: 06/22/2016 Elsevier Interactive Patient Education  [employer].    Why follow it? Research shows. . Those who follow the Mediterranean diet have a reduced risk of heart disease  . The diet is associated with a reduced incidence of Parkinson's and Alzheimer's diseases . People following the diet may have longer life expectancies and lower  rates of chronic diseases  . The Dietary Guidelines for Americans recommends the Mediterranean diet as an eating plan to promote health and prevent disease  What Is the Mediterranean Diet?  . Healthy eating plan based on typical foods and recipes of Mediterranean-style cooking . The diet is primarily a plant based diet; these foods should make up a majority of meals   Starches - Plant based foods should make up a majority of meals - They are an important sources of vitamins, minerals, energy, antioxidants, and fiber - Choose whole grains, foods high in fiber and minimally processed items  - Typical grain sources include  wheat, oats, barley, corn, brown rice, bulgar, farro, millet, polenta, couscous  - Various types of beans include chickpeas, lentils, fava beans, black beans, white beans   Fruits  Veggies - Large quantities of antioxidant rich fruits & veggies; 6 or more servings  - Vegetables can be eaten raw or lightly drizzled with oil and cooked  - Vegetables common to the traditional Mediterranean Diet include: artichokes, arugula, beets, broccoli, brussel sprouts, cabbage, carrots, celery, collard greens, cucumbers, eggplant, kale, leeks, lemons, lettuce, mushrooms, okra, onions, peas, peppers, potatoes, pumpkin, radishes, rutabaga, shallots, spinach, sweet potatoes, turnips, zucchini - Fruits common to the Mediterranean Diet include: apples, apricots, avocados, cherries, clementines, dates, figs, grapefruits, grapes, melons, nectarines, oranges, peaches, pears, pomegranates, strawberries, tangerines  Fats - Replace butter and margarine with healthy oils, such as olive oil, canola oil, and tahini  - Limit nuts to no more than a handful a day  - Nuts include walnuts, almonds, pecans, pistachios, pine nuts  - Limit or avoid candied, honey roasted or heavily salted nuts - Olives are central to the [employer] - can be eaten whole or used in a variety of dishes   Meats Protein -  Limiting red meat: no more than a few times a month - When eating red meat: choose lean cuts and keep the portion to the size of deck of cards - Eggs: approx. 0 to 4 times a week  - Fish and lean poultry: at least 2 a week  - Healthy protein sources include, chicken, [place], lean beef, lamb - Increase intake of seafood such as tuna, salmon, trout, mackerel, shrimp, scallops - Avoid or limit high fat processed meats such as sausage and bacon  Dairy - Include moderate amounts of low fat dairy products  - Focus on healthy dairy such as fat free yogurt, skim milk, low or reduced fat cheese - Limit dairy products higher in fat such as whole or 2% milk, cheese, ice cream  Alcohol - Moderate amounts of red wine is ok  - No more than 5 oz daily for women (all ages) and men older than age 75  - No more than 10 oz of wine daily for men younger than 39  Other - Limit sweets and other desserts  - Use herbs and spices instead of salt to flavor foods  - Herbs and spices common to the traditional Mediterranean Diet include: basil, bay leaves, chives, cloves, cumin, fennel, garlic, lavender, marjoram, mint, oregano, parsley, pepper, rosemary, sage, savory, sumac, tarragon, thyme   It's not just a diet, it's a lifestyle:  . The Mediterranean diet includes lifestyle factors typical of those in the region  . Foods, drinks and meals are best eaten with others and savored . Daily physical activity is important for overall good health . This could be strenuous exercise like running and aerobics . This could also be more leisurely activities such as walking, housework, yard-work, or taking the stairs . Moderation is the key; a balanced and healthy diet accommodates most foods and drinks . Consider portion sizes and frequency of consumption of certain foods   Meal Ideas & Options:  . Breakfast:  o Whole wheat toast or whole wheat English muffins with peanut butter & hard boiled egg o Steel cut oats topped with  apples & cinnamon and skim milk  o Fresh fruit: banana, strawberries, melon, berries, peaches  o Smoothies: strawberries, bananas, greek yogurt, peanut butter o Low fat greek yogurt with blueberries and granola  o Egg white  omelet with spinach and mushrooms o Breakfast couscous: whole wheat couscous, apricots, skim milk, cranberries  . Sandwiches:  o Hummus and grilled vegetables (peppers, zucchini, squash) on whole wheat bread   o Grilled chicken on whole wheat pita with lettuce, tomatoes, cucumbers or tzatziki  o Tuna salad on whole wheat bread: tuna salad made with greek yogurt, olives, red peppers, capers, green onions o Garlic rosemary lamb pita: lamb sauted with garlic, rosemary, salt & pepper; add lettuce, cucumber, greek yogurt to pita - flavor with lemon juice and black pepper  . Seafood:  o Mediterranean grilled salmon, seasoned with garlic, basil, parsley, lemon juice and black pepper o Shrimp, lemon, and spinach whole-grain pasta salad made with low fat greek yogurt  o Seared scallops with lemon orzo  o Seared tuna steaks seasoned salt, pepper, coriander topped with tomato mixture of olives, tomatoes, olive oil, minced garlic, parsley, green onions and cappers  . Meats:  o Herbed greek chicken salad with kalamata olives, cucumber, feta  o Red bell peppers stuffed with spinach, bulgur, lean ground beef (or lentils) & topped with feta   o Kebabs: skewers of chicken, tomatoes, onions, zucchini, squash  o [place] burgers: made with red onions, mint, dill, lemon juice, feta cheese topped with roasted red peppers . Vegetarian o Cucumber salad: cucumbers, artichoke hearts, celery, red onion, feta cheese, tossed in olive oil & lemon juice  o Hummus and whole grain pita points with a greek salad (lettuce, tomato, feta, olives, cucumbers, red onion) o Lentil soup with celery, carrots made with vegetable broth, garlic, salt and pepper  o Tabouli salad: parsley, bulgur, mint, scallions,  cucumbers, tomato, radishes, lemon juice, olive oil, salt and pepper.

## 2017-09-11 LAB — CBC WITH DIFFERENTIAL/PLATELET
BASOS PCT: 0.8 %
Basophils Absolute: 52 cells/uL (ref 0–200)
EOS ABS: 33 {cells}/uL (ref 15–500)
EOS PCT: 0.5 %
HCT: 34.7 % — ABNORMAL LOW (ref 35.0–45.0)
HEMOGLOBIN: 11.1 g/dL — AB (ref 11.7–15.5)
Lymphs Abs: 2269 cells/uL (ref 850–3900)
MCH: 22.7 pg — AB (ref 27.0–33.0)
MCHC: 32 g/dL (ref 32.0–36.0)
MCV: 71 fL — ABNORMAL LOW (ref 80.0–100.0)
MONOS PCT: 6.5 %
MPV: 11 fL (ref 7.5–12.5)
NEUTROS ABS: 3725 {cells}/uL (ref 1500–7800)
Neutrophils Relative %: 57.3 %
PLATELETS: 336 10*3/uL (ref 140–400)
RBC: 4.89 10*6/uL (ref 3.80–5.10)
RDW: 15.4 % — ABNORMAL HIGH (ref 11.0–15.0)
Total Lymphocyte: 34.9 %
WBC mixed population: 423 cells/uL (ref 200–950)
WBC: 6.5 10*3/uL (ref 3.8–10.8)

## 2017-09-11 LAB — LIPID PANEL
CHOLESTEROL: 307 mg/dL — AB (ref <200)
HDL: 93 mg/dL (ref >50)
LDL CHOLESTEROL (CALC): 195 mg/dL — AB
Non-HDL Cholesterol (Calc): 214 mg/dL (calc) — ABNORMAL HIGH (ref <130)
Total CHOL/HDL Ratio: 3.3 (calc) (ref <5.0)
Triglycerides: 80 mg/dL (ref <150)

## 2017-09-11 LAB — COMPREHENSIVE METABOLIC PANEL
AG Ratio: 1.2 (calc) (ref 1.0–2.5)
ALBUMIN MSPROF: 4.6 g/dL (ref 3.6–5.1)
ALKALINE PHOSPHATASE (APISO): 45 U/L (ref 33–115)
ALT: 7 U/L (ref 6–29)
AST: 16 U/L (ref 10–35)
BILIRUBIN TOTAL: 0.5 mg/dL (ref 0.2–1.2)
BUN: 12 mg/dL (ref 7–25)
CALCIUM: 9.8 mg/dL (ref 8.6–10.2)
CO2: 18 mmol/L — ABNORMAL LOW (ref 20–32)
Chloride: 109 mmol/L (ref 98–110)
Creat: 0.98 mg/dL (ref 0.50–1.10)
Globulin: 3.9 g/dL (calc) — ABNORMAL HIGH (ref 1.9–3.7)
Glucose, Bld: 95 mg/dL (ref 65–99)
POTASSIUM: 5 mmol/L (ref 3.5–5.3)
Sodium: 144 mmol/L (ref 135–146)
Total Protein: 8.5 g/dL — ABNORMAL HIGH (ref 6.1–8.1)

## 2017-09-11 LAB — FSH/LH
FSH: 12.8 m[IU]/mL
LH: 7.5 m[IU]/mL

## 2017-09-11 LAB — HIGH SENSITIVITY CRP: HS-CRP: 2.8 mg/L

## 2017-09-11 LAB — H. PYLORI BREATH TEST: H. pylori Breath Test: NOT DETECTED

## 2017-09-11 LAB — VITAMIN D 25 HYDROXY (VIT D DEFICIENCY, FRACTURES): Vit D, 25-Hydroxy: 25 ng/mL — ABNORMAL LOW (ref 30–100)

## 2017-09-11 LAB — TSH: TSH: 1.86 mIU/L

## 2017-09-11 LAB — FERRITIN: Ferritin: 196 ng/mL (ref 10–232)

## 2017-09-12 LAB — COMPREHENSIVE METABOLIC PANEL

## 2017-09-12 LAB — LIPID PANEL

## 2017-09-12 LAB — VITAMIN D 25 HYDROXY (VIT D DEFICIENCY, FRACTURES)

## 2017-09-12 LAB — CBC WITH DIFFERENTIAL/PLATELET

## 2017-09-12 LAB — FERRITIN

## 2017-09-12 LAB — TSH

## 2017-10-08 ENCOUNTER — Telehealth: Payer: Self-pay | Admitting: Family Medicine

## 2017-10-08 NOTE — Telephone Encounter (Signed)
Please advise 

## 2017-10-08 NOTE — Telephone Encounter (Signed)
Copied from [place] [phone]. Topic: Quick Communication - See Telephone Encounter >> Oct 08, 2017  8:30 AM Ether Griffins B wrote: CRM for notification. See Telephone encounter for:  Pt would like to know if she can have her Linzess switched to a generic. Due to the meds costing more than she expected.  10/08/17.

## 2017-10-11 NOTE — Telephone Encounter (Addendum)
It doesn't come in a generic. And there are no similar meds in the same class that are generic either. There is one other med in that class - Amitiza - which she could check with her insurance co to see if that is on a lower copay but I highly doubt it (I checked at the time of her visit and was not cheaper). She can go to [employer] (www.linzess.com or google for "linzess savings coupon" - it looks like she can probably download a savings card from the pharmaceutical company to decrease the copay cost to $30/3 mos supply.

## 2017-10-11 NOTE — Telephone Encounter (Signed)
Spoke with pt.  Pharmacy went to the website and the $30/29mo was actually $78+ for 13mo. She will discuss next time with Dr. Brigitte Pulse.  In meantime, she will find a way to pay for it.

## 2017-12-18 ENCOUNTER — Ambulatory Visit
Admission: RE | Admit: 2017-12-18 | Discharge: 2017-12-18 | Disposition: A | Discharge status: Home / Self Care | Payer: BLUE CROSS/BLUE SHIELD | Source: Ambulatory Visit | Attending: Family Medicine | Admitting: Family Medicine

## 2017-12-18 DIAGNOSIS — Z1231 Encounter for screening mammogram for malignant neoplasm of breast: Secondary | ICD-10-CM

## 2018-04-11 ENCOUNTER — Ambulatory Visit: Payer: Self-pay | Admitting: *Deleted

## 2018-04-11 ENCOUNTER — Ambulatory Visit: Payer: BLUE CROSS/BLUE SHIELD | Admitting: Family Medicine

## 2018-04-11 ENCOUNTER — Encounter: Payer: Self-pay | Admitting: Family Medicine

## 2018-04-11 ENCOUNTER — Ambulatory Visit: Payer: Self-pay | Admitting: Family Medicine

## 2018-04-11 ENCOUNTER — Other Ambulatory Visit: Payer: Self-pay

## 2018-04-11 VITALS — BP 162/95 | HR 93 | Temp 99.5°F | Resp 18 | Ht 67.0 in | Wt 138.2 lb

## 2018-04-11 DIAGNOSIS — E7800 Pure hypercholesterolemia, unspecified: Secondary | ICD-10-CM | POA: Diagnosis not present

## 2018-04-11 DIAGNOSIS — D500 Iron deficiency anemia secondary to blood loss (chronic): Secondary | ICD-10-CM | POA: Diagnosis not present

## 2018-04-11 DIAGNOSIS — E559 Vitamin D deficiency, unspecified: Secondary | ICD-10-CM | POA: Diagnosis not present

## 2018-04-11 DIAGNOSIS — Z8249 Family history of ischemic heart disease and other diseases of the circulatory system: Secondary | ICD-10-CM

## 2018-04-11 DIAGNOSIS — R002 Palpitations: Secondary | ICD-10-CM

## 2018-04-11 MED ORDER — TRAZODONE HCL 50 MG PO TABS
25.0000 mg | ORAL_TABLET | Freq: Every evening | ORAL | 3 refills | Status: DC | PRN
Start: 2018-04-11 — End: 2018-05-03

## 2018-04-11 MED ORDER — CITALOPRAM HYDROBROMIDE 20 MG PO TABS: 10.0000 mg | ORAL_TABLET | Freq: Every day | ORAL | 0 refills | Status: DC

## 2018-04-11 NOTE — Telephone Encounter (Signed)
Jessica [name] began having heart palpitations about one week ago. She describes it as her heart racing. She also has a "slight heaviness"in her chest, tingling in both arms and hands, and a foggy head during these heart racing episodes. They have occurred every night this week and again this morning. Lasting approx. 5 minutes and resolves with deep breathing. She has been woken from sleep once with this. Positive for nausea when it occurs. She has had this occur years ago and was told it was panic attacks. Saw behavioral therapist and placed on medications in the past. She has not taken these medications for years. PCP unavailable, scheduled afternoon appointment today.  Reason for Disposition . [1] Heart beating very rapidly (e.g., > 140 / minute) AND [2] not present now  (Exception: during exercise)  Answer Assessment - Initial Assessment Questions 1. DESCRIPTION: "Please describe your heart rate or heart beat that you are having" (e.g., fast/slow, regular/irregular, skipped or extra beats, "palpitations")   Heart rate speeds up really fast. 2. ONSET: "When did it start?" (Minutes, hours or days)      For one week 3. DURATION: "How long does it last" (e.g., seconds, minutes, hours)     5 minutes then slows down 4. PATTERN "Does it come and go, or has it been constant since it started?"  "Does it get worse with exertion?"   "Are you feeling it now?"     Comes and goes.gets worse at night. Feels okay right now. Does not happen on exertion.  5. TAP: "Using your hand, can you tap out what you are feeling on a chair or table in front of you, so that I can hear?" (Note: not all patients can do this)       Feels fine now. Had an episode earlier this morning . 6. HEART RATE: "Can you tell me your heart rate?" "How many beats in 15 seconds?"  (Note: not all patients can do this)       88 bpm 7. RECURRENT SYMPTOM: "Have you ever had this before?" If so, ask: "When was the last time?" and "What happened  that time?"      Years ago and was told it was a panic attack.  8. CAUSE: "What do you think is causing the palpitations?"     Maybe stress. Really unsure.  9. CARDIAC HISTORY: "Do you have any history of heart disease?" (e.g., heart attack, angina, bypass surgery, angioplasty, arrhythmia)      no 10. OTHER SYMPTOMS: "Do you have any other symptoms?" (e.g., dizziness, chest pain, sweating, difficulty breathing)       Chest feels slightly heavy when she notices the palpitations. Tingling begins in both arms and hands with the palpitations. Foggy headed with episodes. All resolves when the palpitations slow down.  11. PREGNANCY: "Is there any chance you are pregnant?" "When was your last menstrual period?"       no  Protocols used: HEART RATE AND HEARTBEAT QUESTIONS-A-AH

## 2018-04-11 NOTE — Progress Notes (Signed)
Chief Complaint  Patient presents with  . Palpitations    onset: couple years but over past 1 1/2 week she's been having them at night and had it this morning.    HPI   Pt reports that she is having monthly periods She states that she gets palpitations at night which can last 5 - 10 She reports that she has a history of depression  She reports that her mother had a fatal heart attack at age 47 Her maternal grandmother also had a heart attack at age 38   Depression screen PHQ 2/9 04/11/2018 04/11/2018 09/10/2017 08/31/2016 07/30/2015  Decreased Interest 2 0 1 0 0  Down, Depressed, Hopeless 2 0 1 1 0  PHQ - 2 Score 4 0 2 1 0  Altered sleeping 3 - 1 - -  Tired, decreased energy 1 - 1 - -  Change in appetite 0 - 0 - -  Feeling bad or failure about yourself  2 - 1 - -  Trouble concentrating 2 - 0 - -  Moving slowly or fidgety/restless 0 - 1 - -  Suicidal thoughts 0 - 0 - -  PHQ-9 Score 12 - 6 - -  Difficult doing work/chores Somewhat difficult - Somewhat difficult - -   GAD 7 : Generalized Anxiety Score 04/11/2018  Nervous, Anxious, on Edge 2  Control/stop worrying 3  Worry too much - different things 3  Trouble relaxing 2  Restless 0  Easily annoyed or irritable 1  Afraid - awful might happen 2  Total GAD 7 Score 13  Anxiety Difficulty Somewhat difficult     Past Medical History:  Diagnosis Date  . Allergy   . Anemia   . Anxiety   . Depression     Current Outpatient Medications  Medication Sig Dispense Refill  . ferrous sulfate 325 (65 FE) MG tablet Take 325 mg by mouth daily with breakfast.    . pravastatin (PRAVACHOL) 40 MG tablet Take 1 tablet (40 mg total) by mouth daily. 90 tablet 3  . linaclotide (LINZESS) 145 MCG CAPS capsule Take 1 capsule (145 mcg total) by mouth daily before breakfast. (Patient not taking: Reported on 04/11/2018) 30 capsule 3   No current facility-administered medications for this visit.     Allergies:  Allergies  Allergen Reactions   . Sulfonamide Derivatives     REACTION: hives    Past Surgical History:  Procedure Laterality Date  . TUBAL LIGATION      Social History   Socioeconomic History  . Marital status: Married    Spouse name: Not on file  . Number of children: Not on file  . Years of education: Not on file  . Highest education level: Not on file  Occupational History  . Not on file  Social Needs  . Financial resource strain: Not on file  . Food insecurity:    Worry: Not on file    Inability: Not on file  . Transportation needs:    Medical: Not on file    Non-medical: Not on file  Tobacco Use  . Smoking status: Never Smoker  . Smokeless tobacco: Never Used  Substance and Sexual Activity  . Alcohol use: No    Alcohol/week: 0.0 oz  . Drug use: No  . Sexual activity: Yes  Lifestyle  . Physical activity:    Days per week: Not on file    Minutes per session: Not on file  . Stress: Not on file  Relationships  . Social  connections:    Talks on phone: Not on file    Gets together: Not on file    Attends religious service: Not on file    Active member of club or organization: Not on file    Attends meetings of clubs or organizations: Not on file    Relationship status: Not on file  Other Topics Concern  . Not on file  Social History Narrative  . Not on file    Family History  Problem Relation Age of Onset  . Alcohol abuse Father   . Drug abuse Father   . Hyperlipidemia Father   . Hypertension Father   . Alcohol abuse Maternal Uncle   . Heart disease Mother   . Hyperlipidemia Mother   . Hypertension Mother   . Heart disease Maternal Grandmother   . Hypertension Maternal Grandmother   . Hyperlipidemia Maternal Grandmother   . Cancer Paternal Grandmother   . Diabetes Paternal Grandmother   . Hypertension Paternal Grandmother   . Cancer Paternal Grandfather   . Breast cancer Neg Hx      ROS Review of Systems See HPI Constitution: No fevers or chills No malaise No  diaphoresis Skin: No rash or itching Eyes: no blurry vision, no double vision GU: no dysuria or hematuria Neuro: no dizziness or headaches * all others reviewed and negative   Objective: Vitals:   04/11/18 1709  BP: (!) 162/95  Pulse: 93  Resp: 18  Temp: 99.5 F (37.5 C)  TempSrc: Oral  SpO2: 100%  Weight: 138 lb 3.2 oz (62.7 kg)  Height: 5\' 7"  (1.702 m)    Physical Exam  Constitutional: She is oriented to person, place, and time. She appears well-developed and well-nourished.  HENT:  Head: Normocephalic and atraumatic.  Eyes: Conjunctivae and EOM are normal.  Neck: Normal range of motion. No thyromegaly present.  Cardiovascular: Normal rate, regular rhythm and normal heart sounds.  No murmur heard. Pulmonary/Chest: Effort normal and breath sounds normal. No stridor. No respiratory distress.  Neurological: She is alert and oriented to person, place, and time.  Skin: Skin is warm. Capillary refill takes less than 2 seconds.  Psychiatric: She has a normal mood and affect. Her behavior is normal. Judgment and thought content normal.    Assessment and Plan Mehak was seen today for palpitations.  Diagnoses and all orders for this visit:  Heart palpitations -     EKG 12-Lead -     Comprehensive metabolic panel -     ECHOCARDIOGRAM STRESS TEST; Future  Family history of heart attack-  -     ECHOCARDIOGRAM STRESS TEST; Future  Discussed that since she has a history of anxiety and depression as well hot flashes this could all be psychological however patient's mother had an unprovoked fatal MI at the age of 33 There is a strong family history on the maternal side Would do a cardiac echo to rule out any hereditary causes If work is negative for cardiac origin will use herbals such as evening primrose and SSRI but would avoid benzodiazepines  Iron deficiency anemia due to chronic blood loss- continue iron  Will assess levels -     CBC  Vitamin D deficiency- improved   Will monitor -     VITAMIN D 25 Hydroxy (Vit-D Deficiency, Fractures)  HYPERCHOLESTEROLEMIA- risk factors reviewed -     Lipid panel -     ECHOCARDIOGRAM STRESS TEST; Future   A total of 30 minutes were spent face-to-face with the patient  during this encounter and over half of that time was spent on counseling and coordination of care.   Goldonna

## 2018-04-11 NOTE — Patient Instructions (Signed)
     IF you received an x-ray today, you will receive an invoice from [facility]. Please contact [facility] at [phone] with questions or concerns regarding your invoice.   IF you received labwork today, you will receive an invoice from [facility]. Please contact [facility] at [phone] with questions or concerns regarding your invoice.   Our billing staff will not be able to assist you with questions regarding bills from these companies.  You will be contacted with the lab results as soon as they are available. The fastest way to get your results is to activate your My Chart account. Instructions are located on the last page of this paperwork. If you have not heard from us regarding the results in 2 weeks, please contact this office.     

## 2018-04-12 LAB — COMPREHENSIVE METABOLIC PANEL
ALK PHOS: 52 IU/L (ref 39–117)
ALT: 13 IU/L (ref 0–32)
AST: 16 IU/L (ref 0–40)
Albumin/Globulin Ratio: 1.3 (ref 1.2–2.2)
Albumin: 4.8 g/dL (ref 3.5–5.5)
BILIRUBIN TOTAL: 0.3 mg/dL (ref 0.0–1.2)
BUN/Creatinine Ratio: 11 (ref 9–23)
BUN: 11 mg/dL (ref 6–24)
CHLORIDE: 104 mmol/L (ref 96–106)
CO2: 20 mmol/L (ref 20–29)
Calcium: 10.1 mg/dL (ref 8.7–10.2)
Creatinine, Ser: 0.96 mg/dL (ref 0.57–1.00)
GFR calc non Af Amer: 71 mL/min/{1.73_m2} (ref >59)
GFR, EST AFRICAN AMERICAN: 81 mL/min/{1.73_m2} (ref >59)
GLUCOSE: 95 mg/dL (ref 65–99)
Globulin, Total: 3.7 g/dL (ref 1.5–4.5)
Potassium: 4.8 mmol/L (ref 3.5–5.2)
Sodium: 141 mmol/L (ref 134–144)
TOTAL PROTEIN: 8.5 g/dL (ref 6.0–8.5)

## 2018-04-12 LAB — LIPID PANEL
CHOL/HDL RATIO: 2.5 ratio (ref 0.0–4.4)
Cholesterol, Total: 231 mg/dL — ABNORMAL HIGH (ref 100–199)
HDL: 94 mg/dL (ref >39)
LDL Calculated: 129 mg/dL — ABNORMAL HIGH (ref 0–99)
Triglycerides: 38 mg/dL (ref 0–149)
VLDL Cholesterol Cal: 8 mg/dL (ref 5–40)

## 2018-04-12 LAB — CBC
HEMATOCRIT: 35.3 % (ref 34.0–46.6)
HEMOGLOBIN: 11.4 g/dL (ref 11.1–15.9)
MCH: 23 pg — ABNORMAL LOW (ref 26.6–33.0)
MCHC: 32.3 g/dL (ref 31.5–35.7)
MCV: 71 fL — ABNORMAL LOW (ref 79–97)
Platelets: 331 10*3/uL (ref 150–450)
RBC: 4.96 x10E6/uL (ref 3.77–5.28)
RDW: 17.3 % — ABNORMAL HIGH (ref 12.3–15.4)
WBC: 7.1 10*3/uL (ref 3.4–10.8)

## 2018-04-12 LAB — VITAMIN D 25 HYDROXY (VIT D DEFICIENCY, FRACTURES): Vit D, 25-Hydroxy: 20.8 ng/mL — ABNORMAL LOW (ref 30.0–100.0)

## 2018-04-19 ENCOUNTER — Telehealth: Payer: Self-pay | Admitting: Family Medicine

## 2018-04-19 NOTE — Telephone Encounter (Signed)
Checked with pt insurance to make sure they dont require a prior auth for pt to have an echo done checked online and it doesn't required a prior auth routed pt echo order to [facility] at fax number [phone] called pt to let her know the process

## 2018-04-22 NOTE — Telephone Encounter (Signed)
That is okay. Schedule it please.

## 2018-04-22 NOTE — Telephone Encounter (Signed)
[facility] is scheduling out until July for pt to have an echo. Pt would like to know is this okay or should we try to find something sooner elsewhere? I told [facility] to proceed with the order and I would check with you!

## 2018-04-23 NOTE — Telephone Encounter (Signed)
Jessica [name] faxed paperwork from [facility] stating prior auth not required 6/11

## 2018-04-23 NOTE — Telephone Encounter (Signed)
Juliann Pulse w/ Dr. Thurman Coyer office is calling in because they received the order for pt's echo but they are unable to determine if it was approved by pt's insurance. Juliann Pulse would like to have the ref # for the insurance.   Call: [phone] -

## 2018-04-25 ENCOUNTER — Telehealth: Payer: Self-pay | Admitting: Family Medicine

## 2018-04-25 NOTE — Telephone Encounter (Signed)
MyChart message sent to pt about rescheduling her 05/02/18 apt with [facility].

## 2018-04-29 ENCOUNTER — Ambulatory Visit: Payer: BLUE CROSS/BLUE SHIELD | Admitting: Family Medicine

## 2018-05-01 ENCOUNTER — Encounter: Payer: Self-pay | Admitting: Family Medicine

## 2018-05-01 ENCOUNTER — Other Ambulatory Visit: Payer: Self-pay

## 2018-05-01 ENCOUNTER — Ambulatory Visit: Payer: BLUE CROSS/BLUE SHIELD | Admitting: Family Medicine

## 2018-05-01 VITALS — BP 138/72 | HR 72 | Temp 98.2°F | Resp 17 | Ht 67.0 in | Wt 138.2 lb

## 2018-05-01 DIAGNOSIS — R002 Palpitations: Secondary | ICD-10-CM | POA: Diagnosis not present

## 2018-05-01 DIAGNOSIS — D500 Iron deficiency anemia secondary to blood loss (chronic): Secondary | ICD-10-CM | POA: Diagnosis not present

## 2018-05-01 MED ORDER — VENLAFAXINE HCL 25 MG PO TABS: 25.0000 mg | ORAL_TABLET | Freq: Two times a day (BID) | ORAL | 0 refills | Status: DC

## 2018-05-01 NOTE — Progress Notes (Signed)
Chief Complaint  Patient presents with  . follow  up from 04/11/18    per pt having side effects from celexa and trazodone, having lightheadedness, dizziness, coughing and racing heart rate almost like she is having a panic attack.  Per pt ha last night and took ibuprofen for the pain.  Pt would like celexa ande desyrel changed to something else    HPI   She reports that she has been having coughing since starting the citalopram  She reports that she also continues to feel jittery but less like she is going to have a panic attack  She reports that she has been also having light    She reports that she does not have very heavy periods Her periods are bright red She states that she has been having perimenopause    BP Readings from Last 3 Encounters:  05/01/18 (!) 146/83  04/11/18 (!) 162/95  09/10/17 138/82      Past Medical History:  Diagnosis Date  . Allergy   . Anemia   . Anxiety   . Depression     Current Outpatient Medications  Medication Sig Dispense Refill  . pravastatin (PRAVACHOL) 40 MG tablet Take 1 tablet (40 mg total) by mouth daily. 90 tablet 3  . traZODone (DESYREL) 50 MG tablet Take 0.5-1 tablets (25-50 mg total) by mouth at bedtime as needed for sleep. 30 tablet 3  . linaclotide (LINZESS) 145 MCG CAPS capsule Take 1 capsule (145 mcg total) by mouth daily before breakfast. (Patient not taking: Reported on 04/11/2018) 30 capsule 3  . venlafaxine (EFFEXOR) 25 MG tablet Take 1 tablet (25 mg total) by mouth 2 (two) times daily with a meal. 60 tablet 0   No current facility-administered medications for this visit.     Allergies:  Allergies  Allergen Reactions  . Sulfonamide Derivatives     REACTION: hives    Past Surgical History:  Procedure Laterality Date  . TUBAL LIGATION      Social History   Socioeconomic History  . Marital status: Married    Spouse name: Not on file  . Number of children: Not on file  . Years of education: Not on file  .  Highest education level: Not on file  Occupational History  . Not on file  Social Needs  . Financial resource strain: Not on file  . Food insecurity:    Worry: Not on file    Inability: Not on file  . Transportation needs:    Medical: Not on file    Non-medical: Not on file  Tobacco Use  . Smoking status: Never Smoker  . Smokeless tobacco: Never Used  Substance and Sexual Activity  . Alcohol use: No    Alcohol/week: 0.0 oz  . Drug use: No  . Sexual activity: Yes  Lifestyle  . Physical activity:    Days per week: Not on file    Minutes per session: Not on file  . Stress: Not on file  Relationships  . Social connections:    Talks on phone: Not on file    Gets together: Not on file    Attends religious service: Not on file    Active member of club or organization: Not on file    Attends meetings of clubs or organizations: Not on file    Relationship status: Not on file  Other Topics Concern  . Not on file  Social History Narrative  . Not on file    Family History  Problem  Relation Age of Onset  . Alcohol abuse Father   . Drug abuse Father   . Hyperlipidemia Father   . Hypertension Father   . Alcohol abuse Maternal Uncle   . Heart disease Mother   . Hyperlipidemia Mother   . Hypertension Mother   . Heart disease Maternal Grandmother   . Hypertension Maternal Grandmother   . Hyperlipidemia Maternal Grandmother   . Cancer Paternal Grandmother   . Diabetes Paternal Grandmother   . Hypertension Paternal Grandmother   . Cancer Paternal Grandfather   . Breast cancer Neg Hx      ROS Review of Systems See HPI Constitution: No fevers or chills No malaise No diaphoresis Skin: No rash or itching Eyes: no blurry vision, no double vision GU: no dysuria or hematuria Neuro: no dizziness or headaches all others reviewed and negative   Objective: Vitals:   05/01/18 1414  BP: (!) 146/83  Pulse: 72  Resp: 17  Temp: 98.2 F (36.8 C)  TempSrc: Oral  SpO2: 100%    Weight: 138 lb 3.2 oz (62.7 kg)  Height: 5\' 7"  (1.702 m)    Physical Exam Physical Exam  Constitutional: She is oriented to person, place, and time. She appears well-developed and well-nourished.  HENT:  Head: Normocephalic and atraumatic.  Eyes: Conjunctivae and EOM are normal.  Cardiovascular: Normal rate, regular rhythm and normal heart sounds.   Pulmonary/Chest: Effort normal and breath sounds normal. No respiratory distress. She has no wheezes.  Abdominal: Normal appearance and bowel sounds are normal. There is no tenderness. There is no CVA tenderness.  Neurological: She is alert and oriented to person, place, and time.   Assessment and Plan Kayana was seen today for follow  up from 04/11/18.  Diagnoses and all orders for this visit:  Iron deficiency anemia due to chronic blood loss -     Ambulatory referral to Hematology  Heart palpitations  Other orders -     venlafaxine (EFFEXOR) 25 MG tablet; Take 1 tablet (25 mg total) by mouth 2 (two) times daily with a meal.  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

## 2018-05-01 NOTE — Patient Instructions (Addendum)
Slow FE iron over the counter   IF you received an x-ray today, you will receive an invoice from [facility]. Please contact [facility] at [phone] with questions or concerns regarding your invoice.   IF you received labwork today, you will receive an invoice from [facility]. Please contact [facility] at [phone] with questions or concerns regarding your invoice.   Our billing staff will not be able to assist you with questions regarding bills from these companies.  You will be contacted with the lab results as soon as they are available. The fastest way to get your results is to activate your My Chart account. Instructions are located on the last page of this paperwork. If you have not heard from [place] regarding the results in 2 weeks, please contact this office.    Iron Level and Total Iron-Binding Capacity Tests Iron comes from your diet and binds to a part of the red blood cell called hemoglobin. Hemoglobin carries iron throughout your body. In order for iron to bind to hemoglobin, it must first be carried from your small intestine into your bone marrow by proteins called transferrin. Iron level and total iron-binding capacity (TIBC) are tests that determine how much iron you have in your blood (serum iron level), and the amount of transferrin that is available to transport iron to the bone marrow (iron-binding capacity). These tests are often done together. They may be done to:  Diagnose iron-deficiency anemia.  Monitor treatment for iron-deficiency anemia.  Monitor the progression of conditions that cause your body to break down red blood cells more quickly than normal.  You may have these tests if:  You are pregnant.  You have had a blood test that showed abnormal red blood cell numbers, size, or color.  Your health care provider suspects iron overload, iron poisoning, or low levels of iron due to blood loss.  How do I prepare for this test? Do not eat or  drink anything after midnight on the night before the procedure or as directed by your health care provider. What do the results mean? It is your responsibility to obtain your test results. Ask the lab or department performing the test when and how you will get your results. Talk with your health care provider if you have any questions about your results. Range of Normal Values Ranges for normal values may vary among different labs and hospitals. You should always check with your health care provider after having lab work or other tests done to discuss whether your values are considered within normal limits. The normal findings for each test are listed here: Iron  Female: 80-180 microgram/dL or 14-32 micromole/L (SI units).  Female: 60-160 microgram/dL or 11-29 micromole/L (SI units).  Newborn: 100-250 microgram/dL.  Child: 50-120 microgram/dL. TIBC  250-460 microgram/dL or 45-82 micromole/L (SI units). Transferrin  Adult female: 215-365 mg/dL or 2.15-3.65 g/L (SI units).  Adult female: 250-380 mg/dL or 2.5-3.8 g/L (SI units).  Newborn: 130-275 mg/dL.  Child: 203-360 mg/dL. Transferrin saturation  Female: 20-50%.  Female: 15-50%. Many factors can affect iron level and TIBC test results. These may include:  Recent blood transfusions.  Recently eating a meal that contains high levels of iron.  Certain medicines.  Meaning of Results Outside Normal Value Ranges Serum iron levels that are higher than normal may be related to many health conditions, including:  Genetic disorders that cause an increase in iron levels, such as hemosiderosis and hemochromatosis.  Iron poisoning.  Hemolytic anemia.  Liver diseases such  as hepatitis and hepatic necrosis.  Lead poisoning.  Serum iron levels that are lower than normal may be related to many health conditions, including:  Poor dietary iron intake.  Long-term blood loss.  Insufficient absorption of iron in the small  intestine.  Pregnancy.  Iron-deficiency anemia.  Tumors.  Abnormally high TIBC may be related to many health conditions, including:  Estrogen therapy.  Pregnancy.  Polycythemia vera.  Iron-deficiency anemia.  Abnormally low TIBC may be related to many health conditions, including:  Malnutrition.  Hypoproteinemia.  Conditions that cause swelling (inflammation) throughout the body.  Cirrhosis of the liver.  Some anemias, including hemolytic, pernicious, and sickle cell anemias.  Discuss your test results with your health care provider. He or she will use the results to make a diagnosis and determine a treatment plan that is right for you. Talk with your health care provider to discuss your results, treatment options, and if necessary, the need for more tests. Talk with your health care provider if you have any questions about your results. This information is not intended to replace advice given to you by your health care provider. Make sure you discuss any questions you have with your health care provider. Document Released: 11/23/2004 Document Revised: 07/05/2016 Document Reviewed: 03/18/2014 Elsevier Interactive Patient Education  2018 [employer].

## 2018-05-02 ENCOUNTER — Ambulatory Visit: Payer: BLUE CROSS/BLUE SHIELD | Admitting: Family Medicine

## 2018-05-03 ENCOUNTER — Other Ambulatory Visit: Payer: Self-pay | Admitting: Family Medicine

## 2018-05-03 NOTE — Telephone Encounter (Signed)
Trazadone refill Last Refill:04/11/18 #30 Last OV: 05/01/18 PCP: Dr. Nolon Rod Pharmacy:[facility] [address]

## 2018-05-03 NOTE — Telephone Encounter (Signed)
Patient is requesting a refill of the following medications: Requested Prescriptions   Pending Prescriptions Disp Refills  . traZODone (DESYREL) 50 MG tablet [Pharmacy Med Name: TRAZODONE 50 MG TABLET] 90 tablet 2    Sig: TAKE 0.5-1 TABLETS (25-50 MG TOTAL) BY MOUTH AT BEDTIME AS NEEDED FOR SLEEP.    Date of patient request:05/03/18 Last office visit: 05/01/18 Date of last refill: 50/30/19 Last refill amount: 30 Follow up time period per chart: n/a

## 2018-05-13 ENCOUNTER — Ambulatory Visit: Payer: BLUE CROSS/BLUE SHIELD | Admitting: Family Medicine

## 2018-05-15 ENCOUNTER — Encounter: Payer: Self-pay | Admitting: Oncology

## 2018-05-15 ENCOUNTER — Telehealth: Payer: Self-pay | Admitting: Oncology

## 2018-05-15 NOTE — Telephone Encounter (Signed)
New hematology referral from Dr. Nolon Rod at [facility] for iron deficiency anemia. Pt has been scheduled to see Mikey Bussing on 7/24 at 1pm. Pt aware to arrive 30 minutes early. Letter mailed.

## 2018-05-23 ENCOUNTER — Other Ambulatory Visit: Payer: Self-pay | Admitting: Family Medicine

## 2018-05-23 NOTE — Telephone Encounter (Signed)
Venlafaxine  refill Last Refill:05/01/18 # 60 Last OV: 05/01/18 PCP: Dr Brigitte Pulse Pharmacy:[facility].

## 2018-06-04 NOTE — Progress Notes (Signed)
[employer] [employer] [phone]   Fax:(336) [place]  Shawnee Knapp, MD  [address] [place] [address]   REFERRING PHYSICIAN: Dr. Delia Chimes   REASON FOR CONSULTATION: Anemia  HPI: Jessica [name] is a 47 y.o. female with a past medical history including anxiety, depression, and hyperlipidemia.  She was referred to [place] for anemia.  She reports that she is been anemic for at least 28 years when she was pregnant with her first child.  She has been on oral iron since that time.  She currently takes Slow Fe 1 tablet daily.  She tolerates this fairly well but does have constipation related to the medication.  She denies any history of blood transfusions or receiving IV iron in the past.  Most recent hemoglobin was 11.4 and the MCV was low at 71.  Review of her labs show that her hemoglobin has ranged from 9.5 up to 12.6 over the past 9 years. Today, the patient reports that she has been having fatigue which has worsened over the past year.  She has also had palpitations which were thought to be related to her medications for depression and anxiety.  She has had an echocardiogram which was normal.  She also reports mild dyspnea on exertion and intermittent dizziness.  She has had headaches on and off for the past few days.  She uses Tylenol.  She reports that 2 months ago she had night sweats which stopped on their own.  She denies fevers and chills.  Denies chest pain, shortness of breath at rest, cough, hemoptysis.  Denies nausea, vomiting, diarrhea.  Denies recent weight loss.  Denies epistaxis, bleeding gums, hematuria. The patient denies family history of anemia, sickle cell disease or sickle cell trait, blood clots, and cancer.  She reports that her father died in his early 46s from a drug overdose and her mother died age 21 due to an MI.  She reports that her grandmother had heart disease, hypertension, hyperlipidemia, and rheumatoid arthritis. The  patient is married and has 2 daughters.  She is currently on disability due to her anxiety and depression but previously worked as a Automotive engineer.  Denies history of tobacco, alcohol, and illicit drug use.  The patient is accompanied to the office today by her daughter.   Past Medical History:  Diagnosis Date  . Allergy   . Anemia   . Anxiety   . Depression   . Hyperlipidemia   :    Past Surgical History:  Procedure Laterality Date  . TUBAL LIGATION    :   CURRENT MEDS: Current Outpatient Medications  Medication Sig Dispense Refill  . linaclotide (LINZESS) 145 MCG CAPS capsule Take 1 capsule (145 mcg total) by mouth daily before breakfast. 30 capsule 3  . pravastatin (PRAVACHOL) 40 MG tablet Take 1 tablet (40 mg total) by mouth daily. 90 tablet 3  . traZODone (DESYREL) 50 MG tablet TAKE 0.5-1 TABLETS (25-50 MG TOTAL) BY MOUTH AT BEDTIME AS NEEDED FOR SLEEP. 90 tablet 2  . venlafaxine (EFFEXOR) 25 MG tablet TAKE 1 TABLET (25 MG TOTAL) BY MOUTH 2 (TWO) TIMES DAILY WITH A MEAL. 180 tablet 1   No current facility-administered medications for this visit.       Allergies  Allergen Reactions  . Sulfonamide Derivatives     REACTION: hives             Father   .  Hyperlipidemia Father   . Hypertension Father   . Alcohol abuse Maternal Uncle   . Heart disease Mother   . Hyperlipidemia Mother   . Hypertension Mother   . Heart disease Maternal Grandmother   . Hypertension Maternal Grandmother   . Hyperlipidemia Maternal Grandmother   . Cancer Paternal Grandmother   . Diabetes Paternal Grandmother   . Hypertension Paternal Grandmother   . Cancer Paternal Grandfather   . Breast cancer Neg Hx      Socioeconomic History  . Marital status: Married    Spouse name: Not on file  . Number of children: Not on file  . Years of education: Not on file  . Highest education level: Not on file  Occupational History  . Not on file  Social Needs  . Financial resource  strain: Not on file  . Food insecurity:    Worry: Not on file    Inability: Not on file  . Transportation needs:    Medical: Not on file    Non-medical: Not on file  Tobacco Use  . Smoking status: Never Smoker  . Smokeless tobacco: Never Used  Substance and Sexual Activity  . Alcohol use: No    Alcohol/week: 0.0 oz  . Drug use: No  . Sexual activity: Yes  Lifestyle  . Physical activity:    Days per week: Not on file    Minutes per session: Not on file  . Stress: Not on file  Relationships  . Social connections:    Talks on phone: Not on file    Gets together: Not on file    Attends religious service: Not on file    Active member of club or organization: Not on file    Attends meetings of clubs or organizations: Not on file    Relationship status: Not on file  . Intimate partner violence:    Fear of current or ex partner: Not on file    Emotionally abused: Not on file    Physically abused: Not on file    Forced sexual activity: Not on file  Other Topics Concern  . Not on file  Social History Narrative  . Not on file  :  REVIEW OF SYSTEMS:   Constitutional: Negative for appetite change, chills, fever and unexpected weight change.  Positive for fatigue. HENT:   Negative for mouth sores, nosebleeds, sore throat and trouble swallowing.   Eyes: Negative for eye problems and icterus.  Respiratory: Negative for cough, hemoptysis, shortness of breath at rest and wheezing.  Positive for mild shortness of breath with exertion.  Cardiovascular: Negative for chest pain and leg swelling.  Positive for palpitations. Gastrointestinal: Negative for abdominal pain, diarrhea, nausea and vomiting.  Positive for constipation related to her oral iron. Genitourinary: Negative for bladder incontinence, difficulty urinating, dysuria, frequency and hematuria.   Musculoskeletal: Negative for back pain, gait problem, neck pain and neck stiffness.  Skin: Negative for itching and rash.    Neurological: Negative for dizziness, extremity weakness, gait problem, light-headedness and seizures.  Positive for headaches. Hematological: Negative for adenopathy. Does not bruise/bleed easily.  Psychiatric/Behavioral: Negative for confusion.  She is currently being treated for anxiety and depression.    PHYSICAL EXAMINATION: Blood pressure (!) 148/83, pulse 77, temperature 98.4 F (36.9 C), temperature source Oral, resp. rate 18, height '5\' 7"'  (1.702 m), weight 141 lb 4.8 oz (64.1 kg), SpO2 100 %.  ECOG PERFORMANCE STATUS: 1 - Symptomatic but completely ambulatory  Physical Exam  Constitutional: Oriented to  person, place, and time and well-developed, well-nourished, and in no distress. No distress.  HENT:  Head: Normocephalic and atraumatic.  Mouth/Throat: Oropharynx is clear and moist. No oropharyngeal exudate.  Eyes: Conjunctivae are normal. Right eye exhibits no discharge. Left eye exhibits no discharge. No scleral icterus.  Neck: Normal range of motion. Neck supple.  Cardiovascular: Normal rate, regular rhythm, normal heart sounds and intact distal pulses.   Pulmonary/Chest: Effort normal and breath sounds normal. No respiratory distress. No wheezes. No rales.  Abdominal: Soft. Bowel sounds are normal. Exhibits no distension and no mass. There is no tenderness.  Musculoskeletal: Normal range of motion. Exhibits no edema.  Lymphadenopathy:    No cervical adenopathy.  Neurological: Alert and oriented to person, place, and time. Exhibits normal muscle tone. Gait normal. Coordination normal.  Skin: Skin is warm and dry. No rash noted. Not diaphoretic. No erythema. No pallor.  Psychiatric: Mood, memory and judgment normal.  Vitals reviewed.    LABS:  Lab Results  Component Value Date   WBC 7.2 06/05/2018   HGB 11.2 (L) 06/05/2018   HCT 35.2 06/05/2018   PLT 290 06/05/2018   GLUCOSE 95 04/11/2018   CHOL 231 (H) 04/11/2018   TRIG 38 04/11/2018   HDL 94 04/11/2018   LDLCALC  129 (H) 04/11/2018   ALT 13 04/11/2018   AST 16 04/11/2018   NA 141 04/11/2018   K 4.8 04/11/2018   CL 104 04/11/2018   CREATININE 0.96 04/11/2018   BUN 11 04/11/2018   CO2 20 04/11/2018    No results found.  ASSESSMENT: This is a pleasant 47 year old African-American female female with microcytic anemia.  The patient likely has a component of iron deficiency.  Other causes need to be evaluated.  PLAN:  The patient was seen with Dr. Earlie Server.  Prior labs in [facility] were reviewed.  We discussed with the patient that she has a component of iron deficiency but that we want to obtain additional lab work to rule out other causes.  The patient will have a CBC, ferritin, iron studies, vitamin B12 level, folate level, SPEP with immunoglobulins, and hemoglobin electrophoresis.  Hemoglobin today is 11.2 and her MCV remains low at 73.  Additional lab work is pending. Recommend for the patient to proceed with IV iron consisting of Feraheme 510 mg IV x2 doses.  Adverse effects of this medication were discussed including but not limited to an infusion reaction.  The patient is agreeable to proceeding.  She will receive her first dose of IV iron within 1 week. The patient will return in 2 months for evaluation and repeat lab work.  We will discuss her additional lab findings from today's labs at her next visit.  She was advised to call immediately if she has any concerning symptoms in the interval. The patient voices understanding of current disease status and treatment options and is in agreement with the current care plan.   All questions were answered. The patient knows to call the clinic with any problems, questions or concerns. We can certainly see the patient much sooner if necessary.   Thank you so much for allowing me to participate in the care of [facility]. I will continue to follow up the patient with you and assist in her care.   Orders Placed This Encounter  Procedures  . CBC with  Differential (Cancer Center Only)    Standing Status:   Future    Number of Occurrences:   1    Standing Expiration  Date:   06/06/2019  . Ferritin    Standing Status:   Future    Number of Occurrences:   1    Standing Expiration Date:   06/06/2019  . Iron and TIBC    Standing Status:   Future    Number of Occurrences:   1    Standing Expiration Date:   06/06/2019  . Vitamin B12    Standing Status:   Future    Number of Occurrences:   1    Standing Expiration Date:   06/05/2019  . Folate, Serum    Standing Status:   Future    Number of Occurrences:   1    Standing Expiration Date:   06/05/2019  . Multiple Myeloma Panel (SPEP&IFE w/QIG)    Standing Status:   Future    Number of Occurrences:   1    Standing Expiration Date:   06/05/2019  . Hemoglobinopathy evaluation    Standing Status:   Future    Number of Occurrences:   1    Standing Expiration Date:   06/05/2019   Mikey Bussing, DNP, AGPCNP-BC, AOCNP  ADDENDUM: Hematology/Oncology Attending: I had a face-to-face encounter with the patient today.  I recommended her care plan.  This is a very pleasant 47 years old African-American female who presented for evaluation of persistent anemia for more than 25 years.  The patient has been on oral iron tablets for many years with no significant impro her Menostar vement in her condition.  This is most likely secondary to menorrhagia as the patient denied having any other bleeding issues.  Her menstrual cycles are heavy  and including blood clots.  She denied having any gynecological abnormalities in her uterus. I had a lengthy discussion with the patient today about her condition and treatment options. I recommended for the patient to have several studies performed today including repeat CBC, iron study, ferritin, serum folate, vitamin B12 level, serum protein electrophoresis with immunofixation as well as hemoglobin electrophoresis. The patient is not responding to the oral iron tablets, I  would arrange for her to receive Feraheme infusion 510 mg IV weekly for 2 weeks starting June 07, 2018. We will see the patient back for follow-up visit in 2 months for evaluation and repeat CBC, iron study and ferritin The patient was also advised to call immediately if she has any concerning symptoms in the interval.  Disclaimer: This note was dictated with voice recognition software. Similar sounding words can inadvertently be transcribed and may be missed upon review. [name], MD 06/05/18

## 2018-06-05 ENCOUNTER — Inpatient Hospital Stay: Payer: BLUE CROSS/BLUE SHIELD

## 2018-06-05 ENCOUNTER — Telehealth: Payer: Self-pay | Admitting: Oncology

## 2018-06-05 ENCOUNTER — Encounter: Payer: Self-pay | Admitting: Oncology

## 2018-06-05 ENCOUNTER — Other Ambulatory Visit: Payer: Self-pay

## 2018-06-05 ENCOUNTER — Inpatient Hospital Stay: Payer: BLUE CROSS/BLUE SHIELD | Attending: Oncology | Admitting: Oncology

## 2018-06-05 VITALS — BP 148/83 | HR 77 | Temp 98.4°F | Resp 18 | Ht 67.0 in | Wt 141.3 lb

## 2018-06-05 DIAGNOSIS — D500 Iron deficiency anemia secondary to blood loss (chronic): Secondary | ICD-10-CM

## 2018-06-05 DIAGNOSIS — D509 Iron deficiency anemia, unspecified: Secondary | ICD-10-CM | POA: Diagnosis not present

## 2018-06-05 DIAGNOSIS — Z79899 Other long term (current) drug therapy: Secondary | ICD-10-CM | POA: Diagnosis not present

## 2018-06-05 DIAGNOSIS — D649 Anemia, unspecified: Secondary | ICD-10-CM | POA: Diagnosis not present

## 2018-06-05 LAB — CBC WITH DIFFERENTIAL (CANCER CENTER ONLY)
Basophils Absolute: 0 10*3/uL (ref 0.0–0.1)
Basophils Relative: 1 %
Eosinophils Absolute: 0 10*3/uL (ref 0.0–0.5)
Eosinophils Relative: 0 %
HCT: 35.2 % (ref 34.8–46.6)
Hemoglobin: 11.2 g/dL — ABNORMAL LOW (ref 11.6–15.9)
Lymphocytes Relative: 37 %
Lymphs Abs: 2.7 10*3/uL (ref 0.9–3.3)
MCH: 23.2 pg — AB (ref 25.1–34.0)
MCHC: 31.8 g/dL (ref 31.5–36.0)
MCV: 73 fL — ABNORMAL LOW (ref 79.5–101.0)
Monocytes Absolute: 0.3 10*3/uL (ref 0.1–0.9)
Monocytes Relative: 5 %
Neutro Abs: 4.1 10*3/uL (ref 1.5–6.5)
Neutrophils Relative %: 57 %
PLATELETS: 290 10*3/uL (ref 145–400)
RBC: 4.82 MIL/uL (ref 3.70–5.45)
RDW: 15.7 % — ABNORMAL HIGH (ref 11.2–14.5)
WBC: 7.2 10*3/uL (ref 3.9–10.3)

## 2018-06-05 LAB — IRON AND TIBC
Iron: 46 ug/dL (ref 41–142)
SATURATION RATIOS: 16 % — AB (ref 21–57)
TIBC: 286 ug/dL (ref 236–444)
UIBC: 240 ug/dL

## 2018-06-05 LAB — VITAMIN B12: Vitamin B-12: 512 pg/mL (ref 180–914)

## 2018-06-05 LAB — FOLATE: Folate: 6.6 ng/mL (ref >5.9)

## 2018-06-05 LAB — FERRITIN: Ferritin: 126 ng/mL (ref 11–307)

## 2018-06-05 NOTE — Telephone Encounter (Signed)
Gave patient avs and calendar of upcoming appts.  °

## 2018-06-05 NOTE — Patient Instructions (Signed)

## 2018-06-06 ENCOUNTER — Other Ambulatory Visit: Payer: Self-pay | Admitting: Oncology

## 2018-06-06 DIAGNOSIS — D500 Iron deficiency anemia secondary to blood loss (chronic): Secondary | ICD-10-CM

## 2018-06-06 LAB — HEMOGLOBINOPATHY EVALUATION
HGB A2 QUANT: 5.3 % — AB (ref 1.8–3.2)
HGB S QUANTITAION: 0 %
Hgb A: 91.3 % — ABNORMAL LOW (ref 96.4–98.8)
Hgb C: 0 %
Hgb F Quant: 3.4 % — ABNORMAL HIGH (ref 0.0–2.0)
Hgb Variant: 0 %

## 2018-06-07 LAB — MULTIPLE MYELOMA PANEL, SERUM
Albumin SerPl Elph-Mcnc: 4.1 g/dL (ref 2.9–4.4)
Albumin/Glob SerPl: 1 (ref 0.7–1.7)
Alpha 1: 0.3 g/dL (ref 0.0–0.4)
Alpha2 Glob SerPl Elph-Mcnc: 0.8 g/dL (ref 0.4–1.0)
B-GLOBULIN SERPL ELPH-MCNC: 1.1 g/dL (ref 0.7–1.3)
GAMMA GLOB SERPL ELPH-MCNC: 2 g/dL — AB (ref 0.4–1.8)
Globulin, Total: 4.2 g/dL — ABNORMAL HIGH (ref 2.2–3.9)
IGA: 329 mg/dL (ref 87–352)
IgG (Immunoglobin G), Serum: 1797 mg/dL — ABNORMAL HIGH (ref 700–1600)
IgM (Immunoglobulin M), Srm: 231 mg/dL — ABNORMAL HIGH (ref 26–217)
M PROTEIN SERPL ELPH-MCNC: 0.8 g/dL — AB
TOTAL PROTEIN ELP: 8.3 g/dL (ref 6.0–8.5)

## 2018-06-10 ENCOUNTER — Inpatient Hospital Stay: Payer: BLUE CROSS/BLUE SHIELD

## 2018-06-10 VITALS — BP 116/84 | HR 75 | Temp 98.9°F | Resp 18

## 2018-06-10 DIAGNOSIS — D649 Anemia, unspecified: Secondary | ICD-10-CM | POA: Diagnosis not present

## 2018-06-10 DIAGNOSIS — D500 Iron deficiency anemia secondary to blood loss (chronic): Secondary | ICD-10-CM

## 2018-06-10 MED ORDER — FERUMOXYTOL INJECTION 510 MG/17 ML
510.0000 mg | Freq: Once | INTRAVENOUS | Status: AC
  Administered 2018-06-10: 510 mg via INTRAVENOUS
  Filled 2018-06-10: qty 17

## 2018-06-10 MED ORDER — SODIUM CHLORIDE 0.9 % IV SOLN
Freq: Once | INTRAVENOUS | Status: AC
  Administered 2018-06-10: 09:00:00 via INTRAVENOUS
  Filled 2018-06-10: qty 250

## 2018-06-10 NOTE — Patient Instructions (Signed)

## 2018-06-17 ENCOUNTER — Inpatient Hospital Stay: Payer: BLUE CROSS/BLUE SHIELD | Attending: Internal Medicine

## 2018-06-17 VITALS — BP 122/78 | HR 70 | Temp 99.2°F | Resp 18

## 2018-06-17 DIAGNOSIS — D649 Anemia, unspecified: Secondary | ICD-10-CM | POA: Diagnosis not present

## 2018-06-17 DIAGNOSIS — Z79899 Other long term (current) drug therapy: Secondary | ICD-10-CM | POA: Insufficient documentation

## 2018-06-17 DIAGNOSIS — D500 Iron deficiency anemia secondary to blood loss (chronic): Secondary | ICD-10-CM

## 2018-06-17 MED ORDER — SODIUM CHLORIDE 0.9 % IV SOLN
510.0000 mg | Freq: Once | INTRAVENOUS | Status: AC
  Administered 2018-06-17: 510 mg via INTRAVENOUS
  Filled 2018-06-17: qty 17

## 2018-06-17 MED ORDER — SODIUM CHLORIDE 0.9 % IV SOLN
Freq: Once | INTRAVENOUS | Status: AC
  Administered 2018-06-17: 10:00:00 via INTRAVENOUS
  Filled 2018-06-17: qty 250

## 2018-06-17 NOTE — Patient Instructions (Signed)

## 2018-08-12 ENCOUNTER — Encounter: Payer: Self-pay | Admitting: Internal Medicine

## 2018-08-12 ENCOUNTER — Telehealth: Payer: Self-pay | Admitting: Internal Medicine

## 2018-08-12 ENCOUNTER — Inpatient Hospital Stay: Payer: BLUE CROSS/BLUE SHIELD | Attending: Internal Medicine

## 2018-08-12 ENCOUNTER — Inpatient Hospital Stay: Payer: BLUE CROSS/BLUE SHIELD

## 2018-08-12 ENCOUNTER — Inpatient Hospital Stay (HOSPITAL_BASED_OUTPATIENT_CLINIC_OR_DEPARTMENT_OTHER): Payer: BLUE CROSS/BLUE SHIELD | Admitting: Internal Medicine

## 2018-08-12 VITALS — BP 150/83 | HR 66 | Temp 99.1°F | Resp 17 | Ht 67.0 in | Wt 140.9 lb

## 2018-08-12 DIAGNOSIS — D563 Thalassemia minor: Secondary | ICD-10-CM | POA: Diagnosis not present

## 2018-08-12 DIAGNOSIS — N920 Excessive and frequent menstruation with regular cycle: Secondary | ICD-10-CM

## 2018-08-12 DIAGNOSIS — D500 Iron deficiency anemia secondary to blood loss (chronic): Secondary | ICD-10-CM | POA: Insufficient documentation

## 2018-08-12 DIAGNOSIS — D472 Monoclonal gammopathy: Secondary | ICD-10-CM

## 2018-08-12 DIAGNOSIS — F411 Generalized anxiety disorder: Secondary | ICD-10-CM

## 2018-08-12 LAB — LACTATE DEHYDROGENASE: LDH: 137 U/L (ref 98–192)

## 2018-08-12 LAB — CMP (CANCER CENTER ONLY)
ALBUMIN: 4.3 g/dL (ref 3.5–5.0)
ALK PHOS: 49 U/L (ref 38–126)
ALT: 14 U/L (ref 0–44)
AST: 15 U/L (ref 15–41)
Anion gap: 9 (ref 5–15)
BILIRUBIN TOTAL: 0.4 mg/dL (ref 0.3–1.2)
BUN: 13 mg/dL (ref 6–20)
CALCIUM: 10.2 mg/dL (ref 8.9–10.3)
CO2: 26 mmol/L (ref 22–32)
CREATININE: 1.03 mg/dL — AB (ref 0.44–1.00)
Chloride: 107 mmol/L (ref 98–111)
GFR, Est AFR Am: >60 mL/min (ref >60)
GFR, Estimated: >60 mL/min (ref >60)
GLUCOSE: 90 mg/dL (ref 70–99)
Potassium: 4.5 mmol/L (ref 3.5–5.1)
SODIUM: 142 mmol/L (ref 135–145)
TOTAL PROTEIN: 8.7 g/dL — AB (ref 6.5–8.1)

## 2018-08-12 LAB — IRON AND TIBC
Iron: 100 ug/dL (ref 41–142)
SATURATION RATIOS: 42 % (ref 21–57)
TIBC: 238 ug/dL (ref 236–444)
UIBC: 137 ug/dL

## 2018-08-12 LAB — CBC WITH DIFFERENTIAL (CANCER CENTER ONLY)
Basophils Absolute: 0.1 10*3/uL (ref 0.0–0.1)
Basophils Relative: 1 %
EOS ABS: 0 10*3/uL (ref 0.0–0.5)
EOS PCT: 0 %
HCT: 35.7 % (ref 34.8–46.6)
Hemoglobin: 11.5 g/dL — ABNORMAL LOW (ref 11.6–15.9)
LYMPHS ABS: 2 10*3/uL (ref 0.9–3.3)
LYMPHS PCT: 32 %
MCH: 23.2 pg — AB (ref 25.1–34.0)
MCHC: 32.3 g/dL (ref 31.5–36.0)
MCV: 71.9 fL — AB (ref 79.5–101.0)
MONO ABS: 0.5 10*3/uL (ref 0.1–0.9)
Monocytes Relative: 8 %
Neutro Abs: 3.6 10*3/uL (ref 1.5–6.5)
Neutrophils Relative %: 59 %
PLATELETS: 252 10*3/uL (ref 145–400)
RBC: 4.96 MIL/uL (ref 3.70–5.45)
RDW: 16.3 % — AB (ref 11.2–14.5)
WBC: 6.1 10*3/uL (ref 3.9–10.3)

## 2018-08-12 LAB — FERRITIN: Ferritin: 509 ng/mL — ABNORMAL HIGH (ref 11–307)

## 2018-08-12 NOTE — Progress Notes (Signed)
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  PRIOR THERAPY: Feraheme infusion x2 doses last dose was giving June 14, 2018  CURRENT THERAPY: None  INTERVAL HISTORY: Jessica [name] 47 y.o. female returns to the clinic today for follow-up visit accompanied by her husband.  The patient is feeling fine today with no specific complaints except for mild fatigue.  Her fatigue improved slightly after the Feraheme infusion.  The patient had several studies performed after her initial visit including hemoglobin electrophoresis that showed finding consistent with beta thalassemia minor.  She also had serum protein electrophoresis that showed M spike measuring 0.8G/DL with elevated IgG level.  The patient denied having any current chest pain, shortness of breath, cough or hemoptysis.  She denied having any fever or chills.  She has no nausea, vomiting, diarrhea or constipation.  She is here today for evaluation and repeat CBC.  MEDICAL HISTORY: Past Medical History:  Diagnosis Date  . Allergy   . Anemia   . Anxiety   . Depression   . Hyperlipidemia     ALLERGIES:  is allergic to sulfonamide derivatives.  MEDICATIONS:  Current Outpatient Medications  Medication Sig Dispense Refill  . linaclotide (LINZESS) 145 MCG CAPS capsule Take 1 capsule (145 mcg total) by mouth daily before breakfast. 30 capsule 3  . pravastatin (PRAVACHOL) 40 MG tablet Take 1 tablet (40 mg total) by mouth daily. 90 tablet 3  . traZODone (DESYREL) 50 MG tablet TAKE 0.5-1 TABLETS (25-50 MG TOTAL) BY MOUTH AT BEDTIME AS NEEDED FOR SLEEP. 90 tablet 2  . venlafaxine (EFFEXOR) 25 MG tablet TAKE 1 TABLET (25 MG TOTAL) BY MOUTH 2 (TWO) TIMES DAILY WITH A MEAL. 180  tablet 1   No current facility-administered medications for this visit.     SURGICAL HISTORY:  Past Surgical History:  Procedure Laterality Date  . TUBAL LIGATION      REVIEW OF SYSTEMS:  Constitutional: positive for fatigue Eyes: negative Ears, nose, mouth, throat, and face: negative Respiratory: negative Cardiovascular: negative Gastrointestinal: negative Genitourinary:negative Integument/breast: negative Hematologic/lymphatic: negative Musculoskeletal:negative Neurological: negative Behavioral/Psych: negative Endocrine: negative Allergic/Immunologic: negative   PHYSICAL EXAMINATION: General appearance: alert, cooperative, fatigued and no distress Head: Normocephalic, without obvious abnormality, atraumatic Neck: no adenopathy, no JVD, supple, symmetrical, trachea midline and thyroid not enlarged, symmetric, no tenderness/mass/nodules Lymph nodes: Cervical, supraclavicular, and axillary nodes normal. Resp: clear to auscultation bilaterally Back: symmetric, no curvature. ROM normal. No CVA tenderness. Cardio: regular rate and rhythm, S1, S2 normal, no murmur, click, rub or gallop GI: soft, non-tender; bowel sounds normal; no masses,  no organomegaly Extremities: extremities normal, atraumatic, no cyanosis or edema Neurologic: Alert and oriented X 3, normal strength and tone. Normal symmetric reflexes. Normal coordination and gait  ECOG PERFORMANCE STATUS: 1 - Symptomatic but completely ambulatory  Blood pressure (!) 150/83, pulse 66, temperature 99.1 F (37.3 C), temperature source Oral, resp. rate 17, height '5\' 7"'  (1.702 m), weight 140 lb 14.4 oz (63.9 kg), SpO2 100 %.  LABORATORY DATA: Lab Results  Component Value Date   WBC 6.1 08/12/2018   HGB 11.5 (L) 08/12/2018   HCT 35.7 08/12/2018   MCV 71.9 (L) 08/12/2018   PLT 252 08/12/2018      Chemistry      Component  Value Date/Time   NA 141 04/11/2018 1752   K 4.8 04/11/2018 1752   CL 104 04/11/2018 1752   CO2  20 04/11/2018 1752   BUN 11 04/11/2018 1752   CREATININE 0.96 04/11/2018 1752   CREATININE 0.98 09/10/2017 1333      Component Value Date/Time   CALCIUM 10.1 04/11/2018 1752   ALKPHOS 52 04/11/2018 1752   AST 16 04/11/2018 1752   ALT 13 04/11/2018 1752   BILITOT 0.3 04/11/2018 1752       RADIOGRAPHIC STUDIES: No results found.  ASSESSMENT AND PLAN: This is a very pleasant 47 years old African-American female presented for evaluation of microcytic hypochromic anemia likely secondary to beta thalassemia minor as well as iron deficiency.  The patient received Feraheme infusion and felt a little bit better but she will continue to have low hemoglobin and hematocrit because of the beta thalassemia minor. Her previous blood work also showed concerning findings for monoclonal gammopathy.  I had a lengthy discussion with the patient and her husband about her condition.  I recommended for the patient to have complete myeloma panel performed today. I will arrange for the patient to come back for follow-up visit in 3 months with repeat CBC, iron study and ferritin. If the myeloma panel showed concerning findings, I may consider The patient for a bone marrow biopsy and aspirate to rule out multiple myeloma. She was advised to call immediately if she has any concerning symptoms in the interval. The patient voices understanding of current disease status and treatment options and is in agreement with the current care plan.  All questions were answered. The patient knows to call the clinic with any problems, questions or concerns. We can certainly see the patient much sooner if necessary.  I spent 15 minutes counseling the patient face to face. The total time spent in the appointment was 25 minutes.  Disclaimer: This note was dictated with voice recognition software. Similar sounding words can inadvertently be transcribed and may not be corrected upon review.

## 2018-08-12 NOTE — Telephone Encounter (Signed)
Gave pt avs and calendar  °

## 2018-08-12 NOTE — Patient Instructions (Signed)
Monoclonal Gammopathy of Undetermined Significance (MGUS) Monoclonal gammopathy of undetermined significance (MGUS) is a condition in which there is too much of a protein called monoclonal protein, or M protein, in the blood. MGUS can cause you to have too many cells in your blood and not enough space for healthy cells. This condition may increase your risk of developing multiple myeloma or other blood disorders in the future. What are the causes? The cause of this condition is not known. What increases the risk? You are more likely to develop this condition if:  You are African American.  You are age 62 or older.  You are female.  You have an autoimmune disease.  You have been exposed to radiation.  You have a family history of MGUS.  What are the signs or symptoms? There are no symptoms of this condition. How is this diagnosed? This condition may be diagnosed with a blood test that checks for M protein. How is this treated? Treatment for this condition may involve:  Having regular exams. This will allow your health care provider to monitor your health.  Having tests done regularly, such as: ? Blood tests to check for M protein in your body. ? Imaging tests, such as a CT scan. ? A bone marrow biopsy. This test involves taking a sample of bone marrow from your body so it can be looked at under a microscope.  Follow these instructions at home:  Keep all follow-up visits as told by your health care provider. This is important. Contact a health care provider if:  You have trouble swallowing.  You have pain in your back or ribs.  You have a fever.  You are bruising easily. Get help right away if:  You break a bone.  You have trouble breathing. Summary  Monoclonal gammopathy of undetermined significance (MGUS) is a condition in which there is too much of a protein called monoclonal protein, or M protein, in the blood.  This condition may be diagnosed with a blood test  that checks for M protein.  Treatment for this condition may involve having tests done regularly. Tests may include blood tests, imaging tests, and a bone marrow biopsy. This information is not intended to replace advice given to you by your health care provider. Make sure you discuss any questions you have with your health care provider. Document Released: 09/20/2016 Document Revised: 09/20/2016 Document Reviewed: 09/20/2016 Elsevier Interactive Patient Education  2018 [employer]. Hemolytic Anemia Anemia is a condition in which you do not have enough red blood cells to carry oxygen throughout your body. Hemolytic anemia occurs when your red blood cells are being destroyed faster than they are being produced. Hemolytic anemia can affect people of all ages. It may worsen existing heart or lung disease. There are many types of hemolytic anemia, and they can be divided into two different groups: inherited and acquired. Inherited hemolytic anemia is due to a gene that your parents passed on to you. The abnormal cells may break down while moving through your circulatory system. Your spleen may remove the abnormal blood cells and debris from your blood stream. Acquired hemolytic anemia occurs when your red blood cells are destroyed either by certain medicines that you have used or as a result of infections or diseases that you have. What are the causes? Hemolytic anemia is caused by red blood cell destruction. Sometimes the reasons for the destruction are not clear. Known causes include:  Inherited disorders, such as sickle cell anemia and thalassemias.  Use of certain medicines.  Blood infection (septicemia).  Exposure to toxic chemicals or excessive radiation.  Reactions to blood transfusions.  Certain immune disorders.  Artificial heart valves.  Enlarged spleens.  What are the signs or symptoms?  Pale skin, eyes, and fingernails.  Irregular or fast  heartbeat.  Headaches.  Tiredness (fatigue) and weakness.  Dizziness or fainting.  Shortness of breath.  Yellowing of the skin or eyes (jaundice).  Chest pain.  Cold hands and feet. How is this diagnosed? Your health care provider will do a physical exam and ask questions about your symptoms. Blood tests, urine tests, and taking bone marrow tissue (biopsies) may be done to help find the cause of your anemia. How is this treated? Treatment depends on the cause of your anemia. Treatment may include:  Medicines.  Blood transfusions.  Plasmapheresis.  Blood and bone marrow stem cell transplant.  Surgery to remove the spleen.  Follow these instructions at home:  Only take over-the-counter or prescription medicines as directed by your health care provider. If you are given antibiotics, take them as directed. Finish them even if you start to feel better.  Take over-the-counter iron supplements as directed by your health care provider.  Decrease the chances of getting sick by: ? Washing your hands often. ? Staying away from people who are sick. ? Getting a flu shot and pneumonia shot if recommended by your health care provider.  Avoid certain kinds of foods that can expose you to bacteria, such as uncooked foods.  Keep all follow-up appointments with your health care provider. Contact a health care provider if:  You become dizzy or tired easily.  Your skin looks pale.  You feel your heart beating faster than normal.  You feel like your heart has skipped or stopped beats (irregular heartbeat). Get help right away if:  Your skin and eyes turn yellow.  You develop chest pain.  You become short of breath.  You faint.  You develop an uncontrolled cough. This information is not intended to replace advice given to you by your health care provider. Make sure you discuss any questions you have with your health care provider. Document Released: 10/30/2005 Document  Revised: 04/06/2016 Document Reviewed: 03/19/2013 Elsevier Interactive Patient Education  2017 [employer].

## 2018-08-13 LAB — KAPPA/LAMBDA LIGHT CHAINS
Kappa free light chain: 54.5 mg/L — ABNORMAL HIGH (ref 3.3–19.4)
Kappa, lambda light chain ratio: 4.16 — ABNORMAL HIGH (ref 0.26–1.65)
Lambda free light chains: 13.1 mg/L (ref 5.7–26.3)

## 2018-08-13 LAB — BETA 2 MICROGLOBULIN, SERUM: BETA 2 MICROGLOBULIN: 1.3 mg/L (ref 0.6–2.4)

## 2018-08-13 LAB — IGG, IGA, IGM
IGG (IMMUNOGLOBIN G), SERUM: 1598 mg/dL (ref 700–1600)
IGM (IMMUNOGLOBULIN M), SRM: 242 mg/dL — AB (ref 26–217)
IgA: 321 mg/dL (ref 87–352)

## 2018-08-16 DIAGNOSIS — R0789 Other chest pain: Secondary | ICD-10-CM | POA: Diagnosis not present

## 2018-08-16 DIAGNOSIS — I10 Essential (primary) hypertension: Secondary | ICD-10-CM | POA: Diagnosis not present

## 2018-08-16 DIAGNOSIS — R079 Chest pain, unspecified: Secondary | ICD-10-CM | POA: Diagnosis not present

## 2018-08-16 DIAGNOSIS — R457 State of emotional shock and stress, unspecified: Secondary | ICD-10-CM | POA: Diagnosis not present

## 2018-08-25 ENCOUNTER — Other Ambulatory Visit: Payer: Self-pay | Admitting: Family Medicine

## 2018-09-04 ENCOUNTER — Ambulatory Visit: Payer: BLUE CROSS/BLUE SHIELD | Admitting: Physician Assistant

## 2018-09-04 ENCOUNTER — Other Ambulatory Visit: Payer: Self-pay

## 2018-09-04 ENCOUNTER — Encounter: Payer: Self-pay | Admitting: Physician Assistant

## 2018-09-04 ENCOUNTER — Encounter

## 2018-09-04 VITALS — BP 136/66 | HR 87 | Temp 98.3°F | Resp 18 | Ht 67.0 in | Wt 142.8 lb

## 2018-09-04 DIAGNOSIS — R030 Elevated blood-pressure reading, without diagnosis of hypertension: Secondary | ICD-10-CM

## 2018-09-04 DIAGNOSIS — F411 Generalized anxiety disorder: Secondary | ICD-10-CM

## 2018-09-04 DIAGNOSIS — E559 Vitamin D deficiency, unspecified: Secondary | ICD-10-CM

## 2018-09-04 DIAGNOSIS — G44219 Episodic tension-type headache, not intractable: Secondary | ICD-10-CM

## 2018-09-04 MED ORDER — HYDROXYZINE HCL 25 MG PO TABS: 12.5000 mg | ORAL_TABLET | Freq: Every day | ORAL | 0 refills | Status: DC | PRN

## 2018-09-04 MED ORDER — VENLAFAXINE HCL ER 37.5 MG PO CP24: 37.5000 mg | ORAL_CAPSULE | Freq: Every day | ORAL | 0 refills | Status: DC

## 2018-09-04 NOTE — Progress Notes (Signed)
Jessica [name]  MRN: [id number] DOB: 1971-09-30  Subjective:  Jessica [name] is a 47 y.o. female seen in office today for a chief complaint of headache.  Onset: intermittent 2 weeks, not present today   Location: "whole head"  Quality: nagging feeling "nothing I cant live with" Frequency: throughout the day  Precipitating factors: not identifiable    Prior treatment: OTC tylenol and ibuprofen, which relieve the symptoms.    Associated Symptoms Nausea/vomiting: no  Photophobia/phonophobia: no  Tearing of eyes: no  Sinus pain/pressure: no  Family hx migraine: no  Personal stressors: no, takes effexor and trazodone, helps with overall anxiety, having panic attacks 1-2 x a week at this time, has learned breathing techniques. Not currently seeing therapist but used to.  Has actually decreased her Effexor from 25 mg twice daily to 25 mg once daily because she is wondering if she needed that much.  She is not taking anything for panic attacks. Does not want anything that could be addicting.  Relation to menstrual cycle: no   Red Flags Fever: no  Neck pain/stiffness: no  Vision/speech/swallow/hearing difficulty: no  Focal weakness/numbness: no  Altered mental status: no  Trauma: no  New type of headache: no  Anticoagulant use: no  H/o cancer/HIV/Pregnancy: no   Elevated bp readings at home: 149/83. Was just evaluated by cardiology for heart palpations ~4 months ago. Had echo stress test, which was normal. Had panic attack 2 weeks ago, called EMS. They put her on a heart monitor and was NSR. BP was elevated at 115B systolicaly. It went down to 262 systollically. No structured exercise. Eats junk food all the time, loves salty foods. Denies smoking. Denies illicit drug use and tobacco use. No caffeine use.Has PMH of iron deficiency, followed by oncology. Being worked up for MGUS due to elevated protein. FH of HTN in mother and MGM.   Review of Systems Per HPI  Patient Active Problem  List   Diagnosis Date Noted  . Beta thalassemia minor 08/12/2018  . Obsessive compulsive disorder 02/18/2014  . Major depressive disorder, recurrent episode, moderate ([place]) 05/14/2013  . Major depressive disorder, recurrent episode, severe, without mention of psychotic behavior 03/05/2013  . Major depressive disorder, recurrent episode, in full remission ([place]) 12/04/2012  . Social anxiety disorder 09/17/2012  . ANXIETY DISORDER, GENERALIZED 07/15/2008  . HYPERCHOLESTEROLEMIA 10/07/2007  . VITILIGO 10/07/2007  . ANEMIA, IRON DEFICIENCY, UNSPEC. 01/10/2007    Current Outpatient Medications on File Prior to Visit  Medication Sig Dispense Refill  . pravastatin (PRAVACHOL) 40 MG tablet TAKE 1 TABLET BY MOUTH EVERY DAY 90 tablet 2  . traZODone (DESYREL) 50 MG tablet TAKE 0.5-1 TABLETS (25-50 MG TOTAL) BY MOUTH AT BEDTIME AS NEEDED FOR SLEEP. 90 tablet 2  . linaclotide (LINZESS) 145 MCG CAPS capsule Take 1 capsule (145 mcg total) by mouth daily before breakfast. (Patient not taking: Reported on 09/04/2018) 30 capsule 3   No current facility-administered medications on file prior to visit.     Allergies  Allergen Reactions  . Sulfonamide Derivatives     REACTION: hives     Objective:  BP 136/66   Pulse 87   Temp 98.3 F (36.8 C) (Oral)   Resp 18   Ht 5\' 7"  (1.702 m)   Wt 142 lb 12.8 oz (64.8 kg)   LMP 08/07/2018   SpO2 98%   BMI 22.37 kg/m   Physical Exam  Constitutional: She is oriented to person, place, and time. She appears well-developed and well-nourished. She  does not appear ill. No distress.  HENT:  Head: Normocephalic and atraumatic.  Mouth/Throat: Uvula is midline, oropharynx is clear and moist and mucous membranes are normal. No tonsillar exudate.  No pain with palpation of bilateral temporal regions.   Eyes: Pupils are equal, round, and reactive to light. Conjunctivae and EOM are normal.  Fundoscopic exam:      The right eye shows no hemorrhage and no papilledema.  The right eye shows red reflex.       The left eye shows no hemorrhage and no papilledema. The left eye shows red reflex.  Neck: Normal range of motion and full passive range of motion without pain. No Brudzinski's sign and no Kernig's sign noted.  Cardiovascular: Normal rate, regular rhythm, normal heart sounds and intact distal pulses.  Pulmonary/Chest: Effort normal and breath sounds normal. She has no wheezes. She has no rhonchi. She has no rales.  Musculoskeletal:       Right lower leg: She exhibits no swelling.       Left lower leg: She exhibits no swelling.  Neurological: She is alert and oriented to person, place, and time. She has normal strength and normal reflexes. No cranial nerve deficit or sensory deficit. She displays a negative Romberg sign. Gait normal.  Normal FNF and HTS test.  Normal Tandem walk.   Skin: Skin is warm and dry.  Psychiatric: She has a normal mood and affect.  Vitals reviewed.    BP Readings from Last 3 Encounters:  09/04/18 136/66  08/12/18 (!) 150/83  06/17/18 122/78   GAD 7 : Generalized Anxiety Score 04/11/2018  Nervous, Anxious, on Edge 2  Control/stop worrying 3  Worry too much - different things 3  Trouble relaxing 2  Restless 0  Easily annoyed or irritable 1  Afraid - awful might happen 2  Total GAD 7 Score 13  Anxiety Difficulty Somewhat difficult     Assessment and Plan :  1. ANXIETY DISORDER, GENERALIZED Suspect uncontrolled anxiety is playing a role in patient's symptoms.  It appears she is having panic attacks 1-2 times a week, leading to increased blood pressure.  Recommend discontinuing Effexor immediate release twice daily and start Effexor extended release once daily.  Also suggested using hydroxyzine as needed for panic attacks.  She would likely benefit from a as needed benzo, but does not want anything with habit-forming properties at this time.  Labs pending.  Plan to follow-up in 2  weeks for reevaluation. - TSH - Vitamin  B12 - venlafaxine XR (EFFEXOR-XR) 37.5 MG 24 hr capsule; Take 1 capsule (37.5 mg total) by mouth daily with breakfast.  Dispense: 90 capsule; Refill: 0 - hydrOXYzine (ATARAX/VISTARIL) 25 MG tablet; Take 0.5-1 tablets (12.5-25 mg total) by mouth daily as needed for anxiety.  Dispense: 30 tablet; Refill: 0  2. Vitamin D deficiency - VITAMIN D 25 Hydroxy (Vit-D Deficiency, Fractures)  3. Elevated blood pressure reading Initially elevated in office but repeat value within normal range.  Suggest dietary changes as patient does like salty foods.  Given education material on DASH diet.  Also given education material on how to check blood pressure and a blood pressure log.  Recommend she check her blood pressure outside the office a few times each week and document these values.  Follow-up in 2 weeks with reported blood pressure values to determine further management.  She is currently asymptomatic.  Given strict ED precautions. 4. Episodic tension-type headache, not intractable Asymptomatic today.  Discussed that there are likely  multiple etiologies for episodic tension type headaches.  Untreated blood pressure may be contributing, however not convinced that she has hypertension at this time.  Will reevaluate in 2 weeks with home BP readings.  When she experiences the pain it is only mild, is relieved with tylenol and NSAIDs. No focal neuro deficits, nuchal rigidity, or change in vision. Given strict return/ED precautions. Pt voices his understanding.   Side effects, risks, benefits, and alternatives of the medications and treatment plan prescribed today were discussed, and patient expressed understanding of the instructions given. No barriers to understanding were identified. Red flags discussed in detail. Pt expressed understanding regarding what to do in case of emergency/urgent symptoms.  Note - This record has been created using [employer].  Chart creation errors have been sought, but may not  always  have been located. Such creation errors do not reflect on  the standard of medical care.    Tenna Delaine PA-C  [facility] 09/08/2018 2:23 PM

## 2018-09-04 NOTE — Patient Instructions (Addendum)
In terms of elevated blood pressure, I would like you to start decreasing the amount of salt in your diet and start walking at least 10 minutes daily. I would like you to check your blood pressure at least a couple times over the next 2 weeks outside of the office and document these values. It is best if you check the blood pressure at different times in the day. Your goal is <140/90. Please follow up in office in 2 weeks. Bring your blood pressure readings and your blood pressure cuff to that visit.   If you start to have chest pain, blurred vision, shortness of breath, severe headache, lower leg swelling, or nausea/vomiting please seek care immediately here or at the ED.   For anxiety stop the effexor 25mg . Start the effexor XR 37.5mg  daily. Use hydroxyzine for moments of increased panic. This can make you drowsy so be aware.   Thank you for letting me participate in your health and well being.     If you have lab work done today you will be contacted with your lab results within the next 2 weeks.  If you have not heard from [place] then please contact us. The fastest way to get your results is to register for My Chart.  DASH Eating Plan DASH stands for "Dietary Approaches to Stop Hypertension." The DASH eating plan is a healthy eating plan that has been shown to reduce high blood pressure (hypertension). It may also reduce your risk for type 2 diabetes, heart disease, and stroke. The DASH eating plan may also help with weight loss. What are tips for following this plan? General guidelines  Avoid eating more than 2,300 mg (milligrams) of salt (sodium) a day. If you have hypertension, you may need to reduce your sodium intake to 1,500 mg a day.  Limit alcohol intake to no more than 1 drink a day for nonpregnant women and 2 drinks a day for men. One drink equals 12 oz of beer, 5 oz of wine, or 1 oz of hard liquor.  Work with your health care provider to maintain a healthy body weight or to lose  weight. Ask what an ideal weight is for you.  Get at least 30 minutes of exercise that causes your heart to beat faster (aerobic exercise) most days of the week. Activities may include walking, swimming, or biking.  Work with your health care provider or diet and nutrition specialist (dietitian) to adjust your eating plan to your individual calorie needs. Reading food labels  Check food labels for the amount of sodium per serving. Choose foods with less than 5 percent of the Daily Value of sodium. Generally, foods with less than 300 mg of sodium per serving fit into this eating plan.  To find whole grains, look for the word "whole" as the first word in the ingredient list. Shopping  Buy products labeled as "low-sodium" or "no salt added."  Buy fresh foods. Avoid canned foods and premade or frozen meals. Cooking  Avoid adding salt when cooking. Use salt-free seasonings or herbs instead of table salt or sea salt. Check with your health care provider or pharmacist before using salt substitutes.  Do not fry foods. Cook foods using healthy methods such as baking, boiling, grilling, and broiling instead.  Cook with heart-healthy oils, such as olive, canola, soybean, or sunflower oil. Meal planning   Eat a balanced diet that includes: ? 5 or more servings of fruits and vegetables each day. At each meal, try to  fill half of your plate with fruits and vegetables. ? Up to 6-8 servings of whole grains each day. ? Less than 6 oz of lean meat, poultry, or fish each day. A 3-oz serving of meat is about the same size as a deck of cards. One egg equals 1 oz. ? 2 servings of low-fat dairy each day. ? A serving of nuts, seeds, or beans 5 times each week. ? Heart-healthy fats. Healthy fats called Omega-3 fatty acids are found in foods such as flaxseeds and coldwater fish, like sardines, salmon, and mackerel.  Limit how much you eat of the following: ? Canned or prepackaged foods. ? Food that is high  in trans fat, such as fried foods. ? Food that is high in saturated fat, such as fatty meat. ? Sweets, desserts, sugary drinks, and other foods with added sugar. ? Full-fat dairy products.  Do not salt foods before eating.  Try to eat at least 2 vegetarian meals each week.  Eat more home-cooked food and less restaurant, buffet, and fast food.  When eating at a restaurant, ask that your food be prepared with less salt or no salt, if possible. What foods are recommended? The items listed may not be a complete list. Talk with your dietitian about what dietary choices are best for you. Grains Whole-grain or whole-wheat bread. Whole-grain or whole-wheat pasta. Brown rice. [place]. Bulgur. Whole-grain and low-sodium cereals. Pita bread. Low-fat, low-sodium crackers. Whole-wheat flour tortillas. Vegetables Fresh or frozen vegetables (raw, steamed, roasted, or grilled). Low-sodium or reduced-sodium tomato and vegetable juice. Low-sodium or reduced-sodium tomato sauce and tomato paste. Low-sodium or reduced-sodium canned vegetables. Fruits All fresh, dried, or frozen fruit. Canned fruit in natural juice (without added sugar). Meat and other protein foods Skinless chicken or [place]. Ground chicken or [place]. Pork with fat trimmed off. Fish and seafood. Egg whites. Dried beans, peas, or lentils. Unsalted nuts, nut butters, and seeds. Unsalted canned beans. Lean cuts of beef with fat trimmed off. Low-sodium, lean deli meat. Dairy Low-fat (1%) or fat-free (skim) milk. Fat-free, low-fat, or reduced-fat cheeses. Nonfat, low-sodium ricotta or cottage cheese. Low-fat or nonfat yogurt. Low-fat, low-sodium cheese. Fats and oils Soft margarine without trans fats. Vegetable oil. Low-fat, reduced-fat, or light mayonnaise and salad dressings (reduced-sodium). Canola, safflower, olive, soybean, and sunflower oils. Avocado. Seasoning and other foods Herbs. Spices. Seasoning mixes without salt. Unsalted  popcorn and pretzels. Fat-free sweets. What foods are not recommended? The items listed may not be a complete list. Talk with your dietitian about what dietary choices are best for you. Grains Baked goods made with fat, such as croissants, muffins, or some breads. Dry pasta or rice meal packs. Vegetables Creamed or fried vegetables. Vegetables in a cheese sauce. Regular canned vegetables (not low-sodium or reduced-sodium). Regular canned tomato sauce and paste (not low-sodium or reduced-sodium). Regular tomato and vegetable juice (not low-sodium or reduced-sodium). Angie Fava. Olives. Fruits Canned fruit in a light or heavy syrup. Fried fruit. Fruit in cream or butter sauce. Meat and other protein foods Fatty cuts of meat. Ribs. Fried meat. Berniece Salines. Sausage. Bologna and other processed lunch meats. Salami. Fatback. Hotdogs. Bratwurst. Salted nuts and seeds. Canned beans with added salt. Canned or smoked fish. Whole eggs or egg yolks. Chicken or [place] with skin. Dairy Whole or 2% milk, cream, and half-and-half. Whole or full-fat cream cheese. Whole-fat or sweetened yogurt. Full-fat cheese. Nondairy creamers. Whipped toppings. Processed cheese and cheese spreads. Fats and oils Butter. Stick margarine. Lard. Shortening. Ghee. Bacon fat.  Tropical oils, such as coconut, palm kernel, or palm oil. Seasoning and other foods Salted popcorn and pretzels. Onion salt, garlic salt, seasoned salt, table salt, and sea salt. Worcestershire sauce. Tartar sauce. Barbecue sauce. Teriyaki sauce. Soy sauce, including reduced-sodium. Steak sauce. Canned and packaged gravies. Fish sauce. Oyster sauce. Cocktail sauce. Horseradish that you find on the shelf. Ketchup. Mustard. Meat flavorings and tenderizers. Bouillon cubes. Hot sauce and Tabasco sauce. Premade or packaged marinades. Premade or packaged taco seasonings. Relishes. Regular salad dressings. Where to find more information:  National Heart, Lung, and [place]: https://wilson-eaton.com/  American Heart Association: www.heart.org Summary  The DASH eating plan is a healthy eating plan that has been shown to reduce high blood pressure (hypertension). It may also reduce your risk for type 2 diabetes, heart disease, and stroke.  With the DASH eating plan, you should limit salt (sodium) intake to 2,300 mg a day. If you have hypertension, you may need to reduce your sodium intake to 1,500 mg a day.  When on the DASH eating plan, aim to eat more fresh fruits and vegetables, whole grains, lean proteins, low-fat dairy, and heart-healthy fats.  Work with your health care provider or diet and nutrition specialist (dietitian) to adjust your eating plan to your individual calorie needs. This information is not intended to replace advice given to you by your health care provider. Make sure you discuss any questions you have with your health care provider. Document Released: 10/19/2011 Document Revised: 10/23/2016 Document Reviewed: 10/23/2016 Elsevier Interactive Patient Education  2018 [employer].  How to Take Your Blood Pressure You can take your blood pressure at home with a machine. You may need to check your blood pressure at home:  To check if you have high blood pressure (hypertension).  To check your blood pressure over time.  To make sure your blood pressure medicine is working.  Supplies needed: You will need a blood pressure machine, or monitor. You can buy one at a drugstore or online. When choosing one:  Choose one with an arm cuff.  Choose one that wraps around your upper arm. Only one finger should fit between your arm and the cuff.  Do not choose one that measures your blood pressure from your wrist or finger.  Your doctor can suggest a monitor. How to prepare Avoid these things for 30 minutes before checking your blood pressure:  Drinking caffeine.  Drinking alcohol.  Eating.  Smoking.  Exercising.  Five minutes  before checking your blood pressure:  Pee.  Sit in a dining chair. Avoid sitting in a soft couch or armchair.  Be quiet. Do not talk.  How to take your blood pressure Follow the instructions that came with your machine. If you have a digital blood pressure monitor, these may be the instructions: 1. Sit up straight. 2. Place your feet on the floor. Do not cross your ankles or legs. 3. Rest your left arm at the level of your heart. You may rest it on a table, desk, or chair. 4. Pull up your shirt sleeve. 5. Wrap the blood pressure cuff around the upper part of your left arm. The cuff should be 1 inch (2.5 cm) above your elbow. It is best to wrap the cuff around bare skin. 6. Fit the cuff snugly around your arm. You should be able to place only one finger between the cuff and your arm. 7. Put the cord inside the groove of your elbow. 8. Press the power button. 9.  Sit quietly while the cuff fills with air and loses air. 10. Write down the numbers on the screen. 11. Wait 2-3 minutes and then repeat steps 1-10.  What do the numbers mean? Two numbers make up your blood pressure. The first number is called systolic pressure. The second is called diastolic pressure. An example of a blood pressure reading is "120 over 80" (or 120/80). If you are an adult and do not have a medical condition, use this guide to find out if your blood pressure is normal: Normal  First number: below 120.  Second number: below 80. Elevated  First number: 120-129.  Second number: below 80. Hypertension stage 1  First number: 130-139.  Second number: 80-89. Hypertension stage 2  First number: 140 or above.  Second number: 59 or above. Your blood pressure is above normal even if only the top or bottom number is above normal. Follow these instructions at home:  Check your blood pressure as often as your doctor tells you to.  Take your monitor to your next doctor's appointment. Your doctor will: ? Make  sure you are using it correctly. ? Make sure it is working right.  Make sure you understand what your blood pressure numbers should be.  Tell your doctor if your medicines are causing side effects. Contact a doctor if:  Your blood pressure keeps being high. Get help right away if:  Your first blood pressure number is higher than 180.  Your second blood pressure number is higher than 120. This information is not intended to replace advice given to you by your health care provider. Make sure you discuss any questions you have with your health care provider. Document Released: 10/12/2008 Document Revised: 09/27/2016 Document Reviewed: 04/07/2016 Elsevier Interactive Patient Education  2018 [place]. Blood Pressure Record Sheet Your blood pressure on this visit to the emergency department or clinic is elevated. This does not necessarily mean you have high blood pressure (hypertension), but it does mean that your blood pressure needs to be rechecked. Many times your blood pressure can increase due to illness, pain, anxiety, or other factors. We recommend that you get a series of blood pressure readings done over a period of 5 days. It is best to get a reading in the morning and one in the evening. You should make sure to sit and relax for 1-5 minutes before the reading is taken. Write the readings down and make a follow-up appointment with your health care provider to discuss the results. If there is not a free clinic or a drug store with a blood-pressure-taking machine near you, you can purchase blood-pressure-taking equipment from a drug store. Having one in the home allows you the convenience of taking your blood pressure while you are home and relaxed. Blood Pressure Log Date: _______________________  a.m. _____________________  p.m. _____________________  Date: _______________________  a.m. _____________________  p.m. _____________________  Date: _______________________  a.m.  _____________________  p.m. _____________________  Date: _______________________  a.m. _____________________  p.m. _____________________  Date: _______________________  a.m. _____________________  p.m. _____________________ Date: _______________________  a.m. _____________________  p.m. _____________________  Date: _______________________  a.m. _____________________  p.m. _____________________  Date: _______________________  a.m. _____________________  p.m. _____________________  Date: _______________________  a.m. _____________________  p.m. _____________________  Date: _______________________  a.m. _____________________  p.m. _____________________ This information is not intended to replace advice given to you by your health care provider. Make sure you discuss any questions you have with your health care provider. Document Released: 07/29/2003 Document  Revised: 10/13/2016 Document Reviewed: 12/23/2013 Elsevier Interactive Patient Education  [employer].   IF you received an x-ray today, you will receive an invoice from [facility]. Please contact [facility] at [phone] with questions or concerns regarding your invoice.   IF you received labwork today, you will receive an invoice from [place]. Please contact [facility] at [phone] with questions or concerns regarding your invoice.   Our billing staff will not be able to assist you with questions regarding bills from these companies.  You will be contacted with the lab results as soon as they are available. The fastest way to get your results is to activate your My Chart account. Instructions are located on the last page of this paperwork. If you have not heard from [place] regarding the results in 2 weeks, please contact this office.

## 2018-09-05 ENCOUNTER — Encounter: Payer: Self-pay | Admitting: Physician Assistant

## 2018-09-05 LAB — VITAMIN B12: VITAMIN B 12: 662 pg/mL (ref 200–1100)

## 2018-09-05 LAB — TSH: TSH: 2.17 m[IU]/L

## 2018-09-05 LAB — VITAMIN D 25 HYDROXY (VIT D DEFICIENCY, FRACTURES): VIT D 25 HYDROXY: 42 ng/mL (ref 30–100)

## 2018-09-08 ENCOUNTER — Encounter: Payer: Self-pay | Admitting: Physician Assistant

## 2018-09-23 ENCOUNTER — Encounter: Payer: Self-pay | Admitting: Physician Assistant

## 2018-09-23 ENCOUNTER — Ambulatory Visit: Payer: BLUE CROSS/BLUE SHIELD | Admitting: Physician Assistant

## 2018-09-23 ENCOUNTER — Other Ambulatory Visit: Payer: Self-pay

## 2018-09-23 VITALS — BP 136/74 | HR 77 | Temp 98.6°F | Resp 12 | Ht 67.0 in | Wt 147.8 lb

## 2018-09-23 DIAGNOSIS — Z0130 Encounter for examination of blood pressure without abnormal findings: Secondary | ICD-10-CM

## 2018-09-23 DIAGNOSIS — F411 Generalized anxiety disorder: Secondary | ICD-10-CM | POA: Diagnosis not present

## 2018-09-23 MED ORDER — VENLAFAXINE HCL ER 37.5 MG PO CP24: 37.5000 mg | ORAL_CAPSULE | Freq: Every day | ORAL | 0 refills | Status: DC

## 2018-09-23 NOTE — Patient Instructions (Addendum)
  It was a pleasure seeing you today. As of now, let's keep everything the same. Continue checking blood pressure a few times per week. Goal is 130/80. Follow up with Dr. Nolon Rod in 6 months. Thank you for letting me participate in your health and well being.    If you have lab work done today you will be contacted with your lab results within the next 2 weeks.  If you have not heard from [place] then please contact us. The fastest way to get your results is to register for My Chart.   IF you received an x-ray today, you will receive an invoice from [facility]. Please contact [facility] at [phone] with questions or concerns regarding your invoice.   IF you received labwork today, you will receive an invoice from [facility]. Please contact [facility] at [phone] with questions or concerns regarding your invoice.   Our billing staff will not be able to assist you with questions regarding bills from these companies.  You will be contacted with the lab results as soon as they are available. The fastest way to get your results is to activate your My Chart account. Instructions are located on the last page of this paperwork. If you have not heard from [place] regarding the results in 2 weeks, please contact this office.

## 2018-09-23 NOTE — Progress Notes (Signed)
Man Bonneau  MRN: [id number] DOB: 1970-11-20  Subjective:  Jessica [name] is a 47 y.o. female seen in office today for a chief complaint of f/u on last visit. Last seen 09/04/18 for intermittent headaches. Was having panic attacks and elevated bp measures at home. Medication was altered.  Started on Effexor extended release 37.5 mg daily and given hydroxyzine as needed for panic attacks.  Encouraged her to monitor blood pressure and work on lifestyle modifications.  Today, notes she is tolerating the Effexor dose very well.  Feels much better.  She has not noticed any headaches in the past 2 weeks.  Has not had any panic attacks.  Anxiety feels much more controlled.  She has been checking her blood pressure consistently at home ranges are 814-481 systolically.  Denies chest pain, shortness of breath, hypertension, lower leg swelling, nausea, vomiting, abdominal pain.  No other questions or concerns today.   Review of Systems Per HPI Patient Active Problem List   Diagnosis Date Noted  . Beta thalassemia minor 08/12/2018  . Obsessive compulsive disorder 02/18/2014  . Major depressive disorder, recurrent episode, moderate ([place]) 05/14/2013  . Major depressive disorder, recurrent episode, severe, without mention of psychotic behavior 03/05/2013  . Major depressive disorder, recurrent episode, in full remission ([place]) 12/04/2012  . Social anxiety disorder 09/17/2012  . ANXIETY DISORDER, GENERALIZED 07/15/2008  . HYPERCHOLESTEROLEMIA 10/07/2007  . VITILIGO 10/07/2007  . ANEMIA, IRON DEFICIENCY, UNSPEC. 01/10/2007    Current Outpatient Medications on File Prior to Visit  Medication Sig Dispense Refill  . hydrOXYzine (ATARAX/VISTARIL) 25 MG tablet Take 0.5-1 tablets (12.5-25 mg total) by mouth daily as needed for anxiety. 30 tablet 0  . linaclotide (LINZESS) 145 MCG CAPS capsule Take 1 capsule (145 mcg total) by mouth daily before breakfast. 30 capsule 3  . pravastatin (PRAVACHOL) 40 MG  tablet TAKE 1 TABLET BY MOUTH EVERY DAY 90 tablet 2  . traZODone (DESYREL) 50 MG tablet TAKE 0.5-1 TABLETS (25-50 MG TOTAL) BY MOUTH AT BEDTIME AS NEEDED FOR SLEEP. 90 tablet 2  . venlafaxine XR (EFFEXOR-XR) 37.5 MG 24 hr capsule Take 1 capsule (37.5 mg total) by mouth daily with breakfast. 90 capsule 0   No current facility-administered medications on file prior to visit.     Allergies  Allergen Reactions  . Sulfonamide Derivatives     REACTION: hives     Objective:  BP 136/74 (BP Location: Right Arm, Patient Position: Sitting, Cuff Size: Normal)   Pulse 77   Temp 98.6 F (37 C) (Oral)   Resp 12   Ht 5\' 7"  (1.702 m)   Wt 147 lb 12.8 oz (67 kg)   LMP 09/16/2018   SpO2 100%   BMI 23.15 kg/m   Physical Exam  Constitutional: She is oriented to person, place, and time. She appears well-developed and well-nourished. No distress.  HENT:  Head: Normocephalic and atraumatic.  Mouth/Throat: Uvula is midline, oropharynx is clear and moist and mucous membranes are normal.  Eyes: Pupils are equal, round, and reactive to light. Conjunctivae and EOM are normal.  Neck: Normal range of motion.  Cardiovascular: Normal rate, regular rhythm, normal heart sounds and intact distal pulses.  Pulmonary/Chest: Effort normal and breath sounds normal. She has no wheezes. She has no rhonchi. She has no rales.  Musculoskeletal:       Right lower leg: She exhibits no swelling.       Left lower leg: She exhibits no swelling.  Neurological: She is alert and oriented to person,  place, and time.  Skin: Skin is warm and dry.  Psychiatric: She has a normal mood and affect.  Vitals reviewed.    Assessment and Plan :  1. ANXIETY DISORDER, GENERALIZED Tolerating Effexor XR very well.  Anxiety controlled.  Continue with current dose.  Follow-up in 6 months for reevaluation or sooner if symptoms worsen or she develops new concerning symptoms. - venlafaxine XR (EFFEXOR-XR) 37.5 MG 24 hr capsule; Take 1 capsule  (37.5 mg total) by mouth daily with breakfast.  Dispense: 90 capsule; Refill: 0  2. Blood pressure check Home BP readings well within normal limits.  Patient brought home blood pressure cuff to office today and it was consistent with our in office reading.  Patient reassured that she does not need blood pressure medication at this time.  Continue working on lifestyle modifications and monitoring blood pressure at home given strict return precautions.   Tenna Delaine PA-C  [facility] 09/23/2018 10:45 AM

## 2018-09-25 ENCOUNTER — Other Ambulatory Visit: Payer: Self-pay | Admitting: Physician Assistant

## 2018-09-25 DIAGNOSIS — F411 Generalized anxiety disorder: Secondary | ICD-10-CM

## 2018-10-23 ENCOUNTER — Telehealth: Payer: Self-pay | Admitting: Internal Medicine

## 2018-10-23 NOTE — Telephone Encounter (Signed)
Due to provider changes moved 12/30 lab/fu from AM to PM. Spoke with patient re new time for lab/fu 12/30 @ 2:30 pm.

## 2018-11-11 ENCOUNTER — Other Ambulatory Visit: Payer: Self-pay

## 2018-11-11 ENCOUNTER — Encounter: Payer: Self-pay | Admitting: Internal Medicine

## 2018-11-11 ENCOUNTER — Ambulatory Visit: Payer: Self-pay | Admitting: Internal Medicine

## 2018-11-11 ENCOUNTER — Inpatient Hospital Stay: Payer: BLUE CROSS/BLUE SHIELD | Admitting: Internal Medicine

## 2018-11-11 ENCOUNTER — Inpatient Hospital Stay: Payer: BLUE CROSS/BLUE SHIELD | Attending: Internal Medicine

## 2018-11-11 VITALS — BP 146/91 | HR 77 | Temp 98.4°F | Resp 17 | Ht 67.0 in | Wt 146.3 lb

## 2018-11-11 DIAGNOSIS — Z79899 Other long term (current) drug therapy: Secondary | ICD-10-CM

## 2018-11-11 DIAGNOSIS — D472 Monoclonal gammopathy: Secondary | ICD-10-CM | POA: Insufficient documentation

## 2018-11-11 DIAGNOSIS — D500 Iron deficiency anemia secondary to blood loss (chronic): Secondary | ICD-10-CM

## 2018-11-11 DIAGNOSIS — N920 Excessive and frequent menstruation with regular cycle: Secondary | ICD-10-CM | POA: Diagnosis not present

## 2018-11-11 DIAGNOSIS — D563 Thalassemia minor: Secondary | ICD-10-CM | POA: Insufficient documentation

## 2018-11-11 LAB — CBC WITH DIFFERENTIAL (CANCER CENTER ONLY)
Abs Immature Granulocytes: 0.02 10*3/uL (ref 0.00–0.07)
BASOS ABS: 0.1 10*3/uL (ref 0.0–0.1)
BASOS PCT: 1 %
EOS ABS: 0.1 10*3/uL (ref 0.0–0.5)
Eosinophils Relative: 1 %
HCT: 36.2 % (ref 36.0–46.0)
Hemoglobin: 10.9 g/dL — ABNORMAL LOW (ref 12.0–15.0)
IMMATURE GRANULOCYTES: 0 %
Lymphocytes Relative: 39 %
Lymphs Abs: 2.7 10*3/uL (ref 0.7–4.0)
MCH: 22.6 pg — ABNORMAL LOW (ref 26.0–34.0)
MCHC: 30.1 g/dL (ref 30.0–36.0)
MCV: 75.1 fL — AB (ref 80.0–100.0)
MONOS PCT: 7 %
Monocytes Absolute: 0.5 10*3/uL (ref 0.1–1.0)
NEUTROS PCT: 52 %
NRBC: 0 % (ref 0.0–0.2)
Neutro Abs: 3.6 10*3/uL (ref 1.7–7.7)
Platelet Count: 293 10*3/uL (ref 150–400)
RBC: 4.82 MIL/uL (ref 3.87–5.11)
RDW: 15.1 % (ref 11.5–15.5)
WBC Count: 6.9 10*3/uL (ref 4.0–10.5)

## 2018-11-11 LAB — FERRITIN: FERRITIN: 345 ng/mL — AB (ref 11–307)

## 2018-11-11 LAB — IRON AND TIBC
Iron: 83 ug/dL (ref 41–142)
Saturation Ratios: 34 % (ref 21–57)
TIBC: 240 ug/dL (ref 236–444)
UIBC: 158 ug/dL (ref 120–384)

## 2018-11-11 NOTE — Progress Notes (Signed)
[place] [phone]   [phone]  OFFICE PROGRESS NOTE  [name], MD [place] [address]  DIAGNOSIS:  1) Microcytic, hypochromic anemia secondary to iron deficiency secondary to menorrhagia. 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  PRIOR THERAPY: Feraheme infusion x2 doses last dose was giving June 14, 2018  CURRENT THERAPY: None  INTERVAL HISTORY: Jessica [name] 47 y.o. female returns to the clinic today for follow-up visit accompanied by her husband.  The patient is feeling fine today with no concerning complaints except for intermittent fatigue.  She denied having any recent chest pain, shortness breath, cough or hemoptysis.  She has no fever or chills.  She has no nausea, vomiting, diarrhea or constipation.  She had several studies performed in the last few months including myeloma panel in addition to repeat CBC, iron study and ferritin earlier today.  She is here for evaluation and discussion of her lab results.  MEDICAL HISTORY: Past Medical History:  Diagnosis Date  . Allergy   . Anemia   . Anxiety   . Depression   . Hyperlipidemia     ALLERGIES:  is allergic to sulfonamide derivatives.  MEDICATIONS:  Current Outpatient Medications  Medication Sig Dispense Refill  . hydrOXYzine (ATARAX/VISTARIL) 25 MG tablet TAKE 0.5-1 TABLETS (12.5-25 MG TOTAL) BY MOUTH DAILY AS NEEDED FOR ANXIETY. 30 tablet 2  . linaclotide (LINZESS) 145 MCG CAPS capsule Take 1 capsule (145 mcg total) by mouth daily before breakfast. 30 capsule 3  . pravastatin (PRAVACHOL) 40 MG tablet TAKE 1 TABLET BY MOUTH EVERY DAY 90 tablet 2  . traZODone (DESYREL) 50 MG tablet TAKE 0.5-1 TABLETS (25-50 MG TOTAL) BY MOUTH AT BEDTIME AS NEEDED FOR SLEEP. 90 tablet 2  . venlafaxine XR (EFFEXOR-XR) 37.5 MG 24 hr capsule Take 1 capsule (37.5 mg total) by mouth daily with breakfast. 90 capsule 0   No current facility-administered  medications for this visit.     SURGICAL HISTORY:  Past Surgical History:  Procedure Laterality Date  . TUBAL LIGATION      REVIEW OF SYSTEMS:  A comprehensive review of systems was negative except for: Constitutional: positive for fatigue   PHYSICAL EXAMINATION: General appearance: alert, cooperative, fatigued and no distress Head: Normocephalic, without obvious abnormality, atraumatic Neck: no adenopathy, no JVD, supple, symmetrical, trachea midline and thyroid not enlarged, symmetric, no tenderness/mass/nodules Lymph nodes: Cervical, supraclavicular, and axillary nodes normal. Resp: clear to auscultation bilaterally Back: symmetric, no curvature. ROM normal. No CVA tenderness. Cardio: regular rate and rhythm, S1, S2 normal, no murmur, click, rub or gallop GI: soft, non-tender; bowel sounds normal; no masses,  no organomegaly Extremities: extremities normal, atraumatic, no cyanosis or edema  ECOG PERFORMANCE STATUS: 1 - Symptomatic but completely ambulatory  Blood pressure (!) 146/91, pulse 77, temperature 98.4 F (36.9 C), temperature source Oral, resp. rate 17, height '5\' 7"'  (1.702 m), weight 146 lb 4.8 oz (66.4 kg), SpO2 100 %.  LABORATORY DATA: Lab Results  Component Value Date   WBC 6.9 11/11/2018   HGB 10.9 (L) 11/11/2018   HCT 36.2 11/11/2018   MCV 75.1 (L) 11/11/2018   PLT 293 11/11/2018      Chemistry      Component Value Date/Time   NA 142 08/12/2018 1206   NA 141 04/11/2018 1752   K 4.5 08/12/2018 1206   CL 107 08/12/2018 1206   CO2 26 08/12/2018 1206   BUN 13 08/12/2018 1206   BUN  11 04/11/2018 1752   CREATININE 1.03 (H) 08/12/2018 1206   CREATININE 0.98 09/10/2017 1333      Component Value Date/Time   CALCIUM 10.2 08/12/2018 1206   ALKPHOS 49 08/12/2018 1206   AST 15 08/12/2018 1206   ALT 14 08/12/2018 1206   BILITOT 0.4 08/12/2018 1206       RADIOGRAPHIC STUDIES: No results found.  ASSESSMENT AND PLAN: This is a very pleasant 47 years old  African-American female presented for evaluation of microcytic hypochromic anemia likely secondary to beta thalassemia minor as well as iron deficiency.   She was treated in the past with Feraheme infusion and felt a little bit better. Myeloma panel performed in September showed elevated free kappa light chain concerning for monoclonal gammopathy and will need to rule out any underlying multiple myeloma. I recommended for the patient to proceed with a bone marrow biopsy and aspirate for further evaluation of her condition. Regarding the anemia, her hemoglobin and hematocrit are low but stable.  Iron study and ferritin are still pending.  If these showed significant iron deficiency, I may consider the patient for Feraheme infusion again. I will see her back for follow-up visit in 3 weeks for reevaluation and discussion of her bone marrow biopsy and aspirate results. The patient was advised to call immediately if she has any concerning symptoms in the interval. The patient voices understanding of current disease status and treatment options and is in agreement with the current care plan.  All questions were answered. The patient knows to call the clinic with any problems, questions or concerns. We can certainly see the patient much sooner if necessary.  I spent 10 minutes counseling the patient face to face. The total time spent in the appointment was 15 minutes.  Disclaimer: This note was dictated with voice recognition software. Similar sounding words can inadvertently be transcribed and may not be corrected upon review.

## 2018-11-12 ENCOUNTER — Telehealth: Payer: Self-pay | Admitting: Internal Medicine

## 2018-11-12 NOTE — Telephone Encounter (Signed)
Scheduled appt per 12/31 sch message and 12/30 los - spoke with patient and they are aware of appt date and time

## 2018-11-15 ENCOUNTER — Other Ambulatory Visit: Payer: Self-pay | Admitting: Family Medicine

## 2018-11-15 DIAGNOSIS — Z1231 Encounter for screening mammogram for malignant neoplasm of breast: Secondary | ICD-10-CM

## 2018-11-20 ENCOUNTER — Other Ambulatory Visit: Payer: Self-pay | Admitting: Family Medicine

## 2018-11-25 ENCOUNTER — Inpatient Hospital Stay: Payer: BLUE CROSS/BLUE SHIELD | Attending: Internal Medicine | Admitting: Adult Health

## 2018-11-25 ENCOUNTER — Inpatient Hospital Stay: Payer: BLUE CROSS/BLUE SHIELD

## 2018-11-25 VITALS — BP 127/75 | HR 69 | Temp 98.9°F | Resp 18

## 2018-11-25 DIAGNOSIS — D509 Iron deficiency anemia, unspecified: Secondary | ICD-10-CM | POA: Diagnosis not present

## 2018-11-25 DIAGNOSIS — D472 Monoclonal gammopathy: Secondary | ICD-10-CM | POA: Insufficient documentation

## 2018-11-25 DIAGNOSIS — D500 Iron deficiency anemia secondary to blood loss (chronic): Secondary | ICD-10-CM | POA: Diagnosis not present

## 2018-11-25 DIAGNOSIS — D563 Thalassemia minor: Secondary | ICD-10-CM | POA: Insufficient documentation

## 2018-11-25 DIAGNOSIS — C9000 Multiple myeloma not having achieved remission: Secondary | ICD-10-CM | POA: Diagnosis not present

## 2018-11-25 DIAGNOSIS — N920 Excessive and frequent menstruation with regular cycle: Secondary | ICD-10-CM | POA: Diagnosis not present

## 2018-11-25 DIAGNOSIS — D508 Other iron deficiency anemias: Principal | ICD-10-CM

## 2018-11-25 DIAGNOSIS — D72822 Plasmacytosis: Secondary | ICD-10-CM | POA: Diagnosis not present

## 2018-11-25 LAB — CBC WITH DIFFERENTIAL (CANCER CENTER ONLY)
ABS IMMATURE GRANULOCYTES: 0.01 10*3/uL (ref 0.00–0.07)
Basophils Absolute: 0.1 10*3/uL (ref 0.0–0.1)
Basophils Relative: 1 %
Eosinophils Absolute: 0 10*3/uL (ref 0.0–0.5)
Eosinophils Relative: 0 %
HCT: 36.5 % (ref 36.0–46.0)
Hemoglobin: 11 g/dL — ABNORMAL LOW (ref 12.0–15.0)
Immature Granulocytes: 0 %
LYMPHS PCT: 31 %
Lymphs Abs: 1.8 10*3/uL (ref 0.7–4.0)
MCH: 22.6 pg — AB (ref 26.0–34.0)
MCHC: 30.1 g/dL (ref 30.0–36.0)
MCV: 74.9 fL — ABNORMAL LOW (ref 80.0–100.0)
Monocytes Absolute: 0.6 10*3/uL (ref 0.1–1.0)
Monocytes Relative: 11 %
Neutro Abs: 3.2 10*3/uL (ref 1.7–7.7)
Neutrophils Relative %: 57 %
Platelet Count: 261 10*3/uL (ref 150–400)
RBC: 4.87 MIL/uL (ref 3.87–5.11)
RDW: 15.1 % (ref 11.5–15.5)
WBC Count: 5.7 10*3/uL (ref 4.0–10.5)
nRBC: 0 % (ref 0.0–0.2)

## 2018-11-25 MED ORDER — LIDOCAINE HCL 2 % IJ SOLN
INTRAMUSCULAR | Status: AC
  Filled 2018-11-25: qty 20

## 2018-11-25 NOTE — Progress Notes (Signed)
INDICATION: refractory anemia, MGUS  Bone Marrow Biopsy and Aspiration Procedure Note   Informed consent was obtained and potential risks including bleeding, infection and pain were reviewed with the patient.  The patient's name, date of birth, identification, consent and allergies were verified prior to the start of procedure and time out was performed.  The left posterior iliac crest was chosen as the site of biopsy.  The skin was prepped with ChloraPrep.   10 cc of 2% lidocaine was used to provide local anaesthesia.   10 cc of bone marrow aspirate was obtained followed by 1cm biopsy.  Pressure was applied to the biopsy site and bandage was placed over the biopsy site. Patient was made to lie on the back for 30 mins prior to discharge.  The procedure was tolerated well. COMPLICATIONS: None BLOOD LOSS: none The patient was discharged home in stable condition with a 1 week follow up to review results.  Patient was provided with post bone marrow biopsy instructions and instructed to call if there was any bleeding or worsening pain.  Specimens sent for flow cytometry, cytogenetics and additional studies.  Signed Scot Dock, NP

## 2018-11-25 NOTE — Patient Instructions (Signed)
[facility] Discharge Instructions for Post Bone Marrow Procedure  Today you had a bone marrow biopsy and aspirate of Right hip Please keep the pressure dressing in place for at least 24 hours.  Have someone check your dressing periodically for bleeding.  If needed you can reapply a pressure dressing to the site.  Take pain medication  as directed.  IF BLEEDING REOCCURS THAT SHOULD BE REPORTED IMMEDIATELY. Call the [facility] at [phone] if during business hours. Or report to the Emergency Room.   I have been informed and understand all the instructions given to me. I know to contact the clinic, my physician, or go to the Emergency Department if any problems should occur. I do not have any questions at this time, but understand that I may call the clinic during office hours at [phone]  should I have any questions or need assistance in obtaining follow up care.    __________________________________________  _____________  __________ Signature of Patient or Authorized Representative            Date                   Time    __________________________________________ Nurse's Signature

## 2018-11-25 NOTE — Progress Notes (Signed)
PRessure dressing dry and intact to right bone marrow procedure site. Pt tolerated well.

## 2018-11-26 ENCOUNTER — Telehealth: Payer: Self-pay | Admitting: Adult Health

## 2018-11-26 NOTE — Telephone Encounter (Signed)
Per 11/3 no los °

## 2018-12-03 ENCOUNTER — Encounter: Payer: Self-pay | Admitting: Internal Medicine

## 2018-12-03 ENCOUNTER — Telehealth: Payer: Self-pay | Admitting: Internal Medicine

## 2018-12-03 ENCOUNTER — Inpatient Hospital Stay: Payer: BLUE CROSS/BLUE SHIELD | Admitting: Internal Medicine

## 2018-12-03 VITALS — BP 137/79 | HR 81 | Temp 98.7°F | Resp 18 | Ht 67.0 in | Wt 148.5 lb

## 2018-12-03 DIAGNOSIS — D508 Other iron deficiency anemias: Secondary | ICD-10-CM | POA: Diagnosis not present

## 2018-12-03 DIAGNOSIS — D563 Thalassemia minor: Secondary | ICD-10-CM

## 2018-12-03 DIAGNOSIS — D472 Monoclonal gammopathy: Secondary | ICD-10-CM

## 2018-12-03 DIAGNOSIS — N920 Excessive and frequent menstruation with regular cycle: Secondary | ICD-10-CM | POA: Diagnosis not present

## 2018-12-03 DIAGNOSIS — D500 Iron deficiency anemia secondary to blood loss (chronic): Secondary | ICD-10-CM | POA: Diagnosis not present

## 2018-12-03 DIAGNOSIS — D4989 Neoplasm of unspecified behavior of other specified sites: Secondary | ICD-10-CM

## 2018-12-03 NOTE — Telephone Encounter (Signed)
Scheduled appt per 01/21 los. ° °Printed calendar and avs. °

## 2018-12-03 NOTE — Progress Notes (Signed)
[place] [phone]   Fax:(336) [phone]  OFFICE PROGRESS NOTE  Jessica Moron, MD [place] [address]  DIAGNOSIS:  1) Microcytic, hypochromic anemia secondary to iron deficiency secondary to menorrhagia. 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  PRIOR THERAPY: Feraheme infusion x2 doses last dose was giving June 14, 2018  CURRENT THERAPY: None  INTERVAL HISTORY: Jessica [name] 48 y.o. female returns to the clinic today accompanied by her daughter.  The patient is feeling fine today with no concerning complaints.  She denied having any significant fatigue or weakness.  She denied having any chest pain, shortness of breath, cough or hemoptysis.  She has no nausea, vomiting, diarrhea or constipation.  She has no recent weight loss or night sweats.  She underwent a bone marrow biopsy and aspirate recently and she is here for evaluation and discussion of her biopsy results and recommendation regarding her condition.   MEDICAL HISTORY: Past Medical History:  Diagnosis Date  . Allergy   . Anemia   . Anxiety   . Depression   . Hyperlipidemia     ALLERGIES:  is allergic to sulfonamide derivatives.  MEDICATIONS:  Current Outpatient Medications  Medication Sig Dispense Refill  . hydrOXYzine (ATARAX/VISTARIL) 25 MG tablet TAKE 0.5-1 TABLETS (12.5-25 MG TOTAL) BY MOUTH DAILY AS NEEDED FOR ANXIETY. 30 tablet 2  . pravastatin (PRAVACHOL) 40 MG tablet TAKE 1 TABLET BY MOUTH EVERY DAY 90 tablet 2  . traZODone (DESYREL) 50 MG tablet TAKE 0.5-1 TABLETS (25-50 MG TOTAL) BY MOUTH AT BEDTIME AS NEEDED FOR SLEEP. 90 tablet 2  . venlafaxine XR (EFFEXOR-XR) 37.5 MG 24 hr capsule Take 1 capsule (37.5 mg total) by mouth daily with breakfast. 90 capsule 0   No current facility-administered medications for this visit.     SURGICAL HISTORY:  Past Surgical History:  Procedure Laterality Date  . TUBAL  LIGATION      REVIEW OF SYSTEMS:  Constitutional: negative Eyes: negative Ears, nose, mouth, throat, and face: negative Respiratory: negative Cardiovascular: negative Gastrointestinal: negative Genitourinary:negative Integument/breast: negative Hematologic/lymphatic: negative Musculoskeletal:negative Neurological: negative Behavioral/Psych: negative Endocrine: negative Allergic/Immunologic: negative   PHYSICAL EXAMINATION: General appearance: alert, cooperative and no distress Head: Normocephalic, without obvious abnormality, atraumatic Neck: no adenopathy, no JVD, supple, symmetrical, trachea midline and thyroid not enlarged, symmetric, no tenderness/mass/nodules Lymph nodes: Cervical, supraclavicular, and axillary nodes normal. Resp: clear to auscultation bilaterally Back: symmetric, no curvature. ROM normal. No CVA tenderness. Cardio: regular rate and rhythm, S1, S2 normal, no murmur, click, rub or gallop GI: soft, non-tender; bowel sounds normal; no masses,  no organomegaly Extremities: extremities normal, atraumatic, no cyanosis or edema Neurologic: Alert and oriented X 3, normal strength and tone. Normal symmetric reflexes. Normal coordination and gait  ECOG PERFORMANCE STATUS: 1 - Symptomatic but completely ambulatory  Blood pressure 137/79, pulse 81, temperature 98.7 F (37.1 C), temperature source Oral, resp. rate 18, height _0  (1.702 m), weight 148 lb 8 oz (67.4 kg), SpO2 100 %.  LABORATORY DATA: Lab Results  Component Value Date   WBC 5.7 11/25/2018   HGB 11.0 (L) 11/25/2018   HCT 36.5 11/25/2018   MCV 74.9 (L) 11/25/2018   PLT 261 11/25/2018      Chemistry      Component Value Date/Time   NA 142 08/12/2018 1206   NA 141 04/11/2018 1752   K 4.5 08/12/2018 1206   CL 107 08/12/2018 1206   CO2 26  08/12/2018 1206   BUN 13 08/12/2018 1206   BUN 11 04/11/2018 1752   CREATININE 1.03 (H) 08/12/2018 1206   CREATININE 0.98 09/10/2017 1333      Component  Value Date/Time   CALCIUM 10.2 08/12/2018 1206   ALKPHOS 49 08/12/2018 1206   AST 15 08/12/2018 1206   ALT 14 08/12/2018 1206   BILITOT 0.4 08/12/2018 1206       RADIOGRAPHIC STUDIES: No results found.  ASSESSMENT AND PLAN: This is a very pleasant 48 years old African-American female presented for evaluation of microcytic hypochromic anemia likely secondary to beta thalassemia minor as well as iron deficiency.   She was treated in the past with Feraheme infusion and felt a little bit better. Myeloma panel performed in September showed elevated free kappa light chain concerning for monoclonal gammopathy and will need to rule out any underlying multiple myeloma. The patient underwent a bone marrow biopsy and aspirate recently and that showed increased level of monoclonal plasma cells 5-10% suspicious for plasma cell neoplasm. I had a lengthy discussion with the patient today about her current condition and treatment options. I recommended for the patient to have skeletal bone survey performed. 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  I will continue to monitor her with repeat myeloma panel at regular basis. I will see her back for follow-up visit in 3 months for evaluation with repeat myeloma panel as well as iron study and ferritin. I would consider the patient for treatment if she has any progressive findings. For the anemia, her hemoglobin and hematocrit are stable.  We will continue to monitor for now. She was advised to call immediately if she has any concerning symptoms in the interval. The patient voices understanding of current disease status and treatment options and is in agreement with the current care plan.  All questions were answered. The patient knows to call the clinic with any problems, questions or concerns. We can certainly see the patient much sooner if necessary.  Disclaimer: This note was dictated with voice recognition software. Similar sounding  words can inadvertently be transcribed and may not be corrected upon review.

## 2018-12-04 ENCOUNTER — Encounter (HOSPITAL_COMMUNITY): Payer: Self-pay | Admitting: Internal Medicine

## 2018-12-11 ENCOUNTER — Other Ambulatory Visit: Payer: Self-pay | Admitting: Family Medicine

## 2018-12-11 DIAGNOSIS — F411 Generalized anxiety disorder: Secondary | ICD-10-CM

## 2018-12-11 NOTE — Telephone Encounter (Signed)
Requested medication (s) are due for refill today: Yes  Requested medication (s) are on the active medication list: Yes  Last refill:  09/25/18  Future visit scheduled: Yes  Notes to clinic:  Pharmacy needs diagnosis code.    Requested Prescriptions  Pending Prescriptions Disp Refills   hydrOXYzine (ATARAX/VISTARIL) 25 MG tablet [Pharmacy Med Name: HYDROXYZINE HCL 25 MG TABLET] 90 tablet 1    Sig: TAKE 0.5-1 TABLETS (12.5-25 MG TOTAL) BY MOUTH DAILY AS NEEDED FOR ANXIETY.     Ear, Nose, and Throat:  Antihistamines Passed - 12/11/2018  1:34 PM      Passed - Valid encounter within last 12 months    Recent Outpatient Visits          2 months ago [place], GENERALIZED   Primary [facility] at [facility], [place] D, PA-C   3 months ago [place], GENERALIZED   Primary Care at [place], [place] D, PA-C   7 months ago Iron deficiency anemia due to chronic blood loss   Primary Care at [place], MD   8 months ago Heart palpitations   Primary [facility] at [facility], Arlie Solomons, MD   1 year ago Annual physical exam   [facility] at [facility], Laurey Arrow, MD      Future Appointments            In 3 months [name], MD Primary Care at [place], [facility]   In 6 months [name], MD Primary Care at [place], [facility]

## 2018-12-12 NOTE — Telephone Encounter (Signed)
pls advise requesting 90 day supply

## 2018-12-16 ENCOUNTER — Ambulatory Visit (HOSPITAL_COMMUNITY)
Admission: RE | Admit: 2018-12-16 | Discharge: 2018-12-16 | Disposition: A | Discharge status: Home / Self Care | Payer: BLUE CROSS/BLUE SHIELD | Source: Ambulatory Visit | Attending: Internal Medicine | Admitting: Internal Medicine

## 2018-12-16 DIAGNOSIS — D4989 Neoplasm of unspecified behavior of other specified sites: Secondary | ICD-10-CM | POA: Diagnosis not present

## 2018-12-16 DIAGNOSIS — D561 Beta thalassemia: Secondary | ICD-10-CM | POA: Diagnosis not present

## 2018-12-23 ENCOUNTER — Ambulatory Visit
Admission: RE | Admit: 2018-12-23 | Discharge: 2018-12-23 | Disposition: A | Discharge status: Home / Self Care | Payer: BLUE CROSS/BLUE SHIELD | Source: Ambulatory Visit | Attending: Family Medicine | Admitting: Family Medicine

## 2018-12-23 DIAGNOSIS — Z1231 Encounter for screening mammogram for malignant neoplasm of breast: Secondary | ICD-10-CM | POA: Diagnosis not present

## 2018-12-25 ENCOUNTER — Other Ambulatory Visit: Payer: Self-pay | Admitting: Family Medicine

## 2018-12-25 DIAGNOSIS — R928 Other abnormal and inconclusive findings on diagnostic imaging of breast: Secondary | ICD-10-CM

## 2018-12-30 ENCOUNTER — Ambulatory Visit
Admission: RE | Admit: 2018-12-30 | Discharge: 2018-12-30 | Disposition: A | Discharge status: Home / Self Care | Payer: BLUE CROSS/BLUE SHIELD | Source: Ambulatory Visit | Attending: Family Medicine | Admitting: Family Medicine

## 2018-12-30 DIAGNOSIS — R928 Other abnormal and inconclusive findings on diagnostic imaging of breast: Secondary | ICD-10-CM

## 2018-12-30 DIAGNOSIS — R922 Inconclusive mammogram: Secondary | ICD-10-CM | POA: Diagnosis not present

## 2018-12-30 DIAGNOSIS — N6489 Other specified disorders of breast: Secondary | ICD-10-CM | POA: Diagnosis not present

## 2019-01-16 ENCOUNTER — Telehealth: Payer: Self-pay | Admitting: Internal Medicine

## 2019-01-16 NOTE — Telephone Encounter (Signed)
Moved afternoon appt to morning on 04/21. Called to confirm with patient.

## 2019-01-24 ENCOUNTER — Other Ambulatory Visit: Payer: Self-pay | Admitting: Family Medicine

## 2019-02-15 ENCOUNTER — Other Ambulatory Visit: Payer: Self-pay | Admitting: Physician Assistant

## 2019-02-15 DIAGNOSIS — F411 Generalized anxiety disorder: Secondary | ICD-10-CM

## 2019-02-21 ENCOUNTER — Telehealth: Payer: Self-pay | Admitting: Internal Medicine

## 2019-02-21 NOTE — Telephone Encounter (Signed)
Tried to call °

## 2019-02-25 ENCOUNTER — Other Ambulatory Visit: Payer: Self-pay

## 2019-02-25 ENCOUNTER — Inpatient Hospital Stay: Payer: BLUE CROSS/BLUE SHIELD | Attending: Internal Medicine

## 2019-02-25 DIAGNOSIS — D4989 Neoplasm of unspecified behavior of other specified sites: Secondary | ICD-10-CM

## 2019-02-25 DIAGNOSIS — D509 Iron deficiency anemia, unspecified: Secondary | ICD-10-CM | POA: Insufficient documentation

## 2019-02-25 DIAGNOSIS — D500 Iron deficiency anemia secondary to blood loss (chronic): Secondary | ICD-10-CM

## 2019-02-25 DIAGNOSIS — D563 Thalassemia minor: Secondary | ICD-10-CM | POA: Diagnosis not present

## 2019-02-25 LAB — CBC WITH DIFFERENTIAL (CANCER CENTER ONLY)
Abs Immature Granulocytes: 0 10*3/uL (ref 0.00–0.07)
Basophils Absolute: 0.1 10*3/uL (ref 0.0–0.1)
Basophils Relative: 1 %
Eosinophils Absolute: 0.1 10*3/uL (ref 0.0–0.5)
Eosinophils Relative: 1 %
HCT: 37.1 % (ref 36.0–46.0)
Hemoglobin: 11.1 g/dL — ABNORMAL LOW (ref 12.0–15.0)
Immature Granulocytes: 0 %
Lymphocytes Relative: 40 %
Lymphs Abs: 2.2 10*3/uL (ref 0.7–4.0)
MCH: 22.8 pg — ABNORMAL LOW (ref 26.0–34.0)
MCHC: 29.9 g/dL — ABNORMAL LOW (ref 30.0–36.0)
MCV: 76.2 fL — ABNORMAL LOW (ref 80.0–100.0)
Monocytes Absolute: 0.4 10*3/uL (ref 0.1–1.0)
Monocytes Relative: 7 %
Neutro Abs: 2.8 10*3/uL (ref 1.7–7.7)
Neutrophils Relative %: 51 %
Platelet Count: 288 10*3/uL (ref 150–400)
RBC: 4.87 MIL/uL (ref 3.87–5.11)
RDW: 15.9 % — ABNORMAL HIGH (ref 11.5–15.5)
WBC Count: 5.5 10*3/uL (ref 4.0–10.5)
nRBC: 0 % (ref 0.0–0.2)

## 2019-02-25 LAB — IRON AND TIBC
Iron: 98 ug/dL (ref 41–142)
Saturation Ratios: 42 % (ref 21–57)
TIBC: 233 ug/dL — ABNORMAL LOW (ref 236–444)
UIBC: 135 ug/dL (ref 120–384)

## 2019-02-25 LAB — CMP (CANCER CENTER ONLY)
ALT: 15 U/L (ref 0–44)
AST: 18 U/L (ref 15–41)
Albumin: 4 g/dL (ref 3.5–5.0)
Alkaline Phosphatase: 51 U/L (ref 38–126)
Anion gap: 11 (ref 5–15)
BUN: 11 mg/dL (ref 6–20)
CO2: 27 mmol/L (ref 22–32)
Calcium: 9.4 mg/dL (ref 8.9–10.3)
Chloride: 103 mmol/L (ref 98–111)
Creatinine: 1.02 mg/dL — ABNORMAL HIGH (ref 0.44–1.00)
GFR, Est AFR Am: >60 mL/min (ref >60)
GFR, Estimated: >60 mL/min (ref >60)
Glucose, Bld: 103 mg/dL — ABNORMAL HIGH (ref 70–99)
Potassium: 4 mmol/L (ref 3.5–5.1)
Sodium: 141 mmol/L (ref 135–145)
Total Bilirubin: 0.3 mg/dL (ref 0.3–1.2)
Total Protein: 8.2 g/dL — ABNORMAL HIGH (ref 6.5–8.1)

## 2019-02-25 LAB — LACTATE DEHYDROGENASE: LDH: 135 U/L (ref 98–192)

## 2019-02-25 LAB — FERRITIN: Ferritin: 347 ng/mL — ABNORMAL HIGH (ref 11–307)

## 2019-02-26 LAB — KAPPA/LAMBDA LIGHT CHAINS
Kappa free light chain: 60.3 mg/L — ABNORMAL HIGH (ref 3.3–19.4)
Kappa, lambda light chain ratio: 4.19 — ABNORMAL HIGH (ref 0.26–1.65)
Lambda free light chains: 14.4 mg/L (ref 5.7–26.3)

## 2019-02-26 LAB — IGG, IGA, IGM
IgA: 283 mg/dL (ref 87–352)
IgG (Immunoglobin G), Serum: 1567 mg/dL (ref 586–1602)
IgM (Immunoglobulin M), Srm: 196 mg/dL (ref 26–217)

## 2019-02-26 LAB — BETA 2 MICROGLOBULIN, SERUM: Beta-2 Microglobulin: 1.4 mg/L (ref 0.6–2.4)

## 2019-03-03 ENCOUNTER — Telehealth: Payer: Self-pay | Admitting: Internal Medicine

## 2019-03-03 NOTE — Telephone Encounter (Signed)
Spoke with patient and she did not want to Jessica [name] for tomorrow's appointment. She was okay with a phone call visit and requested to be called at [phone].

## 2019-03-04 ENCOUNTER — Inpatient Hospital Stay (HOSPITAL_BASED_OUTPATIENT_CLINIC_OR_DEPARTMENT_OTHER): Payer: BLUE CROSS/BLUE SHIELD | Admitting: Internal Medicine

## 2019-03-04 ENCOUNTER — Ambulatory Visit: Payer: Self-pay | Admitting: Internal Medicine

## 2019-03-04 ENCOUNTER — Other Ambulatory Visit: Payer: BLUE CROSS/BLUE SHIELD

## 2019-03-04 ENCOUNTER — Encounter: Payer: Self-pay | Admitting: Internal Medicine

## 2019-03-04 DIAGNOSIS — Z79899 Other long term (current) drug therapy: Secondary | ICD-10-CM | POA: Diagnosis not present

## 2019-03-04 DIAGNOSIS — N920 Excessive and frequent menstruation with regular cycle: Secondary | ICD-10-CM

## 2019-03-04 DIAGNOSIS — D500 Iron deficiency anemia secondary to blood loss (chronic): Secondary | ICD-10-CM | POA: Diagnosis not present

## 2019-03-04 DIAGNOSIS — D472 Monoclonal gammopathy: Secondary | ICD-10-CM | POA: Diagnosis not present

## 2019-03-04 NOTE — Progress Notes (Signed)
[place] Telephone:([phone]   [phone]) [phone]  PROGRESS NOTE FOR TELEMEDICINE VISITS  [name], MD [place] [address]  I connected with@ on 03/04/19 at  8:45 AM EDT by telephone visit and verified that I am speaking with the correct person using two identifiers.   I discussed the limitations, risks, security and privacy concerns of performing an evaluation and management service by telemedicine and the availability of in-person appointments. I also discussed with the patient that there may be a patient responsible charge related to this service. The patient expressed understanding and agreed to proceed.  Other persons participating in the visit and their role in the encounter:  None  Patient's location:  Home Provider's location: [place]  DIAGNOSIS:  1) Microcytic, hypochromic anemia secondary to iron deficiency secondary to menorrhagia. 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  PRIOR THERAPY: Feraheme infusion x2 doses last dose was giving June 14, 2018  CURRENT THERAPY: Over-the-counter ferrous sulfate 1 tablet p.o. daily  INTERVAL HISTORY: Jessica [name] 48 y.o. female has a telephone virtual visit with me today for evaluation and discussion of her lab results.  The patient is feeling fine today with no concerning complaints.  She denied having any fatigue or weakness.  She denied having any dizzy spells.  She has no nausea, vomiting, diarrhea or constipation.  She denied having any chest pain, shortness of breath, cough or hemoptysis.  She is tolerating her current treatment with the over-the-counter iron tablets fairly well.  She takes MiraLAX for constipation.  She had repeat CBC, iron study and ferritin as well as myeloma panel performed recently and we are having the visit for evaluation and discussion of her lab results.  She also had skeletal bone survey in  February 2020.  MEDICAL HISTORY: Past Medical History:  Diagnosis Date  . Allergy   . Anemia   . Anxiety   . Depression   . Hyperlipidemia     ALLERGIES:  is allergic to sulfonamide derivatives.  MEDICATIONS:  Current Outpatient Medications  Medication Sig Dispense Refill  . hydrOXYzine (ATARAX/VISTARIL) 25 MG tablet TAKE 0.5-1 TABLETS (12.5-25 MG TOTAL) BY MOUTH DAILY AS NEEDED FOR ANXIETY. 90 tablet 1  . pravastatin (PRAVACHOL) 40 MG tablet TAKE 1 TABLET BY MOUTH EVERY DAY 90 tablet 2  . traZODone (DESYREL) 50 MG tablet TAKE 0.5-1 TABLETS (25-50 MG TOTAL) BY MOUTH AT BEDTIME AS NEEDED FOR SLEEP. 90 tablet 1  . venlafaxine XR (EFFEXOR-XR) 37.5 MG 24 hr capsule TAKE 1 CAPSULE (37.5 MG TOTAL) BY MOUTH DAILY WITH BREAKFAST. 90 capsule 0   No current facility-administered medications for this visit.     SURGICAL HISTORY:  Past Surgical History:  Procedure Laterality Date  . TUBAL LIGATION      REVIEW OF SYSTEMS:  A comprehensive review of systems was negative.   LABORATORY DATA: Lab Results  Component Value Date   WBC 5.5 02/25/2019   HGB 11.1 (L) 02/25/2019   HCT 37.1 02/25/2019   MCV 76.2 (L) 02/25/2019   PLT 288 02/25/2019      Chemistry      Component Value Date/Time   NA 141 02/25/2019 1326   NA 141 04/11/2018 1752   K 4.0 02/25/2019 1326   CL 103 02/25/2019 1326   CO2 27 02/25/2019 1326   BUN 11 02/25/2019 1326   BUN 11 04/11/2018 1752   CREATININE 1.02 (H) 02/25/2019 1326   CREATININE 0.98 09/10/2017  1333      Component Value Date/Time   CALCIUM 9.4 02/25/2019 1326   ALKPHOS 51 02/25/2019 1326   AST 18 02/25/2019 1326   ALT 15 02/25/2019 1326   BILITOT 0.3 02/25/2019 1326       RADIOGRAPHIC STUDIES: No results found.  ASSESSMENT AND PLAN: This is a very pleasant 48 years old African-American female presented for evaluation of microcytic hypochromic anemia likely secondary to beta thalassemia minor as well as iron deficiency.   She was treated  in the past with Feraheme infusion and felt a little bit better. Myeloma panel performed in September showed elevated free kappa light chain concerning for monoclonal gammopathy and will need to rule out any underlying multiple myeloma. The patient underwent a bone marrow biopsy and aspirate recently and that showed increased level of monoclonal plasma cells 5-10% suspicious for plasma cell neoplasm. The patient is currently on observation and she is feeling fine. She had repeat CBC, iron study and ferritin that showed no concerning findings except for mild anemia. Myeloma panel was unremarkable today. Skeletal bone survey was normal. I discussed these results with the patient today and recommended for her to continue on observation with repeat CBC, iron study, ferritin and myeloma panel in 6 months. She was advised to call immediately if she has any concerning symptoms in the interval.  I discussed the assessment and treatment plan with the patient. The patient was provided an opportunity to ask questions and all were answered. The patient agreed with the plan and demonstrated an understanding of the instructions.   The patient was advised to call back or seek an in-person evaluation if the symptoms worsen or if the condition fails to improve as anticipated.  I provided 12 minutes of non face-to-face telephone visit time during this encounter, and > 50% was spent counseling as documented under my assessment & plan.  [name], MD 03/04/2019 8:32 AM  Disclaimer: This note was dictated with voice recognition software. Similar sounding words can inadvertently be transcribed and may not be corrected upon review.

## 2019-03-24 ENCOUNTER — Ambulatory Visit: Payer: BLUE CROSS/BLUE SHIELD | Admitting: Family Medicine

## 2019-05-07 ENCOUNTER — Ambulatory Visit: Payer: BC Managed Care – PPO | Admitting: Podiatry

## 2019-05-07 ENCOUNTER — Ambulatory Visit: Payer: Self-pay | Admitting: *Deleted

## 2019-05-07 ENCOUNTER — Encounter: Payer: Self-pay | Admitting: Podiatry

## 2019-05-07 ENCOUNTER — Other Ambulatory Visit: Payer: Self-pay

## 2019-05-07 VITALS — Temp 97.7°F

## 2019-05-07 DIAGNOSIS — L600 Ingrowing nail: Secondary | ICD-10-CM | POA: Diagnosis not present

## 2019-05-07 DIAGNOSIS — T148XXA Other injury of unspecified body region, initial encounter: Secondary | ICD-10-CM | POA: Diagnosis not present

## 2019-05-07 DIAGNOSIS — M2041 Other hammer toe(s) (acquired), right foot: Secondary | ICD-10-CM | POA: Diagnosis not present

## 2019-05-07 NOTE — Telephone Encounter (Signed)
I have 2 hematomas on both of my big toes.    Last Wednesday I was doing yard work.   I wore Crocs for 2 days.  I usually wear thick socks with them but this time I wore thin socks and now I have dark hematomas under my big toenails on both feet.  It hurts to walk.   They have bled under my big toenails.   Anything that touches my toenails hurts so bad.  It hurts to walk also.   I'm sure this happened from wearing Crocs for 2 days while doing yard work last Wednesday.  She was wondering if Dr. Nolon Rod or another provider there is able to drain the toenail or does she need a podiatrist?  She didn't know if she needed to come in or not.   She did not make an appt until she knew if Dr. Nolon Rod or whoever there could do anything about the blood under her toenails.  I called Primary Care at [facility] to find out if any of the providers drain toenails with blood under them.  They do not however they refer people to [facility].  I called pt back and let her know the above referral to the podiatrist and gave her the name of the practice.   She was very appreciative and is going to call them now.       Reason for Disposition . [1] MODERATE-SEVERE pain AND [2] blood present under the toenail  Answer Assessment - Initial Assessment Questions 1. MECHANISM: "How did the injury happen?"      Wearing Crocs last Wednesday while doing yard work with thin socks on instead of the thick socks I usually wear with my Crocs. 2. ONSET: "When did the injury happen?" (Minutes or hours ago)      Last Wednesday 3. LOCATION: "What part of the toe is injured?" "Is the nail damaged?"      There is dark looking hematomas under both of my toe nails on my big toes. 4. APPEARANCE of TOE INJURY: "What does the injury look like?"      Dark fluid under both toe nails. 5. SEVERITY: "Can you use the foot normally?" "Can you walk?"      Yes but it hurts to walk. 6. SIZE: For cuts, bruises, or swelling, ask: "How large  is it?" (e.g., inches or centimeters;  entire toe)      Under both big toe nails. 7. PAIN: "Is there pain?" If so, ask: "How bad is the pain?"   (e.g., Scale 1-10; or mild, moderate, severe)     Yes.   It really hurts for anything to touch my big toe nails. 8. TETANUS: For any breaks in the skin, ask: "When was the last tetanus booster?"     Not asked 9. DIABETES: "Do you have a history of diabetes or poor circulation in the feet?"     No 10. OTHER SYMPTOMS: "Do you have any other symptoms?"        No 11. PREGNANCY: "Is there any chance you are pregnant?" "When was your last menstrual period?"       Not asked  Protocols used: TOE INJURY-A-AH

## 2019-05-08 ENCOUNTER — Ambulatory Visit: Payer: BC Managed Care – PPO | Admitting: Podiatry

## 2019-05-10 ENCOUNTER — Other Ambulatory Visit: Payer: Self-pay | Admitting: Family Medicine

## 2019-05-10 DIAGNOSIS — F411 Generalized anxiety disorder: Secondary | ICD-10-CM

## 2019-05-10 NOTE — Telephone Encounter (Signed)
Requested Prescriptions  Pending Prescriptions Disp Refills  . venlafaxine XR (EFFEXOR-XR) 37.5 MG 24 hr capsule [Pharmacy Med Name: VENLAFAXINE HCL ER 37.5 MG CAP] 90 capsule 0    Sig: TAKE 1 CAPSULE (37.5 MG TOTAL) BY MOUTH DAILY WITH BREAKFAST.     Psychiatry: Antidepressants - SNRI - desvenlafaxine & venlafaxine Failed - 05/10/2019  9:31 AM      Failed - LDL in normal range and within 360 days    LDL Cholesterol (Calc)  Date Value Ref Range Status  09/10/2017 195 (H) mg/dL (calc) Final    Comment:    LDL-C levels > or = 190 mg/dL may indicate familial  hypercholesterolemia (FH). Clinical assessment and  measurement of blood lipid levels should be  considered for all first degree relatives of  patients with an FH diagnosis.  For questions about testing for familial hypercholesterolemia, please call Insurance risk surveyor at [email or web address]. Duncan Dull, et al. J National Lipid Association  Recommendations for Patient-Centered Management of  Dyslipidemia: Part 1 Journal of Clinical Lipidology  2015;9(2), 129-169. Reference range: <100 . Desirable range <100 mg/dL for primary prevention;   <70 mg/dL for patients with CHD or diabetic patients  with > or = 2 CHD risk factors. Marland Kitchen LDL-C is now calculated using the Martin-Hopkins  calculation, which is a validated novel method providing  better accuracy than the Friedewald equation in the  estimation of LDL-C.  Cresenciano Genre et al. Annamaria Helling. 0347;425(95): 2061-2068  (http://education.QuestDiagnostics.com/faq/FAQ164)    LDL Calculated  Date Value Ref Range Status  04/11/2018 129 (H) 0 - 99 mg/dL Final         Failed - Total Cholesterol in normal range and within 360 days    Cholesterol, Total  Date Value Ref Range Status  04/11/2018 231 (H) 100 - 199 mg/dL Final         Failed - Triglycerides in normal range and within 360 days    Triglycerides  Date Value Ref Range Status  04/11/2018 38 0 - 149 mg/dL Final        Failed - Valid encounter within last 6 months    Recent Outpatient Visits          7 months ago ANXIETY DISORDER, GENERALIZED   [facility] at [place], [place] D, PA-C   8 months ago [place], GENERALIZED   [facility] [place], [place] D, PA-C   1 year ago Iron deficiency anemia due to chronic blood loss   Primary Care at [facility], Arlie Solomons, MD   1 year ago Heart palpitations   Primary Care at [facility], Arlie Solomons, MD   1 year ago Annual physical exam   Primary Care at [facility], Laurey Arrow, MD      Future Appointments            In 1 month [name], MD Primary Care at [place], [place] BP in normal range    BP Readings from Last 1 Encounters:  12/03/18 137/79         Passed - Completed PHQ-2 or PHQ-9 in the last 360 days.

## 2019-05-11 NOTE — Progress Notes (Signed)
Subjective:   Patient ID: Jessica [name], female   DOB: 48 y.o.   MRN: [id number]   HPI Patient presents stating that the big toenails of both feet have been very tender and that it happened after doing yard work and there is been discoloration.  Patient does not smoke likes to be active   Review of Systems  All other systems reviewed and are negative.       Objective:  Physical Exam Vitals signs and nursing note reviewed.  Constitutional:      Appearance: She is well-developed.  Pulmonary:     Effort: Pulmonary effort is normal.  Musculoskeletal: Normal range of motion.  Skin:    General: Skin is warm.  Neurological:     Mental Status: She is alert.     Neurovascular status was found to be intact muscle strength was adequate range of motion within normal limits.  Patient is found to have discoloration of the hallux nails of both feet distal two thirds with moderate discomfort with palpation but no drainage noted or looseness of the nailbeds     Assessment:  Probability for hematoma bilateral hallux with ingrown component with probable damage but no current drainage noted     Plan:  8 should be educated on condition and discussed the considerations for trying to drill it or remove that but at this time get a try soaks padding and I discussed ultimately it may require a surgical procedure

## 2019-05-30 ENCOUNTER — Telehealth: Payer: Self-pay | Admitting: Family Medicine

## 2019-05-30 NOTE — Telephone Encounter (Signed)
Medication Refill - Medication: pravastatin (PRAVACHOL) 40 MG tablet    Has the patient contacted their pharmacy? Yes.  Pt states the pharmacy has tried to contact office. Please advise.  (Agent: If no, request that the patient contact the pharmacy for the refill.) (Agent: If yes, when and what did the pharmacy advise?)  Preferred Pharmacy (with phone number or street name):  [id number] [address], [place]  [place] [address]  Phone: [phone] Fax: [phone]  Not a 24 hour pharmacy; exact hours not known.     Agent: Please be advised that RX refills may take up to 3 business days. We ask that you follow-up with your pharmacy.

## 2019-06-03 ENCOUNTER — Other Ambulatory Visit: Payer: Self-pay

## 2019-06-03 MED ORDER — PRAVASTATIN SODIUM 40 MG PO TABS: 40.0000 mg | ORAL_TABLET | Freq: Every day | ORAL | 0 refills | Status: DC

## 2019-06-03 NOTE — Telephone Encounter (Signed)
Rx sent to pharmacy   

## 2019-06-16 ENCOUNTER — Ambulatory Visit (INDEPENDENT_AMBULATORY_CARE_PROVIDER_SITE_OTHER): Payer: BC Managed Care – PPO | Admitting: Family Medicine

## 2019-06-16 ENCOUNTER — Other Ambulatory Visit (HOSPITAL_COMMUNITY)
Admission: RE | Admit: 2019-06-16 | Discharge: 2019-06-16 | Disposition: A | Discharge status: Home / Self Care | Payer: BC Managed Care – PPO | Source: Ambulatory Visit | Attending: Family Medicine | Admitting: Family Medicine

## 2019-06-16 ENCOUNTER — Other Ambulatory Visit: Payer: Self-pay

## 2019-06-16 ENCOUNTER — Encounter: Payer: Self-pay | Admitting: Family Medicine

## 2019-06-16 VITALS — BP 148/79 | HR 85 | Temp 98.5°F | Resp 17 | Ht 67.0 in | Wt 156.0 lb

## 2019-06-16 DIAGNOSIS — N926 Irregular menstruation, unspecified: Secondary | ICD-10-CM

## 2019-06-16 DIAGNOSIS — Z124 Encounter for screening for malignant neoplasm of cervix: Secondary | ICD-10-CM | POA: Diagnosis not present

## 2019-06-16 DIAGNOSIS — E559 Vitamin D deficiency, unspecified: Secondary | ICD-10-CM | POA: Diagnosis not present

## 2019-06-16 DIAGNOSIS — Z1151 Encounter for screening for human papillomavirus (HPV): Secondary | ICD-10-CM

## 2019-06-16 DIAGNOSIS — Z131 Encounter for screening for diabetes mellitus: Secondary | ICD-10-CM

## 2019-06-16 DIAGNOSIS — E7800 Pure hypercholesterolemia, unspecified: Secondary | ICD-10-CM

## 2019-06-16 DIAGNOSIS — Z0001 Encounter for general adult medical examination with abnormal findings: Secondary | ICD-10-CM

## 2019-06-16 DIAGNOSIS — Z113 Encounter for screening for infections with a predominantly sexual mode of transmission: Secondary | ICD-10-CM

## 2019-06-16 DIAGNOSIS — Z0000 Encounter for general adult medical examination without abnormal findings: Secondary | ICD-10-CM

## 2019-06-16 DIAGNOSIS — N951 Menopausal and female climacteric states: Secondary | ICD-10-CM

## 2019-06-16 DIAGNOSIS — D500 Iron deficiency anemia secondary to blood loss (chronic): Secondary | ICD-10-CM

## 2019-06-16 NOTE — Patient Instructions (Addendum)
Weaning Off Venlafaxine  Take venlafaxine every other day for a week Then during week 2 take venlafaxine only twice a week Week 3 stop venlafaxine   If you have lab work done today you will be contacted with your lab results within the next 2 weeks.  If you have not heard from [place] then please contact us. The fastest way to get your results is to register for My Chart.   IF you received an x-ray today, you will receive an invoice from [facility]. Please contact [facility] at [phone] with questions or concerns regarding your invoice.   IF you received labwork today, you will receive an invoice from [facility]. Please contact [facility] at [phone] with questions or concerns regarding your invoice.   Our billing staff will not be able to assist you with questions regarding bills from these companies.  You will be contacted with the lab results as soon as they are available. The fastest way to get your results is to activate your My Chart account. Instructions are located on the last page of this paperwork. If you have not heard from [place] regarding the results in 2 weeks, please contact this office.     Perimenopause  Perimenopause is the normal time of life before and after menstrual periods stop completely (menopause). Perimenopause can begin 2-8 years before menopause, and it usually lasts for 1 year after menopause. During perimenopause, the ovaries may or may not produce an egg. What are the causes? This condition is caused by a natural change in hormone levels that happens as you get older. What increases the risk? This condition is more likely to start at an earlier age if you have certain medical conditions or treatments, including:  A tumor of the pituitary gland in the brain.  A disease that affects the ovaries and hormone production.  Radiation treatment for cancer.  Certain cancer treatments, such as chemotherapy or hormone (anti-estrogen)  therapy.  Heavy smoking and excessive alcohol use.  Family history of early menopause. What are the signs or symptoms? Perimenopausal changes affect each woman differently. Symptoms of this condition may include:  Hot flashes.  Night sweats.  Irregular menstrual periods.  Decreased sex drive.  Vaginal dryness.  Headaches.  Mood swings.  Depression.  Memory problems or trouble concentrating.  Irritability.  Tiredness.  Weight gain.  Anxiety.  Trouble getting pregnant. How is this diagnosed? This condition is diagnosed based on your medical history, a physical exam, your age, your menstrual history, and your symptoms. Hormone tests may also be done. How is this treated? In some cases, no treatment is needed. You and your health care provider should make a decision together about whether treatment is necessary. Treatment will be based on your individual condition and preferences. Various treatments are available, such as:  Menopausal hormone therapy (MHT).  Medicines to treat specific symptoms.  Acupuncture.  Vitamin or herbal supplements. Before starting treatment, make sure to let your health care provider know if you have a personal or family history of:  Heart disease.  Breast cancer.  Blood clots.  Diabetes.  Osteoporosis. Follow these instructions at home: Lifestyle  Do not use any products that contain nicotine or tobacco, such as cigarettes and e-cigarettes. If you need help quitting, ask your health care provider.  Eat a balanced diet that includes fresh fruits and vegetables, whole grains, soybeans, eggs, lean meat, and low-fat dairy.  Get at least 30 minutes of physical activity on 5 or more days each week.  Avoid alcoholic and caffeinated beverages, as well as spicy foods. This may help prevent hot flashes.  Get 7-8 hours of sleep each night.  Dress in layers that can be removed to help you manage hot flashes.  Find ways to manage  stress, such as deep breathing, meditation, or journaling. General instructions  Keep track of your menstrual periods, including: ? When they occur. ? How heavy they are and how long they last. ? How much time passes between periods.  Keep track of your symptoms, noting when they start, how often you have them, and how long they last.  Take over-the-counter and prescription medicines only as told by your health care provider.  Take vitamin supplements only as told by your health care provider. These may include calcium, vitamin E, and vitamin D.  Use vaginal lubricants or moisturizers to help with vaginal dryness and improve comfort during sex.  Talk with your health care provider before starting any herbal supplements.  Keep all follow-up visits as told by your health care provider. This is important. This includes any group therapy or counseling. Contact a health care provider if:  You have heavy vaginal bleeding or pass blood clots.  Your period lasts more than 2 days longer than normal.  Your periods are recurring sooner than 21 days.  You bleed after having sex. Get help right away if:  You have chest pain, trouble breathing, or trouble talking.  You have severe depression.  You have pain when you urinate.  You have severe headaches.  You have vision problems. Summary  Perimenopause is the time when a woman's body begins to move into menopause. This may happen naturally or as a result of other health problems or medical treatments.  Perimenopause can begin 2-8 years before menopause, and it usually lasts for 1 year after menopause.  Perimenopausal symptoms can be managed through medicines, lifestyle changes, and complementary therapies such as acupuncture. This information is not intended to replace advice given to you by your health care provider. Make sure you discuss any questions you have with your health care provider. Document Released: 12/07/2004 Document  Revised: 10/12/2017 Document Reviewed: 12/05/2016 Elsevier Patient Education  2020 [employer].

## 2019-06-17 ENCOUNTER — Encounter: Payer: Self-pay | Admitting: Family Medicine

## 2019-06-17 LAB — HIV ANTIBODY (ROUTINE TESTING W REFLEX): HIV 1&2 Ab, 4th Generation: NONREACTIVE

## 2019-06-17 LAB — CYTOLOGY - PAP
Chlamydia: NEGATIVE
Diagnosis: NEGATIVE
HPV: NOT DETECTED
Neisseria Gonorrhea: NEGATIVE
Trichomonas: NEGATIVE

## 2019-06-17 LAB — TSH: TSH: 1.58 mIU/L

## 2019-06-17 LAB — LIPID PANEL
Cholesterol: 265 mg/dL — ABNORMAL HIGH (ref <200)
HDL: 70 mg/dL (ref >=50)
LDL Cholesterol (Calc): 175 mg/dL (calc) — ABNORMAL HIGH
Non-HDL Cholesterol (Calc): 195 mg/dL (calc) — ABNORMAL HIGH (ref <130)
Total CHOL/HDL Ratio: 3.8 (calc) (ref <5.0)
Triglycerides: 90 mg/dL (ref <150)

## 2019-06-17 LAB — RPR: RPR Ser Ql: NONREACTIVE

## 2019-06-17 LAB — HEPATITIS B SURFACE ANTIGEN: Hepatitis B Surface Ag: NONREACTIVE

## 2019-06-17 LAB — HEMOGLOBIN A1C
Hgb A1c MFr Bld: 5.4 % of total Hgb (ref <5.7)
Mean Plasma Glucose: 108 (calc)
eAG (mmol/L): 6 (calc)

## 2019-06-17 LAB — VITAMIN D 25 HYDROXY (VIT D DEFICIENCY, FRACTURES): Vit D, 25-Hydroxy: 31 ng/mL (ref 30–100)

## 2019-06-17 MED ORDER — ATORVASTATIN CALCIUM 20 MG PO TABS: 20.0000 mg | ORAL_TABLET | Freq: Every day | ORAL | 3 refills | Status: DC

## 2019-06-17 NOTE — Progress Notes (Signed)
Chief Complaint  Patient presents with  . Annual Exam    cpe with pap  . Medication Refill    pravastatin, trazodone, venlafaxine    Subjective:  Jessica [name] is a 48 y.o. female here for a health maintenance visit.  Patient is established pt    Patient Active Problem List   Diagnosis Date Noted  . MGUS (monoclonal gammopathy of unknown significance) 11/11/2018  . Beta thalassemia minor 08/12/2018  . Obsessive compulsive disorder 02/18/2014  . Major depressive disorder, recurrent episode, moderate ([place]) 05/14/2013  . Major depressive disorder, recurrent episode, severe, without mention of psychotic behavior 03/05/2013  . Major depressive disorder, recurrent episode, in full remission ([place]) 12/04/2012  . Social anxiety disorder 09/17/2012  . ANXIETY DISORDER, GENERALIZED 07/15/2008  . HYPERCHOLESTEROLEMIA 10/07/2007  . VITILIGO 10/07/2007  . ANEMIA, IRON DEFICIENCY, UNSPEC. 01/10/2007    Past Medical History:  Diagnosis Date  . Allergy   . Anemia   . Anxiety   . Depression   . Hyperlipidemia     Past Surgical History:  Procedure Laterality Date  . TUBAL LIGATION       Outpatient Medications Prior to Visit  Medication Sig Dispense Refill  . pravastatin (PRAVACHOL) 40 MG tablet Take 1 tablet (40 mg total) by mouth daily. 30 tablet 0  . traZODone (DESYREL) 50 MG tablet TAKE 0.5-1 TABLETS (25-50 MG TOTAL) BY MOUTH AT BEDTIME AS NEEDED FOR SLEEP. 90 tablet 1  . venlafaxine XR (EFFEXOR-XR) 37.5 MG 24 hr capsule TAKE 1 CAPSULE (37.5 MG TOTAL) BY MOUTH DAILY WITH BREAKFAST. 90 capsule 0  . hydrOXYzine (ATARAX/VISTARIL) 25 MG tablet TAKE 0.5-1 TABLETS (12.5-25 MG TOTAL) BY MOUTH DAILY AS NEEDED FOR ANXIETY. 90 tablet 1   No facility-administered medications prior to visit.     Allergies  Allergen Reactions  . Sulfonamide Derivatives     REACTION: hives     Family History  Problem Relation Age of Onset  . Alcohol abuse Father   . Drug abuse Father   .  Hyperlipidemia Father   . Hypertension Father   . Alcohol abuse Maternal Uncle   . Heart disease Mother   . Hyperlipidemia Mother   . Hypertension Mother   . Heart disease Maternal Grandmother   . Hypertension Maternal Grandmother   . Hyperlipidemia Maternal Grandmother   . Cancer Paternal Grandmother   . Diabetes Paternal Grandmother   . Hypertension Paternal Grandmother   . Cancer Paternal Grandfather   . Breast cancer Neg Hx      Health Habits: Dental Exam: up to date Eye Exam: up to date Exercise: 0 times/week on average Current exercise activities: walking/running Diet: balanced  Social History   Socioeconomic History  . Marital status: Married    Spouse name: Not on file  . Number of children: Not on file  . Years of education: Not on file  . Highest education level: Not on file  Occupational History  . Not on file  Social Needs  . Financial resource strain: Not on file  . Food insecurity    Worry: Not on file    Inability: Not on file  . Transportation needs    Medical: Not on file    Non-medical: Not on file  Tobacco Use  . Smoking status: Never Smoker  . Smokeless tobacco: Never Used  Substance and Sexual Activity  . Alcohol use: No    Alcohol/week: 0.0 standard drinks  . Drug use: No  . Sexual activity: Yes  Lifestyle  .  Physical activity    Days per week: Not on file    Minutes per session: Not on file  . Stress: Not on file  Relationships  . Social Herbalist on phone: Not on file    Gets together: Not on file    Attends religious service: Not on file    Active member of club or organization: Not on file    Attends meetings of clubs or organizations: Not on file    Relationship status: Not on file  . Intimate partner violence    Fear of current or ex partner: Not on file    Emotionally abused: Not on file    Physically abused: Not on file    Forced sexual activity: Not on file  Other Topics Concern  . Not on file  Social  History Narrative  . Not on file   Social History   Substance and Sexual Activity  Alcohol Use No  . Alcohol/week: 0.0 standard drinks   Social History   Tobacco Use  Smoking Status Never Smoker  Smokeless Tobacco Never Used   Social History   Substance and Sexual Activity  Drug Use No    GYN: Sexual Health Menstrual status: regular menses LMP: Patient's last menstrual period was 05/29/2019. Last pap smear: see HM section History of abnormal pap smears:  Sexually active:  with female partner Current contraception: menopause  Health Maintenance: See under health Maintenance activity for review of completion dates as well. Immunization History  Administered Date(s) Administered  . Influenza Split 07/28/2012  . Influenza,inj,Quad PF,6+ Mos 08/09/2013, 08/26/2014, 07/30/2015, 08/31/2016, 09/10/2017, 08/04/2018  . Influenza-Unspecified 08/04/2018  . Td 04/13/2002  . Tdap 06/25/2014      Depression Screen-PHQ2/9 Depression screen [facility] 2/9 06/16/2019 09/23/2018 05/01/2018 04/11/2018 04/11/2018  Decreased Interest 0 0 0 2 0  Down, Depressed, Hopeless 0 0 0 2 0  PHQ - 2 Score 0 0 0 4 0  Altered sleeping - - - 3 -  Tired, decreased energy - - - 1 -  Change in appetite - - - 0 -  Feeling bad or failure about yourself  - - - 2 -  Trouble concentrating - - - 2 -  Moving slowly or fidgety/restless - - - 0 -  Suicidal thoughts - - - 0 -  PHQ-9 Score - - - 12 -  Difficult doing work/chores - - - Somewhat difficult -       Depression Severity and Treatment Recommendations:  0-4= None  5-9= Mild / Treatment: Support, educate to call if worse; return in one month  10-14= Moderate / Treatment: Support, watchful waiting; Antidepressant or Psycotherapy  15-19= Moderately severe / Treatment: Antidepressant OR Psychotherapy  >= 20 = Major depression, severe / Antidepressant AND Psychotherapy    Review of Systems   ROS   Review of Systems  Constitutional: Negative for  activity change, appetite change, chills and fever.  HENT: Negative for congestion, nosebleeds, trouble swallowing and voice change.   Respiratory: Negative for cough, shortness of breath and wheezing.   Gastrointestinal: Negative for diarrhea, nausea and vomiting.  Genitourinary: Negative for difficulty urinating, dysuria, flank pain and hematuria.  Musculoskeletal: Negative for back pain, joint swelling and neck pain.  Neurological: Negative for dizziness, speech difficulty, light-headedness and numbness.    Objective:   Vitals:   06/16/19 1044  BP: (!) 148/79  Pulse: 85  Resp: 17  Temp: 98.5 F (36.9 C)  TempSrc: Oral  SpO2: 100%  Weight:  156 lb (70.8 kg)  Height: 5\' 7"  (1.702 m)    Body mass index is 24.43 kg/m.  Physical Exam Constitutional:      Appearance: Normal appearance. She is normal weight.  HENT:     Head: Normocephalic and atraumatic.  Eyes:     Extraocular Movements: Extraocular movements intact.     Conjunctiva/sclera: Conjunctivae normal.  Neck:     Musculoskeletal: Normal range of motion and neck supple.     Comments: No thyromegaly Cardiovascular:     Rate and Rhythm: Normal rate and regular rhythm.     Pulses: Normal pulses.  Pulmonary:     Effort: Pulmonary effort is normal. No respiratory distress.     Breath sounds: Normal breath sounds. No stridor. No wheezing, rhonchi or rales.  Chest:     Chest wall: No tenderness.     Breasts:        Right: Normal. No swelling, bleeding, inverted nipple, mass, nipple discharge, skin change or tenderness.        Left: Normal. No swelling, bleeding, inverted nipple, mass, nipple discharge, skin change or tenderness.  Abdominal:     General: Abdomen is flat. Bowel sounds are normal. There is no distension.     Palpations: Abdomen is soft. There is no mass.     Tenderness: There is no abdominal tenderness. There is no right CVA tenderness, left CVA tenderness, guarding or rebound.     Hernia: No hernia is  present.  Musculoskeletal: Normal range of motion.        General: No swelling or tenderness.  Lymphadenopathy:     Cervical: No cervical adenopathy.  Skin:    General: Skin is warm.     Capillary Refill: Capillary refill takes less than 2 seconds.     Coloration: Skin is not jaundiced.     Findings: No erythema or rash.  Neurological:     General: No focal deficit present.     Mental Status: She is alert and oriented to person, place, and time.     Cranial Nerves: No cranial nerve deficit.  Psychiatric:        Mood and Affect: Mood normal.        Behavior: Behavior normal.        Thought Content: Thought content normal.        Judgment: Judgment normal.    Vaginal exam- Chaperone Present Labia normal bilaterally without skin lesions Urethral meatus normal appearing without erythema Vagina without discharge No CMT, ovaries small and not palpable Uterus midline, nontender Pap smear performed   Assessment/Plan:   Patient was seen for a health maintenance exam.  Counseled the patient on health maintenance issues. Reviewed her health mainteance schedule and ordered appropriate tests (see orders.) Counseled on regular exercise and weight management. Recommend regular eye exams and dental cleaning.   The following issues were addressed today for health maintenance:   Jessica [name] was seen today for annual exam and medication refill.  Diagnoses and all orders for this visit:  Encounter for health maintenance examination in adult-  Discussed health screening for women Discussed eye exam and dental visit Exercise often  Screening for malignant neoplasm of cervix -     Cytology - PAP([place])  Screening for human papillomavirus (HPV) -     Cytology - PAP([place])  Screen for STD (sexually transmitted disease) -     Cytology - PAP([place]) -     HIV Antibody (routine testing w rflx) -  Hepatitis B surface antigen -     RPR  HYPERCHOLESTEROLEMIA -     Lipid panel  -     TSH  Vitamin D deficiency -     VITAMIN D 25 Hydroxy (Vit-D Deficiency, Fractures)  Screening for diabetes mellitus -     Hemoglobin A1c  Anemia due to chronic blood loss -     Ambulatory referral to Obstetrics / Gynecology  Perimenopause -     Ambulatory referral to Obstetrics / Gynecology  Irregular menses -     Ambulatory referral to Obstetrics / Gynecology   Follow up with Ob/Gyn to discuss menses and how to stop her menses since there is a concern about her chronic blood loss anemia options such as ablation.  Discussed diet and exercise  Discussed vitamin D deficiency and proper supplementation  Patient will wean off venlafaxine   Return in about 1 year (around 06/15/2020).    Body mass index is 24.43 kg/m.:  Discussed the patient's BMI with patient. The BMI body mass index is 24.43 kg/m.     Future Appointments  Date Time Provider [place]  09/02/2019  2:00 PM [facility] 1 [facility] None  09/09/2019  8:30 AM Curt Bears, MD [facility] None    Patient Instructions   Weaning Off Venlafaxine  Take venlafaxine every other day for a week Then during week 2 take venlafaxine only twice a week Week 3 stop venlafaxine   If you have lab work done today you will be contacted with your lab results within the next 2 weeks.  If you have not heard from [place] then please contact us. The fastest way to get your results is to register for My Chart.   IF you received an x-ray today, you will receive an invoice from [facility]. Please contact [facility] at [phone] with questions or concerns regarding your invoice.   IF you received labwork today, you will receive an invoice from [facility]. Please contact [facility] at [phone] with questions or concerns regarding your invoice.   Our billing staff will not be able to assist you with questions regarding bills from these companies.  You will be contacted with the lab results  as soon as they are available. The fastest way to get your results is to activate your My Chart account. Instructions are located on the last page of this paperwork. If you have not heard from [place] regarding the results in 2 weeks, please contact this office.     Perimenopause  Perimenopause is the normal time of life before and after menstrual periods stop completely (menopause). Perimenopause can begin 2-8 years before menopause, and it usually lasts for 1 year after menopause. During perimenopause, the ovaries may or may not produce an egg. What are the causes? This condition is caused by a natural change in hormone levels that happens as you get older. What increases the risk? This condition is more likely to start at an earlier age if you have certain medical conditions or treatments, including:  A tumor of the pituitary gland in the brain.  A disease that affects the ovaries and hormone production.  Radiation treatment for cancer.  Certain cancer treatments, such as chemotherapy or hormone (anti-estrogen) therapy.  Heavy smoking and excessive alcohol use.  Family history of early menopause. What are the signs or symptoms? Perimenopausal changes affect each woman differently. Symptoms of this condition may include:  Hot flashes.  Night sweats.  Irregular menstrual periods.  Decreased sex drive.  Vaginal  dryness.  Headaches.  Mood swings.  Depression.  Memory problems or trouble concentrating.  Irritability.  Tiredness.  Weight gain.  Anxiety.  Trouble getting pregnant. How is this diagnosed? This condition is diagnosed based on your medical history, a physical exam, your age, your menstrual history, and your symptoms. Hormone tests may also be done. How is this treated? In some cases, no treatment is needed. You and your health care provider should make a decision together about whether treatment is necessary. Treatment will be based on your individual  condition and preferences. Various treatments are available, such as:  Menopausal hormone therapy (MHT).  Medicines to treat specific symptoms.  Acupuncture.  Vitamin or herbal supplements. Before starting treatment, make sure to let your health care provider know if you have a personal or family history of:  Heart disease.  Breast cancer.  Blood clots.  Diabetes.  Osteoporosis. Follow these instructions at home: Lifestyle  Do not use any products that contain nicotine or tobacco, such as cigarettes and e-cigarettes. If you need help quitting, ask your health care provider.  Eat a balanced diet that includes fresh fruits and vegetables, whole grains, soybeans, eggs, lean meat, and low-fat dairy.  Get at least 30 minutes of physical activity on 5 or more days each week.  Avoid alcoholic and caffeinated beverages, as well as spicy foods. This may help prevent hot flashes.  Get 7-8 hours of sleep each night.  Dress in layers that can be removed to help you manage hot flashes.  Find ways to manage stress, such as deep breathing, meditation, or journaling. General instructions  Keep track of your menstrual periods, including: ? When they occur. ? How heavy they are and how long they last. ? How much time passes between periods.  Keep track of your symptoms, noting when they start, how often you have them, and how long they last.  Take over-the-counter and prescription medicines only as told by your health care provider.  Take vitamin supplements only as told by your health care provider. These may include calcium, vitamin E, and vitamin D.  Use vaginal lubricants or moisturizers to help with vaginal dryness and improve comfort during sex.  Talk with your health care provider before starting any herbal supplements.  Keep all follow-up visits as told by your health care provider. This is important. This includes any group therapy or counseling. Contact a health care  provider if:  You have heavy vaginal bleeding or pass blood clots.  Your period lasts more than 2 days longer than normal.  Your periods are recurring sooner than 21 days.  You bleed after having sex. Get help right away if:  You have chest pain, trouble breathing, or trouble talking.  You have severe depression.  You have pain when you urinate.  You have severe headaches.  You have vision problems. Summary  Perimenopause is the time when a woman's body begins to move into menopause. This may happen naturally or as a result of other health problems or medical treatments.  Perimenopause can begin 2-8 years before menopause, and it usually lasts for 1 year after menopause.  Perimenopausal symptoms can be managed through medicines, lifestyle changes, and complementary therapies such as acupuncture. This information is not intended to replace advice given to you by your health care provider. Make sure you discuss any questions you have with your health care provider. Document Released: 12/07/2004 Document Revised: 10/12/2017 Document Reviewed: 12/05/2016 Elsevier Patient Education  2020 [employer].

## 2019-07-08 IMAGING — US ULTRASOUND LEFT BREAST LIMITED
1 series · 3 of 3 positions shown · non-contrast
Comparison: Previous exam(s).

CLINICAL DATA: Recall from 2D screening mammography, possible
asymmetry involving the UPPER LEFT breast at POSTERIOR depth.

EXAM:
DIGITAL DIAGNOSTIC LEFT MAMMOGRAM WITH CAD AND TOMO
ULTRASOUND LEFT BREAST

[Series 1: ultrasound left breast limited · 0.06mm/px · 3 of 3 slices shown]
[im 1/3]
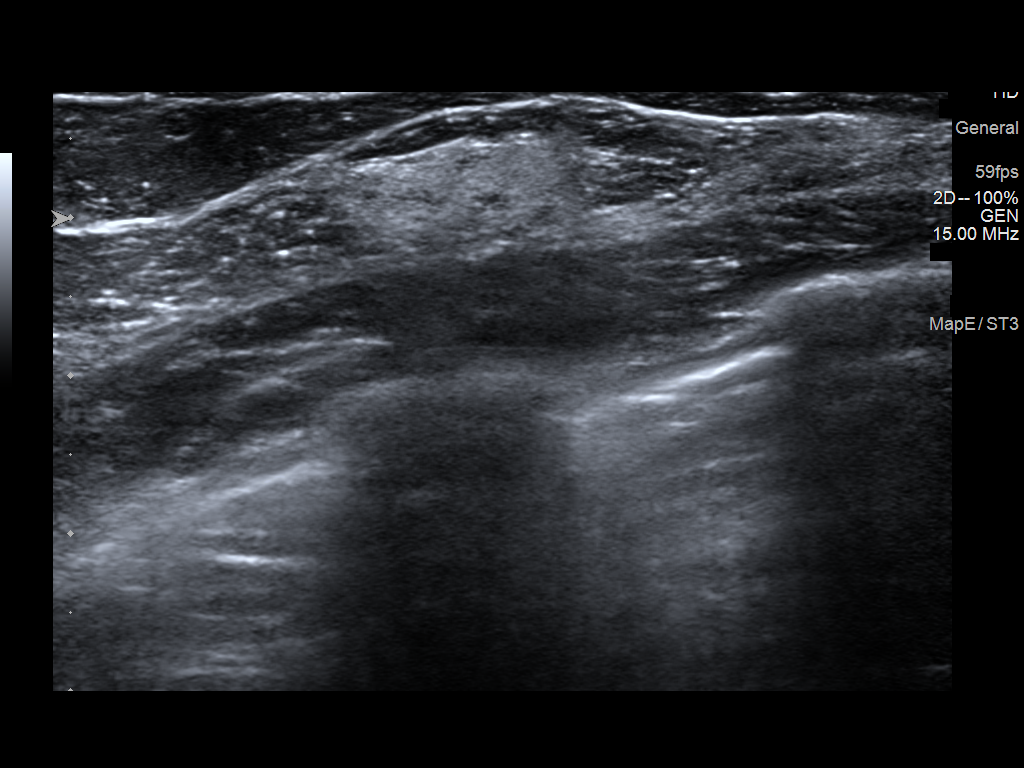
[im 2/3]
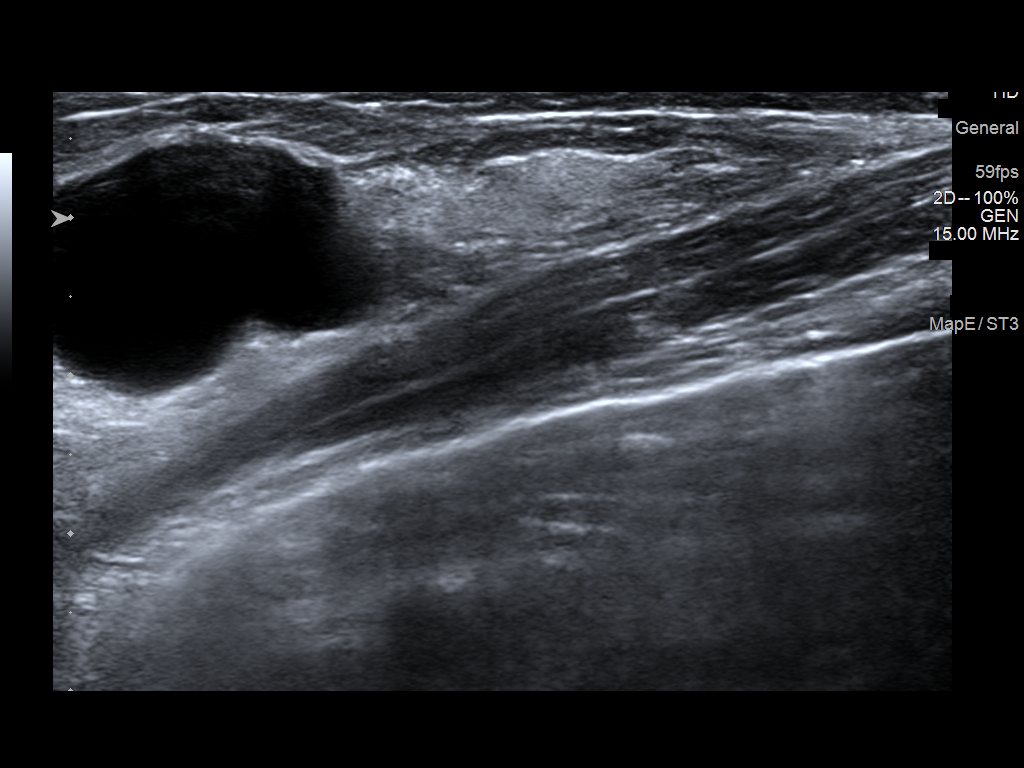
[im 3/3]
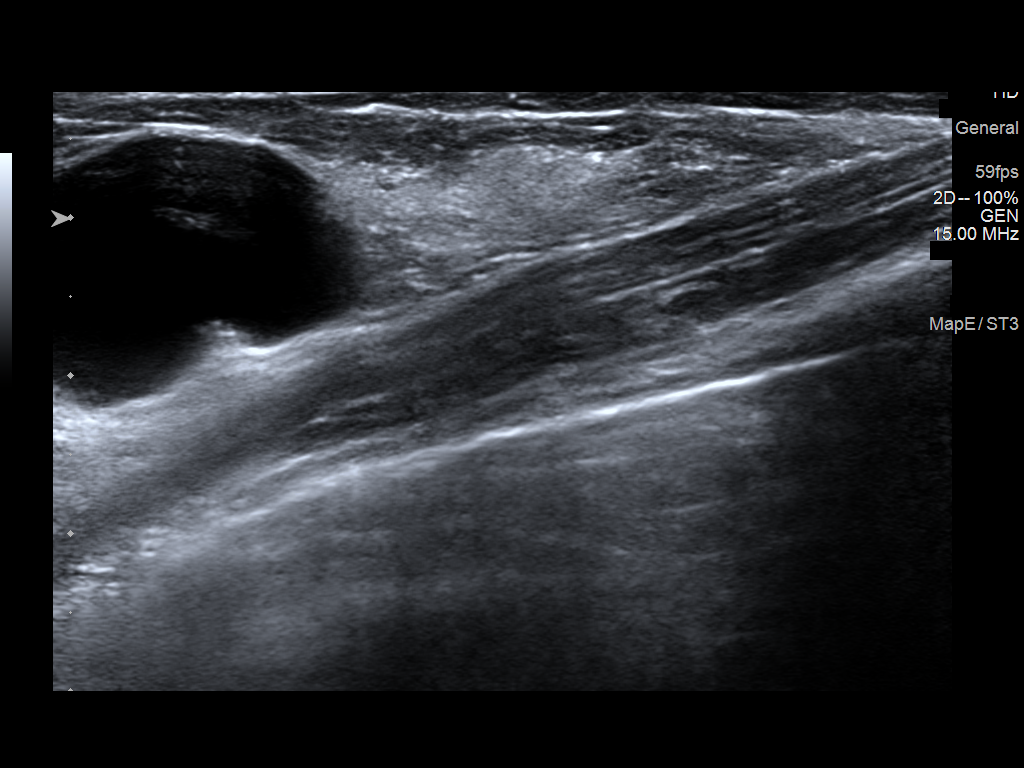

[3 of 3 positions shown; findings below may reference images not displayed]

ACR Breast Density Category c: The breast tissue is heterogeneously
dense, which may obscure small masses.
FINDINGS: Tomosynthesis and synthesized full field CC, laterally exaggerated
CC and MLO views of the LEFT breast were obtained. Tomosynthesis and
synthesized spot-compression CC and MLO views of the area of concern
in the LEFT breast were also obtained.

Initial full field tomosynthesis images demonstrate a persistent
focal asymmetry in the UPPER LEFT breast at POSTERIOR depth which
localizes to the OUTER breast on the laterally exaggerated CC view.
This asymmetry is located superolateral to the benign appearing
circumscribed mass in the UPPER OUTER QUADRANT which is stable over
multiple prior examinations and was previously shown to represent a
cyst.

Multiple benign appearing circumscribed low-density masses are
present elsewhere in the LEFT breast.

Mammographic images were processed with CAD.

Targeted LEFT breast ultrasound is performed, showing an isolated
island of fibroglandular tissue at the 1:30 o'clock position
approximately 5 cm from the nipple adjacent to the benign simple
cyst identified on mammography and prior ultrasounds. No suspicious
solid mass or abnormal acoustic shadowing is identified.
IMPRESSION: 1. No mammographic or sonographic evidence of malignancy involving
the LEFT breast.
2. An island of fibroglandular tissue in the UPPER OUTER QUADRANT of
the LEFT breast at far POSTERIOR depth accounts for the screening
mammographic finding. This is adjacent to a benign simple cyst.

RECOMMENDATION:
Screening mammogram in one year.(Code:XB-P-V5P)

I have discussed the findings and recommendations with the patient.
Results were also provided in writing at the conclusion of the
visit. If applicable, a reminder letter will be sent to the patient
regarding the next appointment.

BI-RADS CATEGORY  2: Benign.

## 2019-07-14 ENCOUNTER — Other Ambulatory Visit: Payer: Self-pay | Admitting: Family Medicine

## 2019-08-03 ENCOUNTER — Other Ambulatory Visit: Payer: Self-pay | Admitting: Family Medicine

## 2019-08-03 DIAGNOSIS — F411 Generalized anxiety disorder: Secondary | ICD-10-CM

## 2019-08-05 DIAGNOSIS — F410 Panic disorder [episodic paroxysmal anxiety] without agoraphobia: Secondary | ICD-10-CM | POA: Diagnosis not present

## 2019-08-05 DIAGNOSIS — D472 Monoclonal gammopathy: Secondary | ICD-10-CM | POA: Diagnosis not present

## 2019-08-05 DIAGNOSIS — R52 Pain, unspecified: Secondary | ICD-10-CM | POA: Diagnosis not present

## 2019-08-05 DIAGNOSIS — R61 Generalized hyperhidrosis: Secondary | ICD-10-CM | POA: Diagnosis not present

## 2019-08-12 NOTE — Progress Notes (Signed)
Transthoracic echocardiogram study report: 1. Left ventricle cavity is normal in size with normal global wall motion.  Visual EF is 55-60 %. 2. Left atrial cavity is slightly dilated. 3. Structurally grossly normal lmitral valve with mild (Grade 1) regurgitation. 4. Structurally normal tricuspid valve with mild regurgitation. 5. Structurally normal pulmonic valve with mild regurgitation.

## 2019-09-02 ENCOUNTER — Inpatient Hospital Stay: Payer: BC Managed Care – PPO | Attending: Internal Medicine

## 2019-09-02 ENCOUNTER — Other Ambulatory Visit: Payer: Self-pay

## 2019-09-02 DIAGNOSIS — D563 Thalassemia minor: Secondary | ICD-10-CM | POA: Diagnosis not present

## 2019-09-02 DIAGNOSIS — D500 Iron deficiency anemia secondary to blood loss (chronic): Secondary | ICD-10-CM

## 2019-09-02 DIAGNOSIS — D509 Iron deficiency anemia, unspecified: Secondary | ICD-10-CM | POA: Diagnosis not present

## 2019-09-02 DIAGNOSIS — D472 Monoclonal gammopathy: Secondary | ICD-10-CM

## 2019-09-02 LAB — CMP (CANCER CENTER ONLY)
ALT: 8 U/L (ref 0–44)
AST: 13 U/L — ABNORMAL LOW (ref 15–41)
Albumin: 4.3 g/dL (ref 3.5–5.0)
Alkaline Phosphatase: 54 U/L (ref 38–126)
Anion gap: 12 (ref 5–15)
BUN: 11 mg/dL (ref 6–20)
CO2: 24 mmol/L (ref 22–32)
Calcium: 9.8 mg/dL (ref 8.9–10.3)
Chloride: 104 mmol/L (ref 98–111)
Creatinine: 1.06 mg/dL — ABNORMAL HIGH (ref 0.44–1.00)
GFR, Est AFR Am: >60 mL/min (ref >60)
GFR, Estimated: >60 mL/min (ref >60)
Glucose, Bld: 96 mg/dL (ref 70–99)
Potassium: 4.1 mmol/L (ref 3.5–5.1)
Sodium: 140 mmol/L (ref 135–145)
Total Bilirubin: 0.4 mg/dL (ref 0.3–1.2)
Total Protein: 8.5 g/dL — ABNORMAL HIGH (ref 6.5–8.1)

## 2019-09-02 LAB — CBC WITH DIFFERENTIAL (CANCER CENTER ONLY)
Abs Immature Granulocytes: 0.02 10*3/uL (ref 0.00–0.07)
Basophils Absolute: 0.1 10*3/uL (ref 0.0–0.1)
Basophils Relative: 1 %
Eosinophils Absolute: 0 10*3/uL (ref 0.0–0.5)
Eosinophils Relative: 0 %
HCT: 37 % (ref 36.0–46.0)
Hemoglobin: 11.4 g/dL — ABNORMAL LOW (ref 12.0–15.0)
Immature Granulocytes: 0 %
Lymphocytes Relative: 35 %
Lymphs Abs: 2.1 10*3/uL (ref 0.7–4.0)
MCH: 22.7 pg — ABNORMAL LOW (ref 26.0–34.0)
MCHC: 30.8 g/dL (ref 30.0–36.0)
MCV: 73.7 fL — ABNORMAL LOW (ref 80.0–100.0)
Monocytes Absolute: 0.6 10*3/uL (ref 0.1–1.0)
Monocytes Relative: 10 %
Neutro Abs: 3.3 10*3/uL (ref 1.7–7.7)
Neutrophils Relative %: 54 %
Platelet Count: 316 10*3/uL (ref 150–400)
RBC: 5.02 MIL/uL (ref 3.87–5.11)
RDW: 15.4 % (ref 11.5–15.5)
WBC Count: 6.2 10*3/uL (ref 4.0–10.5)
nRBC: 0 % (ref 0.0–0.2)

## 2019-09-02 LAB — IRON AND TIBC
Iron: 79 ug/dL (ref 41–142)
Saturation Ratios: 31 % (ref 21–57)
TIBC: 254 ug/dL (ref 236–444)
UIBC: 175 ug/dL (ref 120–384)

## 2019-09-02 LAB — LACTATE DEHYDROGENASE: LDH: 148 U/L (ref 98–192)

## 2019-09-02 LAB — FERRITIN: Ferritin: 314 ng/mL — ABNORMAL HIGH (ref 11–307)

## 2019-09-03 LAB — BETA 2 MICROGLOBULIN, SERUM: Beta-2 Microglobulin: 1.7 mg/L (ref 0.6–2.4)

## 2019-09-03 LAB — KAPPA/LAMBDA LIGHT CHAINS
Kappa free light chain: 68.8 mg/L — ABNORMAL HIGH (ref 3.3–19.4)
Kappa, lambda light chain ratio: 4.22 — ABNORMAL HIGH (ref 0.26–1.65)
Lambda free light chains: 16.3 mg/L (ref 5.7–26.3)

## 2019-09-03 LAB — IGG, IGA, IGM
IgA: 297 mg/dL (ref 87–352)
IgG (Immunoglobin G), Serum: 1697 mg/dL — ABNORMAL HIGH (ref 586–1602)
IgM (Immunoglobulin M), Srm: 229 mg/dL — ABNORMAL HIGH (ref 26–217)

## 2019-09-09 ENCOUNTER — Encounter: Payer: Self-pay | Admitting: Physician Assistant

## 2019-09-09 ENCOUNTER — Telehealth: Payer: Self-pay | Admitting: Physician Assistant

## 2019-09-09 ENCOUNTER — Inpatient Hospital Stay (HOSPITAL_BASED_OUTPATIENT_CLINIC_OR_DEPARTMENT_OTHER): Payer: BC Managed Care – PPO | Admitting: Physician Assistant

## 2019-09-09 ENCOUNTER — Other Ambulatory Visit: Payer: Self-pay

## 2019-09-09 VITALS — BP 142/71 | HR 85 | Temp 97.4°F | Resp 17 | Ht 67.0 in | Wt 157.4 lb

## 2019-09-09 DIAGNOSIS — D563 Thalassemia minor: Secondary | ICD-10-CM | POA: Diagnosis not present

## 2019-09-09 DIAGNOSIS — D500 Iron deficiency anemia secondary to blood loss (chronic): Secondary | ICD-10-CM

## 2019-09-09 DIAGNOSIS — D472 Monoclonal gammopathy: Secondary | ICD-10-CM | POA: Diagnosis not present

## 2019-09-09 DIAGNOSIS — D509 Iron deficiency anemia, unspecified: Secondary | ICD-10-CM | POA: Diagnosis not present

## 2019-09-09 NOTE — Telephone Encounter (Signed)
Scheduled appt per 10/27 los.  Spoke with pt and she is aware of the appt date and time.

## 2019-09-09 NOTE — Progress Notes (Signed)
[facility] OFFICE PROGRESS NOTE  Jessica Moron, MD [place] [address]  DIAGNOSIS:  1) Microcytic, hypochromic anemia secondary to iron deficiency secondary to menorrhagia. 2) beta thalassemia minor 3) monoclonal gammopathysuspicious for multiple myeloma.  PRIOR THERAPY: Feraheme infusion x2 doses last dose was giving June 14, 2018  CURRENT THERAPY: Over-the-counter ferrous sulfate 1 tablet p.o. daily  INTERVAL HISTORY: Jessica [name] 48 y.o. female returns to the clinic for a follow-up visit.  The patient is feeling well today without any concerning complaints except for bilateral hip pain for the last few months. She describes the pain as achy and rates her pain a 4/10. She mostly notices her discomfort at night and has a hard time getting comfortable.  She also reports fatigue for the last 2-3 months. She states that she will be walking in a store and feel like she needs to take a break and sit down. She reports constipation for which she takes miralax. She denies any  weakness, dizziness, nausea, vomiting, diarrhea, shortness of breath, cough, or hemoptysis.  She denies any recent infections. She denies any bleeding or bruising including epistaxis, gingival bleeding, hematuria, melena, hematochezia.  She recently had a repeat myeloma panel performed as well as a CBC, iron panel, and ferritin.  She is here today for evaluation to review her lab results.  MEDICAL HISTORY: Past Medical History:  Diagnosis Date  . Allergy   . Anemia   . Anxiety   . Depression   . Hyperlipidemia     ALLERGIES:  is allergic to sulfonamide derivatives.  MEDICATIONS:  Current Outpatient Medications  Medication Sig Dispense Refill  . atorvastatin (LIPITOR) 20 MG tablet Take 1 tablet (20 mg total) by mouth daily. 90 tablet 3  . traZODone (DESYREL) 50 MG tablet TAKE 0.5-1 TABLETS (25-50 MG TOTAL) BY MOUTH AT BEDTIME AS NEEDED FOR SLEEP. 90 tablet 1  . venlafaxine XR  (EFFEXOR-XR) 37.5 MG 24 hr capsule TAKE 1 CAPSULE (37.5 MG TOTAL) BY MOUTH DAILY WITH BREAKFAST. 90 capsule 0   No current facility-administered medications for this visit.     SURGICAL HISTORY:  Past Surgical History:  Procedure Laterality Date  . TUBAL LIGATION      REVIEW OF SYSTEMS:   Review of Systems  Constitutional: Positive for fatigue. Negative for appetite change, chills, fever and unexpected weight change.  HENT: Negative for mouth sores, nosebleeds, sore throat and trouble swallowing.   Eyes: Positive for dry eyes bilaterally. Negative for icterus.  Respiratory: Negative for cough, hemoptysis, shortness of breath and wheezing.   Cardiovascular: Negative for chest pain and leg swelling.  Gastrointestinal: Positive for occasional constipation. Negative for abdominal pain, diarrhea, nausea and vomiting.  Genitourinary: Negative for bladder incontinence, difficulty urinating, dysuria, frequency and hematuria.   Musculoskeletal: Positive for hip pain bilaterally. Negative for back pain, gait problem, neck pain and neck stiffness.  Skin: Negative for itching and rash.  Neurological: Negative for dizziness, extremity weakness, gait problem, headaches, light-headedness and seizures.  Hematological: Negative for adenopathy. Does not bruise/bleed easily.  Psychiatric/Behavioral: Negative for confusion, depression and sleep disturbance. The patient is not nervous/anxious.     PHYSICAL EXAMINATION:  Blood pressure (!) 142/71, pulse 85, temperature (!) 97.4 F (36.3 C), temperature source Temporal, resp. rate 17, height '5\' 7"'  (1.702 m), weight 157 lb 6.4 oz (71.4 kg), SpO2 100 %.  ECOG PERFORMANCE STATUS: 0 - Asymptomatic  Physical Exam  Constitutional: Oriented to person, place, and time and well-developed, well-nourished, and  in no distress.  HENT:  Head: Normocephalic and atraumatic.  Mouth/Throat: Oropharynx is clear and moist. No oropharyngeal exudate.  Eyes: Conjunctivae  are normal. Right eye exhibits no discharge. Left eye exhibits no discharge. No scleral icterus.  Neck: Normal range of motion. Neck supple.  Cardiovascular: Normal rate, regular rhythm, normal heart sounds and intact distal pulses.   Pulmonary/Chest: Effort normal and breath sounds normal. No respiratory distress. No wheezes. No rales.  Abdominal: Soft. Bowel sounds are normal. Exhibits no distension and no mass. There is no tenderness.  Musculoskeletal: Normal range of motion. Exhibits no edema.  Lymphadenopathy:    No cervical adenopathy.  Neurological: Alert and oriented to person, place, and time. Exhibits normal muscle tone. Gait normal. Coordination normal.  Skin: Skin is warm and dry. No rash noted. Not diaphoretic. No erythema. No pallor.  Psychiatric: Mood, memory and judgment normal.  Vitals reviewed.  LABORATORY DATA: Lab Results  Component Value Date   WBC 6.2 09/02/2019   HGB 11.4 (L) 09/02/2019   HCT 37.0 09/02/2019   MCV 73.7 (L) 09/02/2019   PLT 316 09/02/2019      Chemistry      Component Value Date/Time   NA 140 09/02/2019 1417   NA 141 04/11/2018 1752   K 4.1 09/02/2019 1417   CL 104 09/02/2019 1417   CO2 24 09/02/2019 1417   BUN 11 09/02/2019 1417   BUN 11 04/11/2018 1752   CREATININE 1.06 (H) 09/02/2019 1417   CREATININE 0.98 09/10/2017 1333      Component Value Date/Time   CALCIUM 9.8 09/02/2019 1417   ALKPHOS 54 09/02/2019 1417   AST 13 (L) 09/02/2019 1417   ALT 8 09/02/2019 1417   BILITOT 0.4 09/02/2019 1417       RADIOGRAPHIC STUDIES:  No results found.   ASSESSMENT/PLAN:  This is a very pleasant 48 year old [place] American female who presented for evaluation of microcytic normochromic anemia likely secondary to beta thalassemia minor and iron deficiency. She was treated with Feraheme in the past and felt a little better.   Myeloma panel performed in the past that showed elevated free kappa light chain concerning for monoclonal  gammopathy but a bone marrow biopsy was performed to rule out any underlying multiple myeloma. The patient underwent a bone marrow biopsy and aspirate and that showed increased level of monoclonal plasma cells 5-10% suspicious for plasma cell neoplasm. The patient is currently on observation and she is feeling fine.  The patient was seen with Dr. Julien Nordmann today.  Labs were reviewed which included a myeloma panel CBC, CMP, ferritin, and iron panel which were stable.   Dr. Julien Nordmann recommends that the patient continue on observation with a repeat CBC, CMP, LDH, myeloma panel, ferritin, and iron studies in 6 months.   We will see the patient back for follow-up visit in 6 months for evaluation and to review her blood work.   She will continue taking her oral iron supplement. She will continue to use miralax for her constipation.   The patient was given a handout on Thalassemia per her request for more information.   The patient had a bone survey in 12/2018 which did not show any concerning findings except for arthritis.   The patient was advised to call immediately if she has any concerning symptoms in the interval. The patient voices understanding of current disease status and treatment options and is in agreement with the current care plan. All questions were answered. The patient knows to  call the clinic with any problems, questions or concerns. We can certainly see the patient much sooner if necessary  Orders Placed This Encounter  Procedures  . CBC with Differential (Cancer Center Only)    Standing Status:   Future    Standing Expiration Date:   09/08/2020  . CMP ([facility] only)    Standing Status:   Future    Standing Expiration Date:   09/08/2020  . Ferritin    Standing Status:   Future    Standing Expiration Date:   09/08/2020  . Iron and TIBC    Standing Status:   Future    Standing Expiration Date:   09/08/2020  . Lactate dehydrogenase (LDH)    Standing Status:   Future     Standing Expiration Date:   09/08/2020  . Beta 2 microglobulin    Standing Status:   Future    Standing Expiration Date:   09/08/2020  . QIG  (Quant. immunoglobulins  - IgG, IgA, IgM)    Standing Status:   Future    Standing Expiration Date:   09/08/2020  . Kappa/lambda light chains    Standing Status:   Future    Standing Expiration Date:   09/08/2020      L , PA-C 09/09/19   ADDENDUM: Hematology/Oncology Attending: I had a face-to-face encounter with the patient today.  I recommended her care plan.  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  The patient is currently on oral iron tablets and has been tolerating it fairly well. She had repeat myeloma panel as well as iron study and ferritin recently.  The patient has no concerning findings for disease progression of her MGUS or significant iron deficiency. 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 I recommended for the patient to continue with the oral iron tablets for now. We will see her back for follow-up visit in 6 months for evaluation with repeat myeloma panel as well as iron study and ferritin. The patient was advised to call immediately if she has any concerning symptoms in the interval.  Disclaimer: This note was dictated with voice recognition software. Similar sounding words can inadvertently be transcribed and may be missed upon review. [name], MD 09/09/19

## 2019-09-09 NOTE — Patient Instructions (Signed)
Thalassemia  Thalassemia is a blood disorder that causes a low level of red blood cells (anemia). This condition is passed from parent to child through abnormal genes (gene mutations). The mutations make it hard for your body to make the protein in red blood cells (hemoglobin) that carries oxygen from your lungs to the rest of your body. Red blood cells do not live long without hemoglobin. Loss of red blood cells leads to anemia, which is the main symptom of thalassemia. There are two main types of thalassemia. The type depends on which part of the hemoglobin is affected.  Alpha thalassemia affects the alpha part of the hemoglobin. This is caused by four genes. You could get two from each parent.  Beta thalassemia affects the beta part of the hemoglobin. This is caused by two genes. You could get one from each parent. Thalassemia can be mild or severe. It depends on how many gene mutations you are born with. The more gene mutations you get, the more severe the condition. A person who inherits just one gene will be a carrier of the condition (thalassemia trait). A person with thalassemia trait may not have any symptoms or may have only mild anemia. A person who inherits two or more genes can have thalassemia minor, thalassemia intermedia, or thalassemia major. Thalassemia is a lifelong condition. There is no cure, but treatment can control symptoms and manage the condition. What are the causes? Thalassemia is cause by gene mutations that are passed down through families. What increases the risk? You are more likely to develop this condition if:  You have a family history of thalassemia.  Your ancestors are from [place], [place], [place], [place], [place], or the [place]. What are the signs or symptoms? The most common signs and symptoms of thalassemia are the signs and symptoms of anemia. They include:  Weakness.  Tiredness.  Pounding heartbeat.  Dizziness.  Headache.  Leg  cramps.  Pale skin.  Confusion.  Shortness of breath. Other signs and symptoms can also occur. You may have:  Yellow eyes or skin, and dark urine (jaundice). The breakdown of red blood cells can cause a yellowing pigment (bilirubin) to build up in your blood.  Weak bones (osteoporosis) and bone fractures. This is because bones can weaken from the effort of making more hemoglobin.  An enlarged spleen. This can lead to a swollen belly. Your spleen can become enlarged from filtering dead red blood cells.  Frequent, severe infections. This occurs if your spleen and bone marrow become weak. These organs make white blood cells that your body needs to fight infections. How is this diagnosed? Your health care provider may suspect thalassemia based on your signs and symptoms, especially if you have a family history of the condition. This condition may be diagnosed:  In childhood, if you have severe forms of thalassemia. This is because symptoms show early in life.  At birth. In the [place] babies are screened for this condition.  In adulthood, if you have thalassemia trait or thalassemia minor. This happens if symptoms of anemia start or if a routine blood test shows unexplained anemia. Blood tests can confirm a thalassemia diagnosis. Blood tests may show:  Low hemoglobin.  Low iron.  Abnormal hemoglobin.  Thalassemia gene mutations. You may need to see a health care provider who specializes in blood diseases (hematologist). How is this treated? Treatment for this condition depends on the type of thalassemia that you have:  If you have thalassemia trait or thalassemia minor,  you may not need treatment. However, you may need treatment if you have thalassemia minor and you develop symptoms during an infection.  If you have thalassemia intermedia, you will have symptoms that require treatment.  If you have major thalassemia, you will have serious symptoms that require regular treatment.  Thalassemia treatment may include:  Donated blood (transfusions) to replace red blood cells.  Vitamin B (folic acid) supplements to help produce hemoglobin and red blood cells.  Medicines or injections to remove iron buildup (chelation). This can happen in people who have frequent transfusions. Iron overload can damage heart, liver, and brain cells.  In the case of severe thalassemia: ? The spleen may need to be removed if it becomes damaged. ? Stem cell or bone marrow transplants may be necessary to transplant cells that can make red blood cells. This may be done if transfusions are not working. Follow these instructions at home: Eating and drinking   Follow instructions from your health care provider about eating or drinking restrictions. You may need to avoid foods or drinks that are high in iron or fortified with iron.  Eat foods that are high in fiber, such as fresh fruits and vegetables, whole grains, and beans. Limit foods that are high in fat and processed sugars, such as fried and sweet foods. Nutrition is important for preventing anemia. Activity  Return to your normal activities as told by your health care provider. Ask your health care provider what activities are safe for you.  Exercise is important for maintaining energy and strong bones. Ask your health care provider what amount and type of exercise is safe for you. General instructions   Take over-the-counter and prescription medicines only as told by your health care provider.  Keep all routine vaccinations and flu shots up to date to reduce your risk of infection.  Wash your hands frequently.  Do your best to avoid sick people, and stay out of crowds during cold and flu seasons.  Meet with a Dietitian if you are or may become pregnant. A genetic counselor can explain the risks of passing thalassemia to a child.  Keep all follow-up visits as told by your health care provider. This is important. Contact a  health care provider if:  You have signs or symptoms of anemia.  You have a fever or other signs of infection.  Your belly is swollen.  You have jaundice. Get help right away if:  You feel very weak or short of breath. Summary  Thalassemia is a blood disorder that causes anemia.  Thalassemia can range from mild to severe.  This condition is passed down through families.  There is no cure, but treatment can manage the symptoms and prevent anemia. This information is not intended to replace advice given to you by your health care provider. Make sure you discuss any questions you have with your health care provider. Document Released: 02/26/2018 Document Revised: 02/26/2018 Document Reviewed: 02/26/2018 Elsevier Patient Education  [place].

## 2019-09-10 ENCOUNTER — Encounter: Payer: Self-pay | Admitting: Family Medicine

## 2019-09-10 ENCOUNTER — Ambulatory Visit: Payer: BC Managed Care – PPO | Admitting: Family Medicine

## 2019-09-10 ENCOUNTER — Other Ambulatory Visit: Payer: Self-pay

## 2019-09-10 VITALS — BP 122/84 | HR 79 | Temp 98.9°F | Ht 67.0 in | Wt 154.8 lb

## 2019-09-10 DIAGNOSIS — R002 Palpitations: Secondary | ICD-10-CM | POA: Diagnosis not present

## 2019-09-10 DIAGNOSIS — F418 Other specified anxiety disorders: Secondary | ICD-10-CM

## 2019-09-10 DIAGNOSIS — Z8249 Family history of ischemic heart disease and other diseases of the circulatory system: Secondary | ICD-10-CM | POA: Diagnosis not present

## 2019-09-10 DIAGNOSIS — M25552 Pain in left hip: Secondary | ICD-10-CM

## 2019-09-10 DIAGNOSIS — M25551 Pain in right hip: Secondary | ICD-10-CM | POA: Diagnosis not present

## 2019-09-10 DIAGNOSIS — N951 Menopausal and female climacteric states: Secondary | ICD-10-CM

## 2019-09-10 NOTE — Progress Notes (Signed)
Established Patient Office Visit  Subjective:  Patient ID: Jessica [name], female    DOB: [birth year]  Age: 48 y.o. MRN: [id number]  CC:  Chief Complaint  Patient presents with  . Palpitations    started back a few days ago, thinking she may need to get bk on anxiety med. Also having heaviness in the chest andd left arm. Pain went away after 3 days    HPI Jessica [name] presents for   Anxiety and Palpitations Patient reports that she stopped the venlafaxine  She reports that she felt more anxiety and is more aware of the palpitations She states that on the venlafaxine she was less irritable She reports that she is not in menopause based on [facility] and LH done with Gynecology She reports that off the venlafaxine she has more anxiety She is worried about her risk of a heart attack. Her mother died at age 32.      Depression screen [facility] 2/9 06/16/2019 09/23/2018 05/01/2018 04/11/2018 04/11/2018  Decreased Interest 0 0 0 2 0  Down, Depressed, Hopeless 0 0 0 2 0  PHQ - 2 Score 0 0 0 4 0  Altered sleeping - - - 3 -  Tired, decreased energy - - - 1 -  Change in appetite - - - 0 -  Feeling bad or failure about yourself  - - - 2 -  Trouble concentrating - - - 2 -  Moving slowly or fidgety/restless - - - 0 -  Suicidal thoughts - - - 0 -  PHQ-9 Score - - - 12 -  Difficult doing work/chores - - - Somewhat difficult -   GAD 7 : Generalized Anxiety Score 09/10/2019 04/11/2018  Nervous, Anxious, on Edge 1 2  Control/stop worrying 1 3  Worry too much - different things 1 3  Trouble relaxing 1 2  Restless 0 0  Easily annoyed or irritable 1 1  Afraid - awful might happen 1 2  Total GAD 7 Score 6 13  Anxiety Difficulty Not difficult at all Somewhat difficult     Family History of MI in mother at age 17 She is worried about her heart disease risk She is concerned about her cholesterol as well   The 10-year ASCVD risk score Mikey Bussing DC Brooke Bonito., et al., 2013) is: 1.1%   Values used to calculate  the score:     Age: 5 years     Sex: Female     Is Non-Hispanic African American: Yes     Diabetic: No     Tobacco smoker: No     Systolic Blood Pressure: 123XX123 mmHg     Is BP treated: No     HDL Cholesterol: 70 mg/dL     Total Cholesterol: 265 mg/dL  Lab Results  Component Value Date   CHOL 265 (H) 06/16/2019   CHOL 231 (H) 04/11/2018   CHOL 307 (H) 09/10/2017   Lab Results  Component Value Date   HDL 70 06/16/2019   HDL 94 04/11/2018   HDL 93 09/10/2017   Lab Results  Component Value Date   LDLCALC 175 (H) 06/16/2019   LDLCALC 129 (H) 04/11/2018   LDLCALC 195 (H) 09/10/2017   Lab Results  Component Value Date   TRIG 90 06/16/2019   TRIG 38 04/11/2018   TRIG 80 09/10/2017   Lab Results  Component Value Date   CHOLHDL 3.8 06/16/2019   CHOLHDL 2.5 04/11/2018   CHOLHDL 3.3 09/10/2017   No results found for: LDLDIRECT  Past Medical History:  Diagnosis Date  . Allergy   . Anemia   . Anxiety   . Depression   . Hyperlipidemia     Past Surgical History:  Procedure Laterality Date  . TUBAL LIGATION      Family History  Problem Relation Age of Onset  . Alcohol abuse Father   . Drug abuse Father   . Hyperlipidemia Father   . Hypertension Father   . Alcohol abuse Maternal Uncle   . Heart disease Mother   . Hyperlipidemia Mother   . Hypertension Mother   . Heart disease Maternal Grandmother   . Hypertension Maternal Grandmother   . Hyperlipidemia Maternal Grandmother   . Cancer Paternal Grandmother   . Diabetes Paternal Grandmother   . Hypertension Paternal Grandmother   . Cancer Paternal Grandfather   . Breast cancer Neg Hx     Social History   Socioeconomic History  . Marital status: Married    Spouse name: Not on file  . Number of children: Not on file  . Years of education: Not on file  . Highest education level: Not on file  Occupational History  . Not on file  Social Needs  . Financial resource strain: Not on file  . Food insecurity     Worry: Not on file    Inability: Not on file  . Transportation needs    Medical: Not on file    Non-medical: Not on file  Tobacco Use  . Smoking status: Never Smoker  . Smokeless tobacco: Never Used  Substance and Sexual Activity  . Alcohol use: No    Alcohol/week: 0.0 standard drinks  . Drug use: No  . Sexual activity: Yes  Lifestyle  . Physical activity    Days per week: Not on file    Minutes per session: Not on file  . Stress: Not on file  Relationships  . Social Herbalist on phone: Not on file    Gets together: Not on file    Attends religious service: Not on file    Active member of club or organization: Not on file    Attends meetings of clubs or organizations: Not on file    Relationship status: Not on file  . Intimate partner violence    Fear of current or ex partner: Not on file    Emotionally abused: Not on file    Physically abused: Not on file    Forced sexual activity: Not on file  Other Topics Concern  . Not on file  Social History Narrative  . Not on file    Outpatient Medications Prior to Visit  Medication Sig Dispense Refill  . atorvastatin (LIPITOR) 20 MG tablet Take 1 tablet (20 mg total) by mouth daily. 90 tablet 3  . traZODone (DESYREL) 50 MG tablet TAKE 0.5-1 TABLETS (25-50 MG TOTAL) BY MOUTH AT BEDTIME AS NEEDED FOR SLEEP. 90 tablet 1  . venlafaxine XR (EFFEXOR-XR) 37.5 MG 24 hr capsule TAKE 1 CAPSULE (37.5 MG TOTAL) BY MOUTH DAILY WITH BREAKFAST. 90 capsule 0   No facility-administered medications prior to visit.     Allergies  Allergen Reactions  . Sulfonamide Derivatives     REACTION: hives    ROS Review of Systems Review of Systems  Constitutional: Negative for activity change, appetite change, chills and fever.  HENT: Negative for congestion, nosebleeds, trouble swallowing and voice change.   Respiratory: Negative for cough, shortness of breath and wheezing.   Gastrointestinal: Negative for  diarrhea, nausea and  vomiting.  Genitourinary: Negative for difficulty urinating, dysuria, flank pain and hematuria.  Musculoskeletal: Negative for back pain, joint swelling and neck pain.  Neurological: Negative for dizziness, speech difficulty, light-headedness and numbness.  See HPI. All other review of systems negative.     Objective:    Physical Exam  BP 122/84   Pulse 79   Temp 98.9 F (37.2 C)   Ht 5\' 7"  (1.702 m)   Wt 154 lb 12.8 oz (70.2 kg)   LMP 09/09/2019   SpO2 99%   BMI 24.25 kg/m  Wt Readings from Last 3 Encounters:  09/10/19 154 lb 12.8 oz (70.2 kg)  09/09/19 157 lb 6.4 oz (71.4 kg)  06/16/19 156 lb (70.8 kg)   Physical Exam  Constitutional: Oriented to person, place, and time. Appears well-developed and well-nourished.  HENT:  Head: Normocephalic and atraumatic.  Eyes: Conjunctivae and EOM are normal.  Cardiovascular: Normal rate, regular rhythm, normal heart sounds and intact distal pulses.  No murmur heard. Pulmonary/Chest: Effort normal and breath sounds normal. No stridor. No respiratory distress. Has no wheezes.  Abdomen: nondistended, normoactive bs, soft, nontender Musculoskeletal: low back without SI joint pain,  Neurological: Is alert and oriented to person, place, and time.  Skin: Skin is warm. Capillary refill takes less than 2 seconds.  Psychiatric: Has a normal mood and affect. Behavior is normal. Judgment and thought content normal.    There are no preventive care reminders to display for this patient.  There are no preventive care reminders to display for this patient.  Lab Results  Component Value Date   TSH 1.58 06/16/2019   Lab Results  Component Value Date   WBC 6.2 09/02/2019   HGB 11.4 (L) 09/02/2019   HCT 37.0 09/02/2019   MCV 73.7 (L) 09/02/2019   PLT 316 09/02/2019   Lab Results  Component Value Date   NA 140 09/02/2019   K 4.1 09/02/2019   CO2 24 09/02/2019   GLUCOSE 96 09/02/2019   BUN 11 09/02/2019   CREATININE 1.06 (H) 09/02/2019    BILITOT 0.4 09/02/2019   ALKPHOS 54 09/02/2019   AST 13 (L) 09/02/2019   ALT 8 09/02/2019   PROT 8.5 (H) 09/02/2019   ALBUMIN 4.3 09/02/2019   CALCIUM 9.8 09/02/2019   ANIONGAP 12 09/02/2019   Lab Results  Component Value Date   CHOL 265 (H) 06/16/2019   Lab Results  Component Value Date   HDL 70 06/16/2019   Lab Results  Component Value Date   LDLCALC 175 (H) 06/16/2019   Lab Results  Component Value Date   TRIG 90 06/16/2019   Lab Results  Component Value Date   CHOLHDL 3.8 06/16/2019   Lab Results  Component Value Date   HGBA1C 5.4 06/16/2019   ECG NSR, no twi   Assessment & Plan:   Problem List Items Addressed This Visit    None    Visit Diagnoses    Heart palpitations    -  Primary Provided reassurance Discussed that she may need to just take an as needed medication for her palpitation like metoprolol if she continues to have symptoms after starting anxiety treatment with counseling as well as making a list of all the things she can do to honor her mother who died at age 36 Her anxiety is tied to this   Relevant Orders   EKG 12-Lead (Completed)   Hip pain, bilateral    - advised follow up with PT  Relevant Orders   Ambulatory referral to Physical Therapy   Anxiety about health       Family history of heart attack       Perimenopause    -  Discussed her symptoms and offered reassurance that mood changes can occur close to menopause      No orders of the defined types were placed in this encounter.   Follow-up: No follow-ups on file.   A total of 45 minutes were spent face-to-face with the patient during this encounter and over half of that time was spent on counseling and coordination of care.   [name], MD

## 2019-09-10 NOTE — Patient Instructions (Signed)
° ° ° °  If you have lab work done today you will be contacted with your lab results within the next 2 weeks.  If you have not heard from us then please contact us. The fastest way to get your results is to register for My Chart. ° ° °IF you received an x-ray today, you will receive an invoice from [facility]. Please contact [facility] at [phone] with questions or concerns regarding your invoice.  ° °IF you received labwork today, you will receive an invoice from [facility]. Please contact [facility] at [phone] with questions or concerns regarding your invoice.  ° °Our billing staff will not be able to assist you with questions regarding bills from these companies. ° °You will be contacted with the lab results as soon as they are available. The fastest way to get your results is to activate your My Chart account. Instructions are located on the last page of this paperwork. If you have not heard from us regarding the results in 2 weeks, please contact this office. °  ° ° ° °

## 2019-09-26 ENCOUNTER — Ambulatory Visit: Payer: BC Managed Care – PPO | Attending: Family Medicine | Admitting: Physical Therapy

## 2019-09-26 ENCOUNTER — Other Ambulatory Visit: Payer: Self-pay

## 2019-09-26 DIAGNOSIS — R293 Abnormal posture: Secondary | ICD-10-CM

## 2019-09-26 DIAGNOSIS — M25552 Pain in left hip: Secondary | ICD-10-CM

## 2019-09-26 DIAGNOSIS — M25551 Pain in right hip: Secondary | ICD-10-CM

## 2019-09-26 NOTE — Therapy (Signed)
[place], [place], [address] Phone: [phone]   Fax:  [phone]  Physical Therapy Evaluation  Patient Details  Name: Jessica [name] MRN: [id number] Date of Birth: [birth year] Referring Provider (PT): Dr. Delia Chimes   Encounter Date: 09/26/2019  PT End of Session - 09/26/19 1211    Visit Number  1    Number of Visits  8    Date for PT Re-Evaluation  11/21/19    PT Start Time  1130    PT Stop Time  1215    PT Time Calculation (min)  45 min    Activity Tolerance  Patient tolerated treatment well       Past Medical History:  Diagnosis Date  . Allergy   . Anemia   . Anxiety   . Depression   . Hyperlipidemia     Past Surgical History:  Procedure Laterality Date  . TUBAL LIGATION      There were no vitals filed for this visit.   Subjective Assessment - 09/26/19 1136    Subjective  Pt with onset of hip pain in June.  She has pain in bilateral hips mostly at night.  It is hard for her to sit crossed legged on the floor.  She had stopped going to the gym when [place] began.  Used to do the bike, weights.  She does not exercise anymore.    Pertinent History  MGUS, thalessemia, anxiety    Limitations  Other (comment);Sitting    Diagnostic tests  none    Patient Stated Goals  Patient would like to get rid of the pain    Currently in Pain?  No/denies    Pain Score  6     Pain Location  Hip    Pain Orientation  Right;Left;Lateral    Pain Descriptors / Indicators  Aching;Dull;Sore         [facility] PT Assessment - 09/26/19 0001      Assessment   Medical Diagnosis  bilateral hip pain     Referring Provider (PT)  Dr. Delia Chimes    Onset Date/Surgical Date  --   June 2020   Prior Therapy  No       Precautions   Precautions  None      Restrictions   Weight Bearing Restrictions  No      Balance Screen   Has the patient fallen in the past 6 months  No      [place] residence    Living Arrangements  Spouse/significant other;Children    Type of [place] Access  Level entry    Home Layout  One level      Prior Function   Level of Independence  Independent with basic ADLs;Independent with household mobility with device;Independent with community mobility with device    Vocation  Unemployed    Vocation Requirements  used to be CNA and work in medical records     Leisure  "I'm a hermit" does go out for groceries but stays home alot , likes to be outside in the yard      Cognition   Overall Cognitive Status  Within Functional Limits for tasks assessed      Observation/Other Assessments   Focus on Therapeutic Outcomes (FOTO)   5%      Sensation   Light Touch  Appears Intact      Coordination   Gross  Motor Movements are Fluid and Coordinated  Not tested      Squat   Comments  Rt hip adduction, Rt genu valgus      Single Leg Stance   Comments  WNL , min unsteadiness on LLE       Posture/Postural Control   Posture Comments  flat feet, genu recurvatum       AROM   Overall AROM Comments  lumbar flexion, ext WNL     Right/Left Hip  --   WFL      PROM   Overall PROM Comments  hypermobility in hip IR bilaterally       Strength   Right Hip Flexion  5/5    Right Hip Extension  4/5    Right Hip ABduction  4/5    Left Hip Flexion  5/5    Left Hip Extension  4/5    Left Hip ABduction  4/5    Right Knee Flexion  5/5    Right Knee Extension  5/5    Left Knee Flexion  5/5    Left Knee Extension  5/5      Flexibility   ITB  tight L >R       Palpation   Palpation comment  min tenderness in Greater trochanter and bilateral ASIS           Objective measurements completed on examination: See above findings.      PT Education - 09/26/19 1211    Education Details  PT/POC, HEP, bursitis, LE alignment    Person(s) Educated  Patient    Methods  Explanation;Demonstration;Handout    Comprehension   Returned demonstration;Verbalized understanding       PT Short Term Goals - 09/26/19 1934      PT SHORT TERM GOAL #1   Title  Pt will be I with HEP for hips, core    Time  4    Period  Weeks    Status  New    Target Date  10/24/19      PT SHORT TERM GOAL #2   Title  Pt will be able to sit crossed legged on the floor without increased pain in hips.    Time  4    Period  Weeks    Status  New    Target Date  10/24/19      PT SHORT TERM GOAL #3   Title  Pt will report 30 min walks 2-3 times per week for general health and fitness.    Time  4    Period  Weeks    Status  New    Target Date  10/24/19        PT Long Term Goals - 09/26/19 1935      PT LONG TERM GOAL #1   Title  Pt will be able to lie on each side at night without increasing pain, less waking from discomfort    Time  8    Period  Weeks    Status  New    Target Date  11/21/19      PT LONG TERM GOAL #2   Title  Pt will demo increased lateral hip strength (5/5 ) in abduction for maximal joint support.    Time  8    Period  Weeks    Status  New    Target Date  11/21/19      PT LONG TERM GOAL #3   Title  Pt will squat with good LE  alignment, no cues to show proper muscle activation through biomechanical chain    Time  8    Period  Weeks    Status  New    Target Date  11/21/19      PT LONG TERM GOAL #4   Title  Pt will be I with HEP for hips, core upon discharge    Time  8    Period  Weeks    Target Date  11/21/19             Plan - 09/26/19 1938    Clinical Impression Statement  Patient presents with low complexity of eval for bilateral hip pain which presents as bursitis due to chronic poor joint support.  She has flat feet, pronates, hyperextends knees and femoral anteversion.  this has caused increased strain on rectus femoris and her recent decrease in physcial activity has perhaps caused progression of joint malalignment.  She hopes to gain her comfort and pain relief at ngiht so she can rest  better as well as get back to a regular exercise program.    Personal Factors and Comorbidities  Comorbidity 1    Comorbidities  MGUS (monoclonal gammopathy- bone marrow production, thalassemia (fatigue, decreased Hb)    Examination-Activity Limitations  Sit;Sleep    Examination-Participation Restrictions  Community Activity;Other;Interpersonal Relationship    Stability/Clinical Decision Making  Stable/Uncomplicated    Clinical Decision Making  Low    Rehab Potential  Excellent    PT Duration  8 weeks    PT Treatment/Interventions  ADLs/Self Care Home Management;Electrical Stimulation;Moist Heat;Iontophoresis 4mg /ml Dexamethasone;Ultrasound;Therapeutic exercise;Therapeutic activities;Functional mobility training;Neuromuscular re-education;Manual techniques;Dry needling;Taping;Patient/family education    PT Next Visit Plan  check HEP, cont hip and core strength, CCK hip, modalities for pain    PT Home Exercise Plan  clam, hip abd, ITB and anterior hip stretch (thomas test)    Consulted and Agree with Plan of Care  Patient       Patient will benefit from skilled therapeutic intervention in order to improve the following deficits and impairments:  Postural dysfunction, Pain, Impaired flexibility, Increased fascial restricitons, Decreased strength  Visit Diagnosis: Pain in right hip  Pain in left hip  Abnormal posture     Problem List Patient Active Problem List   Diagnosis Date Noted  . MGUS (monoclonal gammopathy of unknown significance) 11/11/2018  . Beta thalassemia minor 08/12/2018  . Obsessive compulsive disorder 02/18/2014  . Major depressive disorder, recurrent episode, moderate ([place]) 05/14/2013  . Major depressive disorder, recurrent episode, severe, without mention of psychotic behavior 03/05/2013  . Major depressive disorder, recurrent episode, in full remission ([place]) 12/04/2012  . Social anxiety disorder 09/17/2012  . ANXIETY DISORDER, GENERALIZED 07/15/2008  .  HYPERCHOLESTEROLEMIA 10/07/2007  . VITILIGO 10/07/2007  . ANEMIA, IRON DEFICIENCY, UNSPEC. 01/10/2007    Cutter Passey 09/26/2019, 7:48 PM  [facility] [address] [place], [place], [address] Phone: [phone]   Fax:  [phone]  Name: Johnathon Morejon MRN: [id number] Date of Birth: 30-Oct-1971   Raeford Razor, PT 09/26/19 7:49 PM Phone: [phone] Fax: [phone]

## 2019-09-26 NOTE — Patient Instructions (Signed)
Access Code: [email or web address]  URL: [email or web address]  Date: 09/26/2019  Prepared by: Raeford Razor   Exercises  Sidelying Hip Abduction - 10 reps - 2 sets - 5 hold - 2x daily - 7x weekly  Clamshell with Resistance - 10 reps - 2 sets - 5 hold - 2x daily - 7x weekly  Supine ITB Stretch with Strap - 3 reps - 1 sets - 30 hold - 2x daily - 7x weekly  Thomas Stretch on Table - 3 reps - 1 sets - 30 hold - 2x daily - 7x weekly

## 2019-10-03 ENCOUNTER — Other Ambulatory Visit: Payer: Self-pay

## 2019-10-03 ENCOUNTER — Encounter: Payer: Self-pay | Admitting: Physical Therapy

## 2019-10-03 ENCOUNTER — Ambulatory Visit: Payer: BC Managed Care – PPO | Admitting: Physical Therapy

## 2019-10-03 DIAGNOSIS — M25551 Pain in right hip: Secondary | ICD-10-CM

## 2019-10-03 DIAGNOSIS — M25552 Pain in left hip: Secondary | ICD-10-CM | POA: Diagnosis not present

## 2019-10-03 DIAGNOSIS — R293 Abnormal posture: Secondary | ICD-10-CM

## 2019-10-03 NOTE — Therapy (Signed)
[place], [place], [address] Phone: [phone]   Fax:  [phone]  Physical Therapy Treatment  Patient Details  Name: Jessica [name] MRN: [id number] Date of Birth: 28-Feb-1971 Referring Provider (PT): Dr. Delia Chimes   Encounter Date: 10/03/2019  PT End of Session - 10/03/19 1240    Visit Number  2    Number of Visits  8    Date for PT Re-Evaluation  11/21/19    PT Start Time  [id number]    PT Stop Time  1231    PT Time Calculation (min)  46 min    Activity Tolerance  Patient tolerated treatment well    Behavior During Therapy  [facility] for tasks assessed/performed       Past Medical History:  Diagnosis Date  . Allergy   . Anemia   . Anxiety   . Depression   . Hyperlipidemia     Past Surgical History:  Procedure Laterality Date  . TUBAL LIGATION      There were no vitals filed for this visit.  Subjective Assessment - 10/03/19 1147    Subjective  Pt. reports HEP has been going well and noting some improvement with pain, less discomfort at night than previously. She reports tightness > pain. Pain is on left>right around lateral hip/ASIS and upper quad region.    Currently in Pain?  Yes    Pain Score  4     Pain Location  Hip    Pain Orientation  Left;Right;Medial    Pain Descriptors / Indicators  Aching;Sore;Dull    Pain Onset  More than a month ago    Pain Frequency  Intermittent    Aggravating Factors   worse at night, exacerbated with standing posture with weight back on heels    Pain Relieving Factors  stretches                       [facility] Adult PT Treatment/Exercise - 10/03/19 0001      Exercises   Exercises  Knee/Hip      Knee/Hip Exercises: Stretches   Quad Stretch  Right;Left;3 reps;30 seconds    Quad Stretch Limitations  sidelying manual stretch for quad/hip flexors    ITB Stretch  Right;Left;3 reps;30 seconds    Piriformis Stretch  Right;Left;3 reps;30 seconds       Knee/Hip Exercises: Aerobic   Nustep  L4 x 5 min LE only      Knee/Hip Exercises: Standing   Hip Abduction  AROM;Stengthening;Both;10 reps    Abduction Limitations  Green band     Functional Squat  15 reps    Functional Squat Limitations  squat at counter    Other Standing Knee Exercises  Monster walk green band proximal to knees 20 feet x 2      Knee/Hip Exercises: Supine   Bridges with Clamshell  AROM;Strengthening;Both;15 reps   cues PPT     Knee/Hip Exercises: Sidelying   Clams  Green band 2x10 ea. bilat.      Manual Therapy   Manual Therapy  Soft tissue mobilization    Soft tissue mobilization  STM left lateral hip-TFL and glut med region, foam roll bilat. lateral hip, quad, IT band             PT Education - 10/03/19 1240    Education Details  HEP, symptom etiology, standing posture, tennis ball use for lateral hip muscle release    Person(s) Educated  Patient  Methods  Explanation;Demonstration;Verbal cues    Comprehension  Verbalized understanding;Returned demonstration       PT Short Term Goals - 09/26/19 1934      PT SHORT TERM GOAL #1   Title  Pt will be I with HEP for hips, core    Time  4    Period  Weeks    Status  New    Target Date  10/24/19      PT SHORT TERM GOAL #2   Title  Pt will be able to sit crossed legged on the floor without increased pain in hips.    Time  4    Period  Weeks    Status  New    Target Date  10/24/19      PT SHORT TERM GOAL #3   Title  Pt will report 30 min walks 2-3 times per week for general health and fitness.    Time  4    Period  Weeks    Status  New    Target Date  10/24/19        PT Long Term Goals - 09/26/19 1935      PT LONG TERM GOAL #1   Title  Pt will be able to lie on each side at night without increasing pain, less waking from discomfort    Time  8    Period  Weeks    Status  New    Target Date  11/21/19      PT LONG TERM GOAL #2   Title  Pt will demo increased lateral hip strength  (5/5 ) in abduction for maximal joint support.    Time  8    Period  Weeks    Status  New    Target Date  11/21/19      PT LONG TERM GOAL #3   Title  Pt will squat with good LE alignment, no cues to show proper muscle activation through biomechanical chain    Time  8    Period  Weeks    Status  New    Target Date  11/21/19      PT LONG TERM GOAL #4   Title  Pt will be I with HEP for hips, core upon discharge    Time  8    Period  Weeks    Target Date  11/21/19            Plan - 10/03/19 1240    Clinical Impression Statement  Pt. improving from baseline with decreased hip pain and tension. Still with standing postural tendency/LE alignment with genu recurvatum, foot pronation and femoral anteversion contributing to muscle tightness isssues but expect improvement with more awareness and corrective exercise. Tight/TTP in left>right gluteus medius and TFL-incuded manual therapy focus along with stretches and other exercises to help address. Pt. would benefit from continued PT for further progress to address functional limitations for mobility.    Personal Factors and Comorbidities  Comorbidity 1    Comorbidities  MGUS (monoclonal gammopathy- bone marrow production, thalassemia (fatigue, decreased Hb)    Examination-Activity Limitations  Sit;Sleep    Examination-Participation Restrictions  Community Activity;Other;Interpersonal Relationship    Stability/Clinical Decision Making  Stable/Uncomplicated    Clinical Decision Making  Low    PT Frequency  2x / week    PT Duration  8 weeks    PT Treatment/Interventions  ADLs/Self Care Home Management;Electrical Stimulation;Moist Heat;Iontophoresis 4mg /ml Dexamethasone;Ultrasound;Therapeutic exercise;Therapeutic activities;Functional mobility training;Neuromuscular re-education;Manual techniques;Dry needling;Taping;Patient/family education  PT Next Visit Plan  cont hip and core strength, CCK hip, modalities for pain    PT Home Exercise Plan   clam, hip abd, ITB and anterior hip stretch (thomas test), hip bridge    Consulted and Agree with Plan of Care  Patient       Patient will benefit from skilled therapeutic intervention in order to improve the following deficits and impairments:  Postural dysfunction, Pain, Impaired flexibility, Increased fascial restricitons, Decreased strength  Visit Diagnosis: Pain in right hip  Pain in left hip  Abnormal posture     Problem List Patient Active Problem List   Diagnosis Date Noted  . MGUS (monoclonal gammopathy of unknown significance) 11/11/2018  . Beta thalassemia minor 08/12/2018  . Obsessive compulsive disorder 02/18/2014  . Major depressive disorder, recurrent episode, moderate ([place]) 05/14/2013  . Major depressive disorder, recurrent episode, severe, without mention of psychotic behavior 03/05/2013  . Major depressive disorder, recurrent episode, in full remission ([place]) 12/04/2012  . Social anxiety disorder 09/17/2012  . ANXIETY DISORDER, GENERALIZED 07/15/2008  . HYPERCHOLESTEROLEMIA 10/07/2007  . VITILIGO 10/07/2007  . ANEMIA, IRON DEFICIENCY, UNSPEC. 01/10/2007    Beaulah Dinning, PT, DPT 10/03/19 12:45 PM  [facility] [address] [place], [place], [address] Phone: [phone]   Fax:  [phone]  Name: Ganelle Berends MRN: [id number] Date of Birth: [birth year]

## 2019-10-17 ENCOUNTER — Other Ambulatory Visit: Payer: Self-pay

## 2019-10-17 ENCOUNTER — Encounter: Payer: Self-pay | Admitting: Physical Therapy

## 2019-10-17 ENCOUNTER — Ambulatory Visit: Payer: BC Managed Care – PPO | Attending: Family Medicine | Admitting: Physical Therapy

## 2019-10-17 DIAGNOSIS — M25552 Pain in left hip: Secondary | ICD-10-CM | POA: Diagnosis not present

## 2019-10-17 DIAGNOSIS — M25551 Pain in right hip: Secondary | ICD-10-CM | POA: Insufficient documentation

## 2019-10-17 DIAGNOSIS — R293 Abnormal posture: Secondary | ICD-10-CM | POA: Insufficient documentation

## 2019-10-17 NOTE — Therapy (Signed)
[place], [place], [address] Phone: [phone]   Fax:  [phone]  Physical Therapy Treatment  Patient Details  Name: Jessica [name] MRN: [id number] Date of Birth: 1971/06/19 Referring Provider (PT): Dr. Delia Chimes   Encounter Date: 10/17/2019  PT End of Session - 10/17/19 1235    Visit Number  3    Number of Visits  8    Date for PT Re-Evaluation  11/21/19    PT Start Time  1130    PT Stop Time  1226    PT Time Calculation (min)  56 min    Activity Tolerance  Patient tolerated treatment well    Behavior During Therapy  [facility] for tasks assessed/performed       Past Medical History:  Diagnosis Date  . Allergy   . Anemia   . Anxiety   . Depression   . Hyperlipidemia     Past Surgical History:  Procedure Laterality Date  . TUBAL LIGATION      There were no vitals filed for this visit.    [facility] Adult PT Treatment/Exercise - 10/17/19 0001      Self-Care   Self-Care  Heat/Ice Application;Other Self-Care Comments    Other Self-Care Comments   tennis ball , foam roller for self release       Lumbar Exercises: Quadruped   Straight Leg Raise  10 reps    Straight Leg Raises Limitations  green band knee bent     Opposite Arm/Leg Raise  Right arm/Left leg;Left arm/Right leg;10 reps    Plank  bear plank  3 x 10       Knee/Hip Exercises: Stretches   Active Hamstring Stretch  Both;1 rep;60 seconds    Active Hamstring Stretch Limitations  strap     Hip Flexor Stretch  Both;1 rep;60 seconds    ITB Stretch  Right;Left;1 rep;60 seconds    ITB Stretch Limitations  strap     Piriformis Stretch  Right;Left;3 reps;30 seconds    Piriformis Stretch Limitations  seated and supine       Knee/Hip Exercises: Aerobic   Elliptical  5 min L3 ramp L1 resist.       Knee/Hip Exercises: Supine   Bridges  Strengthening;Both;1 set;10 reps    Bridges with Clamshell  Strengthening;Both;1 set;10 reps   blue  band      Knee/Hip Exercises: Sidelying   Hip ABduction  Strengthening;Both;1 set;10 reps    Hip ABduction Limitations  green band     Clams  x 15 green band       Iontophoresis   Type of Iontophoresis  Dexamethasone    Location  L glute med    Dose  1 cc     Time  6 hour patch                PT Short Term Goals - 09/26/19 1934      PT SHORT TERM GOAL #1   Title  Pt will be I with HEP for hips, core    Time  4    Period  Weeks    Status  New    Target Date  10/24/19      PT SHORT TERM GOAL #2   Title  Pt will be able to sit crossed legged on the floor without increased pain in hips.    Time  4    Period  Weeks    Status  New    Target  Date  10/24/19      PT SHORT TERM GOAL #3   Title  Pt will report 30 min walks 2-3 times per week for general health and fitness.    Time  4    Period  Weeks    Status  New    Target Date  10/24/19        PT Long Term Goals - 09/26/19 1935      PT LONG TERM GOAL #1   Title  Pt will be able to lie on each side at night without increasing pain, less waking from discomfort    Time  8    Period  Weeks    Status  New    Target Date  11/21/19      PT LONG TERM GOAL #2   Title  Pt will demo increased lateral hip strength (5/5 ) in abduction for maximal joint support.    Time  8    Period  Weeks    Status  New    Target Date  11/21/19      PT LONG TERM GOAL #3   Title  Pt will squat with good LE alignment, no cues to show proper muscle activation through biomechanical chain    Time  8    Period  Weeks    Status  New    Target Date  11/21/19      PT LONG TERM GOAL #4   Title  Pt will be I with HEP for hips, core upon discharge    Time  8    Period  Weeks    Target Date  11/21/19            Plan - 10/17/19 1236    Clinical Impression Statement  Patient is doing well and reporting less pain overall in hips, some in L hip but much improved.  Shows good body awareness and core strength with exercises.  Needs cues  to control ROM (is flexible) and enage core.  Trial of ionto L hip for inflammation.    PT Treatment/Interventions  ADLs/Self Care Home Management;Electrical Stimulation;Moist Heat;Iontophoresis 4mg /ml Dexamethasone;Ultrasound;Therapeutic exercise;Therapeutic activities;Functional mobility training;Neuromuscular re-education;Manual techniques;Dry needling;Taping;Patient/family education    PT Next Visit Plan  cont hip and core strength, CCK hip, modalities for pain    PT Home Exercise Plan  clam, hip abd, ITB and anterior hip stretch (thomas test), hip bridge, add clam and bird dog    Consulted and Agree with Plan of Care  Patient       Patient will benefit from skilled therapeutic intervention in order to improve the following deficits and impairments:  Postural dysfunction, Pain, Impaired flexibility, Increased fascial restricitons, Decreased strength  Visit Diagnosis: Pain in right hip  Pain in left hip  Abnormal posture     Problem List Patient Active Problem List   Diagnosis Date Noted  . MGUS (monoclonal gammopathy of unknown significance) 11/11/2018  . Beta thalassemia minor 08/12/2018  . Obsessive compulsive disorder 02/18/2014  . Major depressive disorder, recurrent episode, moderate ([place]) 05/14/2013  . Major depressive disorder, recurrent episode, severe, without mention of psychotic behavior 03/05/2013  . Major depressive disorder, recurrent episode, in full remission ([place]) 12/04/2012  . Social anxiety disorder 09/17/2012  . ANXIETY DISORDER, GENERALIZED 07/15/2008  . HYPERCHOLESTEROLEMIA 10/07/2007  . VITILIGO 10/07/2007  . ANEMIA, IRON DEFICIENCY, UNSPEC. 01/10/2007    PAA,JENNIFER 10/17/2019, 12:40 PM  [facility] [address] [place], [place], [address] Phone: [phone]  Fax:  [phone]  Name: Jessica [name] MRN: [id number] Date of Birth: 1971/02/13  Raeford Razor, PT 10/17/19 12:40 PM Phone:  [phone] Fax: [phone]

## 2019-10-17 NOTE — Patient Instructions (Signed)
Prepared By: [place], [place]  Phone: [phone]  Step 1  Step 2  Bird Dog reps: 10  sets: 2  hold: 5  daily: 1  weekly: 7 Setup  Begin on all fours, with your arms positioned directly under your shoulders. Movement  Straighten one arm and your opposite leg at the same time, until they are parallel to the floor. Hold briefly, then return to the starting position. Tip  Make sure to keep your abdominals tight and hips level during the exercise. Step 1  Step 2  Bridge with Abduction and Resistance Loop reps: 10  sets: 2  hold: 5  daily: 1  weekly: 7 Setup  Begin lying on your back with your legs bent, feet on the bed, and a resistance loop around your legs. Movement  Tighten your abdominals, lift your hips off the bed, and move your legs apart at the same time. Hold, then return to the bed and repeat. Tip  Make sure to continue breathing evenly during the exercise.    IONTOPHORESIS PATIENT PRECAUTIONS & CONTRAINDICATIONS:  . Redness under one or both electrodes can occur.  This characterized by a uniform redness that usually disappears within 12 hours of treatment. . Small pinhead size blisters may result in response to the drug.  Contact your physician if the problem persists more than 24 hours. . On rare occasions, iontophoresis therapy can result in temporary skin reactions such as rash, inflammation, irritation or burns.  The skin reactions may be the result of individual sensitivity to the ionic solution used, the condition of the skin at the start of treatment, reaction to the materials in the electrodes, allergies or sensitivity to dexamethasone, or a poor connection between the patch and your skin.  Discontinue using iontophoresis if you have any of these reactions and report to your therapist. . Remove the Patch or electrodes if you have any undue sensation of pain or burning during the treatment and report discomfort to your therapist.  . Tell your Therapist if you have had known adverse reactions to the application of electrical current. . If using the Patch, the LED light will turn off when treatment is complete and the patch can be removed.  Approximate treatment time is 1-3 hours.  Remove the patch when light goes off or after 6 hours. . The Patch can be worn during normal activity, however excessive motion where the electrodes have been placed can cause poor contact between the skin and the electrode or uneven electrical current resulting in greater risk of skin irritation. Marland Kitchen Keep out of the reach of children.   . DO NOT use if you have a cardiac pacemaker or any other electrically sensitive implanted device. . DO NOT use if you have a known sensitivity to dexamethasone. . DO NOT use during Magnetic Resonance Imaging (MRI). . DO NOT use over broken or compromised skin (e.g. sunburn, cuts, or acne) due to the increased risk of skin reaction. . DO NOT SHAVE over the area to be treated:  To establish good contact between the Patch and the skin, excessive hair may be clipped. . DO NOT place the Patch or electrodes on or over your eyes, directly over your heart, or brain. . DO NOT reuse the Patch or electrodes as this may cause burns to occur.

## 2019-10-24 ENCOUNTER — Other Ambulatory Visit: Payer: Self-pay

## 2019-10-24 ENCOUNTER — Encounter: Payer: Self-pay | Admitting: Physical Therapy

## 2019-10-24 ENCOUNTER — Ambulatory Visit: Payer: BC Managed Care – PPO | Admitting: Physical Therapy

## 2019-10-24 DIAGNOSIS — R293 Abnormal posture: Secondary | ICD-10-CM

## 2019-10-24 DIAGNOSIS — M25551 Pain in right hip: Secondary | ICD-10-CM

## 2019-10-24 DIAGNOSIS — M25552 Pain in left hip: Secondary | ICD-10-CM

## 2019-10-24 NOTE — Therapy (Signed)
Jessica [name], [place], [address] Phone: [phone]   Fax:  [phone]  Physical Therapy Treatment/Discharge  Patient Details  Name: Jessica [name] MRN: [id number] Date of Birth: 25-Dec-1970 Referring Provider (PT): Dr. Delia [name]   Encounter Date: 10/24/2019  PT End of Session - 10/24/19 1143    Visit Number  4    Number of Visits  8    Date for PT Re-Evaluation  11/21/19    PT Start Time  7412    PT Stop Time  1207    PT Time Calculation (min)  32 min    Activity Tolerance  Patient tolerated treatment well    Behavior During Therapy  [facility] for tasks assessed/performed       Past Medical History:  Diagnosis Date  . Allergy   . Anemia   . Anxiety   . Depression   . Hyperlipidemia     Past Surgical History:  Procedure Laterality Date  . TUBAL LIGATION      There were no vitals filed for this visit.  Subjective Assessment - 10/24/19 1141    Subjective  Tightness on the L side is getting better.  Very mild pain overall. None right now.I think I can do this on my own.    Currently in Pain?  No/denies        [facility] Adult PT Treatment/Exercise - 10/24/19 0001      Lumbar Exercises: Quadruped   Opposite Arm/Leg Raise  Right arm/Left leg;Left arm/Right leg;10 reps    Plank  bear plank  3 x 10       Knee/Hip Exercises: Stretches   Piriformis Stretch  Right;Left;3 reps;30 seconds      Knee/Hip Exercises: Aerobic   Nustep  L6 x 5 min LE only      Knee/Hip Exercises: Standing   SLS with Vectors  hip abduction on foam x 15     Other Standing Knee Exercises  hip hinge single leg    on foam x 10 each leg      Knee/Hip Exercises: Sidelying   Hip ABduction  Strengthening;Both;1 set;10 reps    Clams  x 15 blue band, on side and then on elbow               PT Education - 10/24/19 1147    Education Details  DC , final HEP, ball exercises    Person(s) Educated  Patient    Methods   Explanation;Demonstration    Comprehension  Verbalized understanding       PT Short Term Goals - 10/24/19 1148      PT SHORT TERM GOAL #1   Title  Pt will be I with HEP for hips, core    Status  Achieved      PT SHORT TERM GOAL #2   Title  Pt will be able to sit crossed legged on the floor without increased pain in hips.    Status  Achieved      PT SHORT TERM GOAL #3   Title  Pt will report 30 min walks 2-3 times per week for general health and fitness.    Baseline  has not done    Status  Not Met        PT Long Term Goals - 10/24/19 1148      PT LONG TERM GOAL #1   Title  Pt will be able to lie on each side at night without increasing pain, less waking  from discomfort    Status  Achieved      PT LONG TERM GOAL #2   Title  Pt will demo increased lateral hip strength (5/5 ) in abduction for maximal joint support.    Status  Achieved      PT LONG TERM GOAL #3   Title  Pt will squat with good LE alignment, no cues to show proper muscle activation through biomechanical chain    Status  Achieved      PT LONG TERM GOAL #4   Title  Pt will be I with HEP for hips, core upon discharge    Status  Achieved            Plan - 10/24/19 1844    Clinical Impression Statement  Pt has met all goals. She is 1% limited per FOTO. No longer has hip tightness.   Very strong core and hips noted today.  DC from PT.    PT Treatment/Interventions  ADLs/Self Care Home Management;Electrical Stimulation;Moist Heat;Iontophoresis 52m/ml Dexamethasone;Ultrasound;Therapeutic exercise;Therapeutic activities;Functional mobility training;Neuromuscular re-education;Manual techniques;Dry needling;Taping;Patient/family education    PT Next Visit Plan  DC, NA    PT Home Exercise Plan  clam, hip abd, ITB and anterior hip stretch (thomas test), hip bridge, add clam and bird dog       Patient will benefit from skilled therapeutic intervention in order to improve the following deficits and impairments:   Postural dysfunction, Pain, Impaired flexibility, Increased fascial restricitons, Decreased strength  Visit Diagnosis: Pain in left hip  Abnormal posture  Pain in right hip     Problem List Patient Active Problem List   Diagnosis Date Noted  . MGUS (monoclonal gammopathy of unknown significance) 11/11/2018  . Beta thalassemia minor 08/12/2018  . Obsessive compulsive disorder 02/18/2014  . Major depressive disorder, recurrent episode, moderate ([place] 05/14/2013  . Major depressive disorder, recurrent episode, severe, without mention of psychotic behavior 03/05/2013  . Major depressive disorder, [place] episode, in full remission ([place] 12/04/2012  . Social anxiety disorder 09/17/2012  . ANXIETY DISORDER, GENERALIZED 07/15/2008  . HYPERCHOLESTEROLEMIA 10/07/2007  . VITILIGO 10/07/2007  . ANEMIA, IRON DEFICIENCY, UNSPEC. 01/10/2007    , 10/24/2019, 6:46 PM  [facility] [place] [address]Phone: [phone]  Fax:  [phone] Name: Jessica CobinMRN: [id number] of Birth: 206/02/1971 JRaeford [name] PT 10/24/19 6:47 PM Phone: [phone]: [phone]

## 2019-10-24 NOTE — Patient Instructions (Signed)
Access Code: [email or web address]  URL: [email or web address]  Date: 10/24/2019  Prepared by: Raeford Razor   Exercises Bridge with Heels on [employer] - 10 reps - 2 sets - 10 hold - 1x daily - 7x weekly Sidelying Feet Elevated Clamshells - 10 reps - 2 sets - 5 hold - 1x daily - 7x weekly

## 2019-10-31 ENCOUNTER — Ambulatory Visit: Payer: BC Managed Care – PPO | Admitting: Physical Therapy

## 2019-11-17 ENCOUNTER — Telehealth: Payer: Self-pay | Admitting: Family Medicine

## 2019-11-17 ENCOUNTER — Other Ambulatory Visit: Payer: Self-pay | Admitting: Family Medicine

## 2019-11-17 DIAGNOSIS — Z1231 Encounter for screening mammogram for malignant neoplasm of breast: Secondary | ICD-10-CM

## 2019-11-17 MED ORDER — VENLAFAXINE HCL ER 37.5 MG PO CP24: 37.5000 mg | ORAL_CAPSULE | Freq: Every day | ORAL | 0 refills | Status: DC

## 2019-11-17 NOTE — Telephone Encounter (Signed)
venlafaxine (EFFEXOR) 25 MG tablet [id number] DISCONTINUED    [id number] - [place], [place] - [address]  Pt requesting refill , says that Dr.Stalling told her to call when she was ready to refill medication

## 2019-11-17 NOTE — Telephone Encounter (Signed)
Please advise 

## 2020-01-05 ENCOUNTER — Other Ambulatory Visit: Payer: Self-pay

## 2020-01-05 ENCOUNTER — Ambulatory Visit
Admission: RE | Admit: 2020-01-05 | Discharge: 2020-01-05 | Disposition: A | Discharge status: Home / Self Care | Payer: BC Managed Care – PPO | Source: Ambulatory Visit | Attending: Family Medicine | Admitting: Family Medicine

## 2020-01-05 DIAGNOSIS — Z1231 Encounter for screening mammogram for malignant neoplasm of breast: Secondary | ICD-10-CM | POA: Diagnosis not present

## 2020-01-08 ENCOUNTER — Other Ambulatory Visit: Payer: Self-pay | Admitting: Family Medicine

## 2020-01-31 ENCOUNTER — Other Ambulatory Visit: Payer: Self-pay | Admitting: Family Medicine

## 2020-03-02 ENCOUNTER — Other Ambulatory Visit: Payer: Self-pay

## 2020-03-02 ENCOUNTER — Inpatient Hospital Stay: Payer: BC Managed Care – PPO | Attending: Internal Medicine

## 2020-03-02 DIAGNOSIS — D509 Iron deficiency anemia, unspecified: Secondary | ICD-10-CM | POA: Insufficient documentation

## 2020-03-02 DIAGNOSIS — D500 Iron deficiency anemia secondary to blood loss (chronic): Secondary | ICD-10-CM

## 2020-03-02 DIAGNOSIS — Z79899 Other long term (current) drug therapy: Secondary | ICD-10-CM | POA: Insufficient documentation

## 2020-03-02 DIAGNOSIS — D472 Monoclonal gammopathy: Secondary | ICD-10-CM | POA: Insufficient documentation

## 2020-03-02 DIAGNOSIS — D563 Thalassemia minor: Secondary | ICD-10-CM | POA: Diagnosis not present

## 2020-03-02 LAB — CBC WITH DIFFERENTIAL (CANCER CENTER ONLY)
Abs Immature Granulocytes: 0.02 10*3/uL (ref 0.00–0.07)
Basophils Absolute: 0.1 10*3/uL (ref 0.0–0.1)
Basophils Relative: 1 %
Eosinophils Absolute: 0.1 10*3/uL (ref 0.0–0.5)
Eosinophils Relative: 1 %
HCT: 36 % (ref 36.0–46.0)
Hemoglobin: 11 g/dL — ABNORMAL LOW (ref 12.0–15.0)
Immature Granulocytes: 0 %
Lymphocytes Relative: 26 %
Lymphs Abs: 1.4 10*3/uL (ref 0.7–4.0)
MCH: 23 pg — ABNORMAL LOW (ref 26.0–34.0)
MCHC: 30.6 g/dL (ref 30.0–36.0)
MCV: 75.2 fL — ABNORMAL LOW (ref 80.0–100.0)
Monocytes Absolute: 0.7 10*3/uL (ref 0.1–1.0)
Monocytes Relative: 12 %
Neutro Abs: 3.3 10*3/uL (ref 1.7–7.7)
Neutrophils Relative %: 60 %
Platelet Count: 282 10*3/uL (ref 150–400)
RBC: 4.79 MIL/uL (ref 3.87–5.11)
RDW: 15.9 % — ABNORMAL HIGH (ref 11.5–15.5)
WBC Count: 5.6 10*3/uL (ref 4.0–10.5)
nRBC: 0 % (ref 0.0–0.2)

## 2020-03-02 LAB — CMP (CANCER CENTER ONLY)
ALT: 10 U/L (ref 0–44)
AST: 14 U/L — ABNORMAL LOW (ref 15–41)
Albumin: 3.9 g/dL (ref 3.5–5.0)
Alkaline Phosphatase: 59 U/L (ref 38–126)
Anion gap: 9 (ref 5–15)
BUN: 14 mg/dL (ref 6–20)
CO2: 28 mmol/L (ref 22–32)
Calcium: 9.4 mg/dL (ref 8.9–10.3)
Chloride: 104 mmol/L (ref 98–111)
Creatinine: 1.05 mg/dL — ABNORMAL HIGH (ref 0.44–1.00)
GFR, Est AFR Am: >60 mL/min (ref >60)
GFR, Estimated: >60 mL/min (ref >60)
Glucose, Bld: 112 mg/dL — ABNORMAL HIGH (ref 70–99)
Potassium: 4.1 mmol/L (ref 3.5–5.1)
Sodium: 141 mmol/L (ref 135–145)
Total Bilirubin: 0.3 mg/dL (ref 0.3–1.2)
Total Protein: 8 g/dL (ref 6.5–8.1)

## 2020-03-02 LAB — IRON AND TIBC
Iron: 32 ug/dL — ABNORMAL LOW (ref 41–142)
Saturation Ratios: 13 % — ABNORMAL LOW (ref 21–57)
TIBC: 235 ug/dL — ABNORMAL LOW (ref 236–444)
UIBC: 203 ug/dL (ref 120–384)

## 2020-03-02 LAB — FERRITIN: Ferritin: 228 ng/mL (ref 11–307)

## 2020-03-02 LAB — LACTATE DEHYDROGENASE: LDH: 142 U/L (ref 98–192)

## 2020-03-03 LAB — IGG, IGA, IGM
IgA: 272 mg/dL (ref 87–352)
IgG (Immunoglobin G), Serum: 1558 mg/dL (ref 586–1602)
IgM (Immunoglobulin M), Srm: 178 mg/dL (ref 26–217)

## 2020-03-03 LAB — KAPPA/LAMBDA LIGHT CHAINS
Kappa free light chain: 65.5 mg/L — ABNORMAL HIGH (ref 3.3–19.4)
Kappa, lambda light chain ratio: 3.74 — ABNORMAL HIGH (ref 0.26–1.65)
Lambda free light chains: 17.5 mg/L (ref 5.7–26.3)

## 2020-03-03 LAB — BETA 2 MICROGLOBULIN, SERUM: Beta-2 Microglobulin: 2 mg/L (ref 0.6–2.4)

## 2020-03-08 ENCOUNTER — Telehealth: Payer: Self-pay | Admitting: Family Medicine

## 2020-03-08 NOTE — Telephone Encounter (Signed)
Pt needs lab orders placed for her upcoming cpe on 06/21/20. She needs these orders placed for her blood work for a nurse visit on 06/17/20.

## 2020-03-09 ENCOUNTER — Encounter: Payer: Self-pay | Admitting: Internal Medicine

## 2020-03-09 ENCOUNTER — Telehealth: Payer: Self-pay | Admitting: Internal Medicine

## 2020-03-09 ENCOUNTER — Inpatient Hospital Stay: Payer: BC Managed Care – PPO | Admitting: Internal Medicine

## 2020-03-09 ENCOUNTER — Other Ambulatory Visit: Payer: Self-pay

## 2020-03-09 VITALS — BP 141/79 | HR 94 | Temp 98.7°F | Resp 18 | Ht 67.0 in | Wt 155.4 lb

## 2020-03-09 DIAGNOSIS — D500 Iron deficiency anemia secondary to blood loss (chronic): Secondary | ICD-10-CM

## 2020-03-09 DIAGNOSIS — D563 Thalassemia minor: Secondary | ICD-10-CM | POA: Diagnosis not present

## 2020-03-09 DIAGNOSIS — D509 Iron deficiency anemia, unspecified: Secondary | ICD-10-CM | POA: Diagnosis not present

## 2020-03-09 DIAGNOSIS — D472 Monoclonal gammopathy: Secondary | ICD-10-CM | POA: Diagnosis not present

## 2020-03-09 DIAGNOSIS — Z79899 Other long term (current) drug therapy: Secondary | ICD-10-CM | POA: Diagnosis not present

## 2020-03-09 NOTE — Progress Notes (Signed)
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  PRIOR THERAPY: Feraheme infusion on as-needed basis  CURRENT THERAPY: None  INTERVAL HISTORY: Jessica [name] 49 y.o. female returns to the clinic today for 6 months follow-up visit.  The patient is feeling fine today with no concerning complaints except for persistent fatigue.  She denied having any current chest pain, shortness of breath, cough or hemoptysis.  She denied having any fever or chills.  She has no nausea, vomiting, diarrhea or constipation.  She denied having any headache or visual changes.  She is here today for evaluation with repeat CBC, iron study and ferritin in addition to myeloma panel.  MEDICAL HISTORY: Past Medical History:  Diagnosis Date  . Allergy   . Anemia   . Anxiety   . Depression   . Hyperlipidemia     ALLERGIES:  is allergic to sulfonamide derivatives.  MEDICATIONS:  Current Outpatient Medications  Medication Sig Dispense Refill  . atorvastatin (LIPITOR) 20 MG tablet Take 1 tablet (20 mg total) by mouth daily. 90 tablet 3  . traZODone (DESYREL) 50 MG tablet TAKE 0.5-1 TABLETS (25-50 MG TOTAL) BY MOUTH AT BEDTIME AS NEEDED FOR SLEEP. 90 tablet 1  . venlafaxine XR (EFFEXOR XR) 37.5 MG 24 hr capsule Take 1 capsule (37.5 mg total) by mouth daily with breakfast. 90 capsule 0   No current facility-administered medications for this visit.    SURGICAL HISTORY:  Past Surgical History:  Procedure Laterality Date  . TUBAL LIGATION      REVIEW OF SYSTEMS:  A comprehensive review of systems was negative except for: Constitutional: positive for fatigue   PHYSICAL EXAMINATION: General appearance: alert,  cooperative and no distress Head: Normocephalic, without obvious abnormality, atraumatic Neck: no adenopathy, no JVD, supple, symmetrical, trachea midline and thyroid not enlarged, symmetric, no tenderness/mass/nodules Lymph nodes: Cervical, supraclavicular, and axillary nodes normal. Resp: clear to auscultation bilaterally Back: symmetric, no curvature. ROM normal. No CVA tenderness. Cardio: regular rate and rhythm, S1, S2 normal, no murmur, click, rub or gallop GI: soft, non-tender; bowel sounds normal; no masses,  no organomegaly Extremities: extremities normal, atraumatic, no cyanosis or edema  ECOG PERFORMANCE STATUS: 1 - Symptomatic but completely ambulatory  Blood pressure (!) 141/79, pulse 94, temperature 98.7 F (37.1 C), resp. rate 18, height _0  (1.702 m), weight 155 lb 6.4 oz (70.5 kg), SpO2 100 %.  LABORATORY DATA: Lab Results  Component Value Date   WBC 5.6 03/02/2020   HGB 11.0 (L) 03/02/2020   HCT 36.0 03/02/2020   MCV 75.2 (L) 03/02/2020   PLT 282 03/02/2020      Chemistry      Component Value Date/Time   NA 141 03/02/2020 1020   NA 141 04/11/2018 1752   K 4.1 03/02/2020 1020   CL 104 03/02/2020 1020   CO2 28 03/02/2020 1020   BUN 14 03/02/2020 1020   BUN 11 04/11/2018 1752   CREATININE 1.05 (H) 03/02/2020 1020   CREATININE 0.98 09/10/2017 1333      Component Value Date/Time   CALCIUM 9.4 03/02/2020 1020   ALKPHOS 59 03/02/2020 1020   AST 14 (L) 03/02/2020 1020   ALT 10 03/02/2020 1020   BILITOT 0.3 03/02/2020 1020  RADIOGRAPHIC STUDIES: No results found.  ASSESSMENT AND PLAN: This is a very pleasant 49 years old African-American female presented for evaluation of microcytic hypochromic anemia likely secondary to beta thalassemia minor as well as iron deficiency.   She was treated in the past with Feraheme infusion and felt a little bit better. The patient has been in observation and feeling well with no concerning complaints except for the  persistent fatigue secondary to iron deficiency. She had repeat iron study performed recently that showed low serum iron and iron saturation.  Her ferritin level is normal but declining. The patient also has a myeloma panel that was unremarkable for any changes. I recommended for the patient to proceed with Feraheme infusion for 2 weeks.  I will see her back for follow-up visit in 6 months for evaluation with repeat CBC, iron study and ferritin. She was advised to call immediately if she has any concerning symptoms in the interval. The patient voices understanding of current disease status and treatment options and is in agreement with the current care plan.  All questions were answered. The patient knows to call the clinic with any problems, questions or concerns. We can certainly see the patient much sooner if necessary.  Disclaimer: This note was dictated with voice recognition software. Similar sounding words can inadvertently be transcribed and may not be corrected upon review.

## 2020-03-09 NOTE — Telephone Encounter (Signed)
Scheduled per los. Gave avs and calendar  

## 2020-03-09 NOTE — Telephone Encounter (Signed)
Please place labs for upcoming appt.

## 2020-03-12 ENCOUNTER — Inpatient Hospital Stay: Payer: BC Managed Care – PPO

## 2020-03-12 ENCOUNTER — Other Ambulatory Visit: Payer: Self-pay

## 2020-03-12 VITALS — BP 123/83 | HR 71 | Temp 97.9°F | Resp 18

## 2020-03-12 DIAGNOSIS — D500 Iron deficiency anemia secondary to blood loss (chronic): Secondary | ICD-10-CM

## 2020-03-12 DIAGNOSIS — D563 Thalassemia minor: Secondary | ICD-10-CM | POA: Diagnosis not present

## 2020-03-12 DIAGNOSIS — D472 Monoclonal gammopathy: Secondary | ICD-10-CM | POA: Diagnosis not present

## 2020-03-12 DIAGNOSIS — Z79899 Other long term (current) drug therapy: Secondary | ICD-10-CM | POA: Diagnosis not present

## 2020-03-12 DIAGNOSIS — D509 Iron deficiency anemia, unspecified: Secondary | ICD-10-CM | POA: Diagnosis not present

## 2020-03-12 MED ORDER — SODIUM CHLORIDE 0.9 % IV SOLN
510.0000 mg | Freq: Once | INTRAVENOUS | Status: AC
  Administered 2020-03-12: 510 mg via INTRAVENOUS
  Filled 2020-03-12: qty 510

## 2020-03-12 MED ORDER — ACETAMINOPHEN 325 MG PO TABS
650.0000 mg | ORAL_TABLET | Freq: Once | ORAL | Status: AC
  Administered 2020-03-12: 650 mg via ORAL

## 2020-03-12 MED ORDER — SODIUM CHLORIDE 0.9 % IV SOLN
Freq: Once | INTRAVENOUS | Status: AC
  Filled 2020-03-12: qty 250

## 2020-03-12 MED ORDER — ACETAMINOPHEN 325 MG PO TABS
ORAL_TABLET | ORAL | Status: AC
  Filled 2020-03-12: qty 2

## 2020-03-12 NOTE — Patient Instructions (Signed)

## 2020-03-19 ENCOUNTER — Inpatient Hospital Stay: Payer: BC Managed Care – PPO | Attending: Internal Medicine

## 2020-03-19 ENCOUNTER — Other Ambulatory Visit: Payer: Self-pay

## 2020-03-19 VITALS — BP 152/85 | HR 70 | Temp 97.9°F | Resp 18

## 2020-03-19 DIAGNOSIS — D509 Iron deficiency anemia, unspecified: Secondary | ICD-10-CM | POA: Insufficient documentation

## 2020-03-19 DIAGNOSIS — D500 Iron deficiency anemia secondary to blood loss (chronic): Secondary | ICD-10-CM

## 2020-03-19 MED ORDER — ACETAMINOPHEN 325 MG PO TABS
ORAL_TABLET | ORAL | Status: AC
  Filled 2020-03-19: qty 2

## 2020-03-19 MED ORDER — SODIUM CHLORIDE 0.9 % IV SOLN
510.0000 mg | Freq: Once | INTRAVENOUS | Status: AC
  Administered 2020-03-19: 510 mg via INTRAVENOUS
  Filled 2020-03-19: qty 510

## 2020-03-19 MED ORDER — ACETAMINOPHEN 325 MG PO TABS
650.0000 mg | ORAL_TABLET | Freq: Once | ORAL | Status: AC
  Administered 2020-03-19: 650 mg via ORAL

## 2020-03-19 MED ORDER — ACETAMINOPHEN 325 MG PO TABS
ORAL_TABLET | ORAL | Status: AC
  Filled 2020-03-19: qty 1

## 2020-03-19 MED ORDER — SODIUM CHLORIDE 0.9 % IV SOLN
Freq: Once | INTRAVENOUS | Status: AC
  Filled 2020-03-19: qty 250

## 2020-03-19 NOTE — Patient Instructions (Signed)

## 2020-03-30 ENCOUNTER — Other Ambulatory Visit: Payer: Self-pay | Admitting: Family Medicine

## 2020-03-30 ENCOUNTER — Ambulatory Visit: Payer: BC Managed Care – PPO | Admitting: Registered Nurse

## 2020-03-30 ENCOUNTER — Other Ambulatory Visit: Payer: Self-pay

## 2020-03-30 VITALS — BP 147/82 | HR 81 | Temp 98.2°F | Resp 14 | Ht 67.0 in | Wt 158.8 lb

## 2020-03-30 DIAGNOSIS — M545 Low back pain, unspecified: Secondary | ICD-10-CM

## 2020-03-30 LAB — POCT URINALYSIS DIP (MANUAL ENTRY)
Bilirubin, UA: NEGATIVE
Blood, UA: NEGATIVE
Glucose, UA: NEGATIVE mg/dL
Ketones, POC UA: NEGATIVE mg/dL
Leukocytes, UA: NEGATIVE
Nitrite, UA: NEGATIVE
Protein Ur, POC: NEGATIVE mg/dL
Spec Grav, UA: 1.025 (ref 1.010–1.025)
Urobilinogen, UA: 0.2 E.U./dL
pH, UA: 5.5 (ref 5.0–8.0)

## 2020-03-30 MED ORDER — METHOCARBAMOL 500 MG PO TABS: 500.0000 mg | ORAL_TABLET | Freq: Four times a day (QID) | ORAL | 0 refills | Status: DC

## 2020-03-30 MED ORDER — MELOXICAM 15 MG PO TABS: 15.0000 mg | ORAL_TABLET | Freq: Every day | ORAL | 0 refills | Status: DC

## 2020-03-30 NOTE — Progress Notes (Signed)
Established Patient Office Visit  Subjective:  Patient ID: Jessica [name], female    DOB: 1970-12-08  Age: 49 y.o. MRN: [id number]  CC:  Chief Complaint  Patient presents with  . Back Pain    pt complains of Lower RT back pain last throughout the day, ibuprofen offers no relief, started saturday, no Hxt of urinary issues or kidney stones,     HPI Jessica [name] presents for right lower back pain. Sudden onset on waking on Sunday. No heavy lifting, abnormal activities, or acute injury noted. Denies urinary or bowel movement irregularities or symptoms. Pain does not involve spine or hip - located superficially lower right back Worse with movement, worse with stretching, bending, exercise.   Otherwise, no concerns. Has had similar pain before as well as trochanteric bursitis, for which she was given antiinflammatories and PT with good effect.   Past Medical History:  Diagnosis Date  . Allergy   . Anemia   . Anxiety   . Depression   . Hyperlipidemia     Past Surgical History:  Procedure Laterality Date  . TUBAL LIGATION      Family History  Problem Relation Age of Onset  . Alcohol abuse Father   . Drug abuse Father   . Hyperlipidemia Father   . Hypertension Father   . Alcohol abuse Maternal Uncle   . Heart disease Mother   . Hyperlipidemia Mother   . Hypertension Mother   . Heart disease Maternal Grandmother   . Hypertension Maternal Grandmother   . Hyperlipidemia Maternal Grandmother   . Cancer Paternal Grandmother   . Diabetes Paternal Grandmother   . Hypertension Paternal Grandmother   . Cancer Paternal Grandfather   . Breast cancer Neg Hx     Social History   Socioeconomic History  . Marital status: Married    Spouse name: Not on file  . Number of children: Not on file  . Years of education: Not on file  . Highest education level: Not on file  Occupational History  . Not on file  Tobacco Use  . Smoking status: Never Smoker  . Smokeless tobacco:  Never Used  Substance and Sexual Activity  . Alcohol use: No    Alcohol/week: 0.0 standard drinks  . Drug use: No  . Sexual activity: Yes  Other Topics Concern  . Not on file  Social History Narrative  . Not on file   Social Determinants of Health   Financial Resource Strain:   . Difficulty of Paying Living Expenses:   Food Insecurity:   . Worried About Charity fundraiser in the Last Year:   . Arboriculturist in the Last Year:   Transportation Needs:   . Film/video editor (Medical):   Marland Kitchen Lack of Transportation (Non-Medical):   Physical Activity:   . Days of Exercise per Week:   . Minutes of Exercise per Session:   Stress:   . Feeling of Stress :   Social Connections:   . Frequency of Communication with Friends and Family:   . Frequency of Social Gatherings with Friends and Family:   . Attends Religious Services:   . Active Member of Clubs or Organizations:   . Attends Archivist Meetings:   Marland Kitchen Marital Status:   Intimate Partner Violence:   . Fear of Current or Ex-Partner:   . Emotionally Abused:   Marland Kitchen Physically Abused:   . Sexually Abused:     Outpatient Medications Prior to Visit  Medication  Sig Dispense Refill  . atorvastatin (LIPITOR) 20 MG tablet Take 1 tablet (20 mg total) by mouth daily. 90 tablet 3  . traZODone (DESYREL) 50 MG tablet TAKE 0.5-1 TABLETS (25-50 MG TOTAL) BY MOUTH AT BEDTIME AS NEEDED FOR SLEEP. 90 tablet 1  . venlafaxine XR (EFFEXOR XR) 37.5 MG 24 hr capsule Take 1 capsule (37.5 mg total) by mouth daily with breakfast. 90 capsule 0   No facility-administered medications prior to visit.    Allergies  Allergen Reactions  . Sulfonamide Derivatives     REACTION: hives    ROS Review of Systems  Constitutional: Negative.   HENT: Negative.   Eyes: Negative.   Respiratory: Negative.   Cardiovascular: Negative.   Gastrointestinal: Negative.   Endocrine: Negative.   Genitourinary: Negative.   Musculoskeletal: Positive for back  pain and myalgias. Negative for arthralgias, gait problem, joint swelling, neck pain and neck stiffness.  Skin: Negative.   Allergic/Immunologic: Negative.   Neurological: Negative.   Hematological: Negative.   Psychiatric/Behavioral: Negative.   All other systems reviewed and are negative.     Objective:    Physical Exam  Constitutional: She is oriented to person, place, and time. She appears well-developed and well-nourished. No distress.  Cardiovascular: Normal rate and regular rhythm.  Pulmonary/Chest: Effort normal. No respiratory distress.  Musculoskeletal:        General: Tenderness present. No deformity or edema. Normal range of motion.  Neurological: She is alert and oriented to person, place, and time. No cranial nerve deficit. Coordination normal.  Skin: Skin is warm and dry. No rash noted. She is not diaphoretic. No erythema. No pallor.  Psychiatric: She has a normal mood and affect. Her behavior is normal. Judgment and thought content normal.  Nursing note and vitals reviewed.   BP (!) 147/82   Pulse 81   Temp 98.2 F (36.8 C) (Temporal)   Resp 14   Ht 5\' 7"  (1.702 m)   Wt 158 lb 12.8 oz (72 kg)   SpO2 100%   BMI 24.87 kg/m  Wt Readings from Last 3 Encounters:  03/30/20 158 lb 12.8 oz (72 kg)  03/09/20 155 lb 6.4 oz (70.5 kg)  09/10/19 154 lb 12.8 oz (70.2 kg)     There are no preventive care reminders to display for this patient.  There are no preventive care reminders to display for this patient.  Lab Results  Component Value Date   TSH 1.58 06/16/2019   Lab Results  Component Value Date   WBC 5.6 03/02/2020   HGB 11.0 (L) 03/02/2020   HCT 36.0 03/02/2020   MCV 75.2 (L) 03/02/2020   PLT 282 03/02/2020   Lab Results  Component Value Date   NA 141 03/02/2020   K 4.1 03/02/2020   CO2 28 03/02/2020   GLUCOSE 112 (H) 03/02/2020   BUN 14 03/02/2020   CREATININE 1.05 (H) 03/02/2020   BILITOT 0.3 03/02/2020   ALKPHOS 59 03/02/2020   AST 14 (L)  03/02/2020   ALT 10 03/02/2020   PROT 8.0 03/02/2020   ALBUMIN 3.9 03/02/2020   CALCIUM 9.4 03/02/2020   ANIONGAP 9 03/02/2020   Lab Results  Component Value Date   CHOL 265 (H) 06/16/2019   Lab Results  Component Value Date   HDL 70 06/16/2019   Lab Results  Component Value Date   LDLCALC 175 (H) 06/16/2019   Lab Results  Component Value Date   TRIG 90 06/16/2019   Lab Results  Component Value Date  CHOLHDL 3.8 06/16/2019   Lab Results  Component Value Date   HGBA1C 5.4 06/16/2019      Assessment & Plan:   Problem List Items Addressed This Visit    None    Visit Diagnoses    Acute right-sided low back pain without sciatica    -  Primary   Relevant Medications   meloxicam (MOBIC) 15 MG tablet   methocarbamol (ROBAXIN) 500 MG tablet   Other Relevant Orders   POCT urinalysis dipstick (Completed)   Ambulatory referral to Physical Therapy      Meds ordered this encounter  Medications  . meloxicam (MOBIC) 15 MG tablet    Sig: Take 1 tablet (15 mg total) by mouth daily.    Dispense:  30 tablet    Refill:  0    Order Specific Question:   Supervising Provider    Answer:   Delia Chimes A [id number]  . methocarbamol (ROBAXIN) 500 MG tablet    Sig: Take 1 tablet (500 mg total) by mouth 4 (four) times daily.    Dispense:  60 tablet    Refill:  0    Order Specific Question:   Supervising Provider    Answer:   [name] [id number]    Follow-up: No follow-ups on file.   PLAN  POCT UA wnl  Suspect MSK etiology  Mobic, methocarbamol, light stretching. PT referral placed in case pain does not continue resolving  Patient encouraged to call clinic with any questions, comments, or concerns.  Maximiano Coss, NP

## 2020-03-30 NOTE — Telephone Encounter (Signed)
Patient has appointment 04/19/20- courtesy RF given #[phone]

## 2020-03-30 NOTE — Patient Instructions (Signed)
° ° ° °  If you have lab work done today you will be contacted with your lab results within the next 2 weeks.  If you have not heard from us then please contact us. The fastest way to get your results is to register for My Chart. ° ° °IF you received an x-ray today, you will receive an invoice from [facility]. Please contact [facility] at [phone] with questions or concerns regarding your invoice.  ° °IF you received labwork today, you will receive an invoice from [facility]. Please contact [facility] at [phone] with questions or concerns regarding your invoice.  ° °Our billing staff will not be able to assist you with questions regarding bills from these companies. ° °You will be contacted with the lab results as soon as they are available. The fastest way to get your results is to activate your My Chart account. Instructions are located on the last page of this paperwork. If you have not heard from us regarding the results in 2 weeks, please contact this office. °  ° ° ° °

## 2020-03-31 ENCOUNTER — Encounter: Payer: Self-pay | Admitting: Registered Nurse

## 2020-04-19 ENCOUNTER — Encounter: Payer: Self-pay | Admitting: Family Medicine

## 2020-04-19 ENCOUNTER — Ambulatory Visit: Payer: BC Managed Care – PPO | Admitting: Family Medicine

## 2020-04-19 ENCOUNTER — Other Ambulatory Visit: Payer: Self-pay

## 2020-04-19 VITALS — BP 140/85 | HR 86 | Temp 97.9°F | Ht 67.0 in | Wt 164.0 lb

## 2020-04-19 DIAGNOSIS — D472 Monoclonal gammopathy: Secondary | ICD-10-CM

## 2020-04-19 DIAGNOSIS — F411 Generalized anxiety disorder: Secondary | ICD-10-CM | POA: Diagnosis not present

## 2020-04-19 DIAGNOSIS — E7800 Pure hypercholesterolemia, unspecified: Secondary | ICD-10-CM | POA: Diagnosis not present

## 2020-04-19 DIAGNOSIS — D500 Iron deficiency anemia secondary to blood loss (chronic): Secondary | ICD-10-CM

## 2020-04-19 DIAGNOSIS — D563 Thalassemia minor: Secondary | ICD-10-CM

## 2020-04-19 MED ORDER — NORGESTIMATE-ETH ESTRADIOL 0.25-35 MG-MCG PO TABS
1.0000 | ORAL_TABLET | Freq: Every day | ORAL | 4 refills | Status: DC
Start: 2020-04-19 — End: 2020-06-14

## 2020-04-19 NOTE — Patient Instructions (Signed)
° ° ° °  If you have lab work done today you will be contacted with your lab results within the next 2 weeks.  If you have not heard from us then please contact us. The fastest way to get your results is to register for My Chart. ° ° °IF you received an x-ray today, you will receive an invoice from [facility]. Please contact [facility] at [phone] with questions or concerns regarding your invoice.  ° °IF you received labwork today, you will receive an invoice from [facility]. Please contact [facility] at [phone] with questions or concerns regarding your invoice.  ° °Our billing staff will not be able to assist you with questions regarding bills from these companies. ° °You will be contacted with the lab results as soon as they are available. The fastest way to get your results is to activate your My Chart account. Instructions are located on the last page of this paperwork. If you have not heard from us regarding the results in 2 weeks, please contact this office. °  ° ° ° °

## 2020-04-19 NOTE — Progress Notes (Signed)
6/7/202111:41 AM  Jessica [name] 1971/09/26, 49 y.o., female [id number]  Chief Complaint  Patient presents with  . Fatigue    has amenia, had infusion done last month. Not sure why she is still feeling tired    HPI:   Patient is a 49 y.o. female with past medical history significant for iron deficiency and beta thalassemia minor anemia, anxiety, HLP, MGUS who presents today to establish care  Previous PCP Dr Nolon Rod, last OV oct 2020 Anemia followed by onc, last OV April 2021 - anemia 2/2 menorrhagia and beta thalassemia minor, repeat myeloma panel unremarkable, fareheme infusion  Menses regular, 4-5 days, heavy first 2 days (changes every 3-4 hours), now starting to have more frequent periods She does not have an obgyn  S/p BTL Non smoker, no migraines, no HTN, no VTE Last mammogram feb 2021 Fhx CAD  Panic attacks well controlled on effexor and trazodone  Right hip bursitis much improved with PT  Lab Results  Component Value Date   CHOL 265 (H) 06/16/2019   HDL 70 06/16/2019   LDLCALC 175 (H) 06/16/2019   TRIG 90 06/16/2019   CHOLHDL 3.8 06/16/2019    Depression screen PHQ 2/9 03/30/2020 06/16/2019 09/23/2018  Decreased Interest 0 0 0  Down, Depressed, Hopeless 0 0 0  PHQ - 2 Score 0 0 0  Altered sleeping - - -  Tired, decreased energy - - -  Change in appetite - - -  Feeling bad or failure about yourself  - - -  Trouble concentrating - - -  Moving slowly or fidgety/restless - - -  Suicidal thoughts - - -  PHQ-9 Score - - -  Difficult doing work/chores - - -    Fall Risk  03/30/2020 09/10/2019 06/16/2019 09/23/2018 05/01/2018  Falls in the past year? 0 0 0 0 No  Number falls in past yr: - 0 0 - -  Injury with Fall? - 0 0 - -  Follow up Falls evaluation completed - Falls evaluation completed - -     Allergies  Allergen Reactions  . Sulfonamide Derivatives     REACTION: hives    Prior to Admission medications   Medication Sig Start Date End Date  Taking? Authorizing Provider  atorvastatin (LIPITOR) 20 MG tablet TAKE 1 TABLET BY MOUTH EVERY DAY 03/30/20  Yes Rutherford Guys, MD  meloxicam (MOBIC) 15 MG tablet Take 1 tablet (15 mg total) by mouth daily. 03/30/20  Yes Maximiano Coss, NP  methocarbamol (ROBAXIN) 500 MG tablet Take 1 tablet (500 mg total) by mouth 4 (four) times daily. 03/30/20  Yes Maximiano Coss, NP  traZODone (DESYREL) 50 MG tablet TAKE 0.5-1 TABLETS (25-50 MG TOTAL) BY MOUTH AT BEDTIME AS NEEDED FOR SLEEP. 03/30/20  Yes Rutherford Guys, MD  venlafaxine XR (EFFEXOR-XR) 37.5 MG 24 hr capsule TAKE 1 CAPSULE BY MOUTH DAILY WITH BREAKFAST. 03/30/20  Yes Rutherford Guys, MD    Past Medical History:  Diagnosis Date  . Allergy   . Anemia   . Anxiety   . Depression   . Hyperlipidemia     Past Surgical History:  Procedure Laterality Date  . TUBAL LIGATION      Social History   Tobacco Use  . Smoking status: Never Smoker  . Smokeless tobacco: Never Used  Substance Use Topics  . Alcohol use: No    Alcohol/week: 0.0 standard drinks    Family History  Problem Relation Age of Onset  . Alcohol abuse Father   .  Drug abuse Father   . Hyperlipidemia Father   . Hypertension Father   . Alcohol abuse Maternal Uncle   . Heart disease Mother   . Hyperlipidemia Mother   . Hypertension Mother   . Heart disease Maternal Grandmother   . Hypertension Maternal Grandmother   . Hyperlipidemia Maternal Grandmother   . Cancer Paternal Grandmother   . Diabetes Paternal Grandmother   . Hypertension Paternal Grandmother   . Cancer Paternal Grandfather   . Breast cancer Neg Hx     Review of Systems  Constitutional: Negative for chills and fever.  Respiratory: Negative for cough and shortness of breath.   Cardiovascular: Negative for chest pain, palpitations and leg swelling.  Gastrointestinal: Negative for abdominal pain, nausea and vomiting.     OBJECTIVE:  Today's Vitals   04/19/20 1130  BP: 140/85  Pulse: 86    Temp: 97.9 F (36.6 C)  SpO2: 100%  Weight: 164 lb (74.4 kg)  Height: 5\' 7"  (1.702 m)   Body mass index is 25.69 kg/m.   Physical Exam Vitals and nursing note reviewed.  Constitutional:      Appearance: She is well-developed.  HENT:     Head: Normocephalic and atraumatic.     Mouth/Throat:     Pharynx: No oropharyngeal exudate.  Eyes:     General: No scleral icterus.    Conjunctiva/sclera: Conjunctivae normal.     Pupils: Pupils are equal, round, and reactive to light.  Cardiovascular:     Rate and Rhythm: Normal rate and regular rhythm.     Heart sounds: Normal heart sounds. No murmur. No friction rub. No gallop.   Pulmonary:     Effort: Pulmonary effort is normal.     Breath sounds: Normal breath sounds. No wheezing or rales.  Musculoskeletal:     Cervical back: Neck supple.  Skin:    General: Skin is warm and dry.  Neurological:     Mental Status: She is alert and oriented to person, place, and time.     No results found for this or any previous visit (from the past 24 hour(s)).  No results found.   ASSESSMENT and PLAN  1. ANXIETY DISORDER, GENERALIZED Controlled. Continue current regime.  - TSH  2. HYPERCHOLESTEROLEMIA Checking labs today, medications will be adjusted as needed. LDL goal < 100 given fhx.  - Lipid panel - Comprehensive metabolic panel  3. Iron deficiency anemia due to chronic blood loss Discussed treatment of cause (menses) - reviewed treatment options, trial of COC. Reviewed r/se/b. Handout given.  4. Beta thalassemia minor  5. MGUS (monoclonal gammopathy of unknown significance) Monitored by onc  Other orders - norgestimate-ethinyl estradiol (ORTHO-CYCLEN) 0.25-35 MG-MCG tablet; Take 1 tablet by mouth daily. Skip the placebo pills.  Return for as scheduled.     Rutherford Guys, MD Primary Care at [facility] [place], [place] [address] Ph.  [phone] Fax [phone]

## 2020-04-20 LAB — COMPREHENSIVE METABOLIC PANEL
ALT: 27 IU/L (ref 0–32)
AST: 20 IU/L (ref 0–40)
Albumin/Globulin Ratio: 1.3 (ref 1.2–2.2)
Albumin: 4.4 g/dL (ref 3.8–4.8)
Alkaline Phosphatase: 73 IU/L (ref 48–121)
BUN/Creatinine Ratio: 13 (ref 9–23)
BUN: 13 mg/dL (ref 6–24)
Bilirubin Total: 0.3 mg/dL (ref 0.0–1.2)
CO2: 23 mmol/L (ref 20–29)
Calcium: 9.4 mg/dL (ref 8.7–10.2)
Chloride: 107 mmol/L — ABNORMAL HIGH (ref 96–106)
Creatinine, Ser: 1.01 mg/dL — ABNORMAL HIGH (ref 0.57–1.00)
GFR calc Af Amer: 76 mL/min/{1.73_m2} (ref >59)
GFR calc non Af Amer: 66 mL/min/{1.73_m2} (ref >59)
Globulin, Total: 3.3 g/dL (ref 1.5–4.5)
Glucose: 98 mg/dL (ref 65–99)
Potassium: 5 mmol/L (ref 3.5–5.2)
Sodium: 143 mmol/L (ref 134–144)
Total Protein: 7.7 g/dL (ref 6.0–8.5)

## 2020-04-20 LAB — LIPID PANEL
Chol/HDL Ratio: 2.3 ratio (ref 0.0–4.4)
Cholesterol, Total: 202 mg/dL — ABNORMAL HIGH (ref 100–199)
HDL: 87 mg/dL (ref >39)
LDL Chol Calc (NIH): 104 mg/dL — ABNORMAL HIGH (ref 0–99)
Triglycerides: 58 mg/dL (ref 0–149)
VLDL Cholesterol Cal: 11 mg/dL (ref 5–40)

## 2020-04-20 LAB — TSH: TSH: 2.12 u[IU]/mL (ref 0.450–4.500)

## 2020-04-21 ENCOUNTER — Other Ambulatory Visit: Payer: Self-pay | Admitting: Registered Nurse

## 2020-04-21 ENCOUNTER — Other Ambulatory Visit: Payer: Self-pay | Admitting: Family Medicine

## 2020-04-21 DIAGNOSIS — M545 Low back pain, unspecified: Secondary | ICD-10-CM

## 2020-04-21 NOTE — Telephone Encounter (Signed)
Requested Prescriptions  Pending Prescriptions Disp Refills   traZODone (DESYREL) 50 MG tablet [Pharmacy Med Name: TRAZODONE 50 MG TABLET] 90 tablet 1    Sig: TAKE 0.5-1 TABLETS (25-50 MG TOTAL) BY MOUTH AT BEDTIME AS NEEDED FOR SLEEP.     Psychiatry: Antidepressants - Serotonin Modulator Passed - 04/21/2020  1:25 AM      Passed - Completed PHQ-2 or PHQ-9 in the last 360 days.      Passed - Valid encounter within last 6 months    Recent Outpatient Visits          2 days ago [place], GENERALIZED   [facility] at [facility], Lilia Argue, MD   3 weeks ago Acute right-sided low back pain without sciatica   Primary Care at [place], NP   7 months ago Heart palpitations   Primary Care at [facility], Arlie Solomons, MD   10 months ago Encounter for health maintenance examination in adult   Primary Care at [facility], Arlie Solomons, MD   1 year ago [place], GENERALIZED   Primary Care at [facility], Reather Laurence, PA-C      Future Appointments            In 2 months Rutherford Guys, MD Primary Care at [place], [facility]

## 2020-04-21 NOTE — Telephone Encounter (Signed)
Patient is requesting a refill of the following medications: Requested Prescriptions   Pending Prescriptions Disp Refills  . meloxicam (MOBIC) 15 MG tablet [Pharmacy Med Name: MELOXICAM 15 MG TABLET] 30 tablet 0    Sig: TAKE 1 TABLET BY MOUTH EVERY DAY    Date of patient request: 04/21/2020 Last office visit: 6/07/221 Date of last refill: 03/30/2020 Last refill amount: 30 tablets  Follow up time period per chart: 06/21/2020

## 2020-05-25 ENCOUNTER — Telehealth: Payer: Self-pay | Admitting: Family Medicine

## 2020-05-25 NOTE — Telephone Encounter (Signed)
Please order fasting labs for physical scheduled for 08/02/2020

## 2020-05-26 NOTE — Addendum Note (Signed)
Addended by: Kittie Plater, Ellar Hakala HUA on: 05/26/2020 09:24 AM   Modules accepted: Orders

## 2020-05-26 NOTE — Telephone Encounter (Signed)
Labs were just done in June do you want a repeat Lipid, CMP and additional blood work for her upcoming 08/02/20 CPE? Please advise.

## 2020-05-27 NOTE — Telephone Encounter (Signed)
Patient was informed no labs are needed at this time

## 2020-05-27 NOTE — Telephone Encounter (Signed)
No labs are needed at this time. Please let patient know. thanks

## 2020-06-09 ENCOUNTER — Other Ambulatory Visit: Payer: Self-pay | Admitting: Family Medicine

## 2020-06-14 ENCOUNTER — Other Ambulatory Visit: Payer: Self-pay | Admitting: Family Medicine

## 2020-06-14 NOTE — Telephone Encounter (Signed)
Requested medication (s) are due for refill today: no  Requested medication (s) are on the active medication list: yes  Last refill: 05/29/2020  Future visit scheduled: yes  Notes to clinic:  [place]   Requested Prescriptions  Pending Prescriptions Disp Refills   [place] 28 0.25-35 MG-MCG tablet [Pharmacy Med Name: SPRINTEC 28 DAY TABLET] 84 tablet 4    Sig: TAKE 1 TABLET BY MOUTH DAILY. SKIP THE PLACEBO PILLS.      OB/GYN:  Contraceptives Failed - 06/14/2020  5:33 AM      Failed - Last BP in normal range    BP Readings from Last 1 Encounters:  04/19/20 140/85          Passed - Valid encounter within last 12 months    Recent Outpatient Visits           1 month ago [place], GENERALIZED   [facility] at [facility], Lilia Argue, MD   2 months ago Acute right-sided low back pain without sciatica   Primary Care at [place], NP   9 months ago Heart palpitations   Primary Care at [place], MD   12 months ago Encounter for health maintenance examination in adult   Primary Care at [facility], Arlie Solomons, MD   1 year ago [place], GENERALIZED   Primary Care at [facility], Reather Laurence, PA-C       Future Appointments             In 1 month Pamella Pert, Lilia Argue, MD Primary Care at [place], [facility]

## 2020-06-17 ENCOUNTER — Ambulatory Visit: Payer: BC Managed Care – PPO

## 2020-06-21 ENCOUNTER — Encounter: Payer: BC Managed Care – PPO | Admitting: Family Medicine

## 2020-07-10 ENCOUNTER — Other Ambulatory Visit: Payer: Self-pay | Admitting: Family Medicine

## 2020-07-28 ENCOUNTER — Ambulatory Visit: Payer: BC Managed Care – PPO

## 2020-08-02 ENCOUNTER — Encounter: Payer: BC Managed Care – PPO | Admitting: Family Medicine

## 2020-08-08 ENCOUNTER — Other Ambulatory Visit: Payer: Self-pay | Admitting: Registered Nurse

## 2020-08-08 DIAGNOSIS — M545 Low back pain, unspecified: Secondary | ICD-10-CM

## 2020-08-08 NOTE — Telephone Encounter (Signed)
Requested medication (s) are due for refill today: yes  Requested medication (s) are on the active medication list: yes  Last refill:  03/30/20  Future visit scheduled: yes  Notes to clinic:  med not delegated to NT to RF   Requested Prescriptions  Pending Prescriptions Disp Refills   methocarbamol (ROBAXIN) 500 MG tablet [Pharmacy Med Name: METHOCARBAMOL 500 MG TABLET] 60 tablet 0    Sig: Take 1 tablet (500 mg total) by mouth 4 (four) times daily.      Not Delegated - Analgesics:  Muscle Relaxants Failed - 08/08/2020 10:07 AM      Failed - This refill cannot be delegated      Passed - Valid encounter within last 6 months    Recent Outpatient Visits           3 months ago [place], GENERALIZED   [facility] at [facility], Lilia Argue, MD   4 months ago Acute right-sided low back pain without sciatica   [facility], Delfino Lovett, NP   11 months ago Heart palpitations   Primary Care at [facility], Arlie Solomons, MD   1 year ago Encounter for health maintenance examination in adult   Primary Care at [facility], Arlie Solomons, MD   1 year ago [place], GENERALIZED   [facility] at [facility], Reather Laurence, PA-C       Future Appointments             In 2 weeks Maximiano Coss, NP Primary Care at [place], [facility]

## 2020-08-25 ENCOUNTER — Other Ambulatory Visit: Payer: Self-pay

## 2020-08-25 ENCOUNTER — Ambulatory Visit (INDEPENDENT_AMBULATORY_CARE_PROVIDER_SITE_OTHER): Payer: BC Managed Care – PPO | Admitting: Registered Nurse

## 2020-08-25 ENCOUNTER — Encounter: Payer: Self-pay | Admitting: Registered Nurse

## 2020-08-25 VITALS — BP 158/84 | HR 82 | Temp 97.9°F | Resp 18 | Ht 67.0 in | Wt 161.0 lb

## 2020-08-25 DIAGNOSIS — E7800 Pure hypercholesterolemia, unspecified: Secondary | ICD-10-CM

## 2020-08-25 DIAGNOSIS — R202 Paresthesia of skin: Secondary | ICD-10-CM

## 2020-08-25 DIAGNOSIS — D472 Monoclonal gammopathy: Secondary | ICD-10-CM

## 2020-08-25 DIAGNOSIS — Z0000 Encounter for general adult medical examination without abnormal findings: Secondary | ICD-10-CM

## 2020-08-25 DIAGNOSIS — D500 Iron deficiency anemia secondary to blood loss (chronic): Secondary | ICD-10-CM | POA: Diagnosis not present

## 2020-08-25 DIAGNOSIS — Z23 Encounter for immunization: Secondary | ICD-10-CM | POA: Diagnosis not present

## 2020-08-25 DIAGNOSIS — Z1211 Encounter for screening for malignant neoplasm of colon: Secondary | ICD-10-CM

## 2020-08-25 DIAGNOSIS — H9193 Unspecified hearing loss, bilateral: Secondary | ICD-10-CM

## 2020-08-25 DIAGNOSIS — Z1329 Encounter for screening for other suspected endocrine disorder: Secondary | ICD-10-CM

## 2020-08-25 DIAGNOSIS — Z0001 Encounter for general adult medical examination with abnormal findings: Secondary | ICD-10-CM | POA: Diagnosis not present

## 2020-08-25 LAB — POCT GLYCOSYLATED HEMOGLOBIN (HGB A1C): Hemoglobin A1C: 5.4 % (ref 4.0–5.6)

## 2020-08-25 NOTE — Progress Notes (Signed)
Established Patient Office Visit  Subjective:  Patient ID: Jessica [name], female    DOB: 1971-03-20  Age: 49 y.o. MRN: [id number]  CC:  Chief Complaint  Patient presents with   Annual Exam    Patient states she is here for an CPE . PAtient states she has been having some trouble hearing    HPI Jessica [name] presents for CPE  Notes that she has been having trouble hearing. Both ears. Did not notice on her own until her husband and daughter stated that her TV was too loud. Feels muffled. No drainage or pain. Does not seem to be seasonal in nature. No URI symptoms. No trauma to ears or frequent exposure to loud noises in past.  Also has questions about COVID booster. Given her hx of early vaccination - both shots in 2020 - as well as MGUS and Vitiligo, will draw IgG for covid today and recommend booster based on results. Unfortunately I anticipate that her immunity may have waned since her initial shots. Discussed getting booster, expected side effects, and benefits.  Due for cologuard. No known fam hx of colon ca and no symptoms beyond occ constipation r/t iron supplement use. Up to date on pap. Unsure if she needs refills at this time  Also notes spots of numbness/tingling on medial aspect of R foot. Intermittent. No clear etiology. No hx of trauma to R foot or ankle. No swelling or rash. Will draw labs inc Vit D to investigate.  Otherwise no concerns.   Past Medical History:  Diagnosis Date   Allergy    Anemia    Anxiety    Depression    Hyperlipidemia    MGUS (monoclonal gammopathy of unknown significance)     Past Surgical History:  Procedure Laterality Date   TUBAL LIGATION      Family History  Problem Relation Age of Onset   Alcohol abuse Father    Drug abuse Father    Hyperlipidemia Father    Hypertension Father    Alcohol abuse Maternal Uncle    Heart disease Mother    Hyperlipidemia Mother    Hypertension Mother    Heart disease  Maternal Grandmother    Hypertension Maternal Grandmother    Hyperlipidemia Maternal Grandmother    Cancer Paternal Grandmother    Diabetes Paternal Grandmother    Hypertension Paternal Grandmother    Cancer Paternal Grandfather    Breast cancer Neg Hx     Social History   Socioeconomic History   Marital status: Married    Spouse name: Not on file   Number of children: 2   Years of education: Not on file   Highest education level: Not on file  Occupational History   Not on file  Tobacco Use   Smoking status: Never Smoker   Smokeless tobacco: Never Used  Vaping Use   Vaping Use: Never used  Substance and Sexual Activity   Alcohol use: No    Alcohol/week: 0.0 standard drinks   Drug use: No   Sexual activity: Yes    Birth control/protection: Surgical  Other Topics Concern   Not on file  Social History Narrative   Not on file   Social Determinants of Health   Financial Resource Strain:    Difficulty of Paying Living Expenses: Not on file  Food Insecurity:    Worried About Running Out of Food in the Last Year: Not on file   [employer] of Food in the Last Year: Not on file  Transportation Needs:    Film/video editor (Medical): Not on file   Lack of Transportation (Non-Medical): Not on file  Physical Activity:    Days of Exercise per Week: Not on file   Minutes of Exercise per Session: Not on file  Stress:    Feeling of Stress : Not on file  Social Connections:    Frequency of Communication with Friends and Family: Not on file   Frequency of Social Gatherings with Friends and Family: Not on file   Attends Religious Services: Not on file   Active Member of [employer] or Organizations: Not on file   Attends Archivist Meetings: Not on file   Marital Status: Not on file  Intimate Partner Violence:    Fear of Current or Ex-Partner: Not on file   Emotionally Abused: Not on file   Physically Abused: Not on file   Sexually  Abused: Not on file    Outpatient Medications Prior to Visit  Medication Sig Dispense Refill   atorvastatin (LIPITOR) 20 MG tablet TAKE 1 TABLET BY MOUTH EVERY DAY 90 tablet 2   meloxicam (MOBIC) 15 MG tablet TAKE 1 TABLET BY MOUTH EVERY DAY 30 tablet 0   SPRINTEC 28 0.25-35 MG-MCG tablet TAKE 1 TABLET BY MOUTH DAILY. SKIP THE PLACEBO PILLS. 84 tablet 4   traZODone (DESYREL) 50 MG tablet TAKE 0.5-1 TABLETS (25-50 MG TOTAL) BY MOUTH AT BEDTIME AS NEEDED FOR SLEEP. 90 tablet 1   venlafaxine XR (EFFEXOR-XR) 37.5 MG 24 hr capsule TAKE 1 CAPSULE BY MOUTH EVERY DAY WITH BREAKFAST 90 capsule 0   methocarbamol (ROBAXIN) 500 MG tablet TAKE 1 TABLET (500 MG TOTAL) BY MOUTH 4 (FOUR) TIMES DAILY. (Patient not taking: Reported on 08/25/2020) 60 tablet 0   No facility-administered medications prior to visit.    Allergies  Allergen Reactions   Sulfonamide Derivatives     REACTION: hives    ROS Review of Systems  Constitutional: Negative.   HENT: Positive for hearing loss.   Eyes: Negative.   Respiratory: Negative.   Cardiovascular: Negative.   Gastrointestinal: Negative.   Genitourinary: Negative.   Musculoskeletal: Negative.   Skin: Negative.   Neurological: Negative.   Psychiatric/Behavioral: Negative.       Objective:    Physical Exam Vitals and nursing note reviewed.  Constitutional:      General: She is not in acute distress.    Appearance: Normal appearance. She is normal weight. She is not ill-appearing, toxic-appearing or diaphoretic.  HENT:     Head: Normocephalic and atraumatic.     Right Ear: Tympanic membrane, ear canal and external ear normal. There is no impacted cerumen.     Left Ear: Tympanic membrane, ear canal and external ear normal. There is no impacted cerumen.     Ears:     Weber exam findings: does not lateralize.    Right Rinne: AC > BC.    Left Rinne: AC > BC.    Nose: Nose normal. No congestion or rhinorrhea.     Mouth/Throat:     Mouth: Mucous  membranes are moist.     Pharynx: Oropharynx is clear. No oropharyngeal exudate or posterior oropharyngeal erythema.  Eyes:     General: No scleral icterus.       Right eye: No discharge.        Left eye: No discharge.     Extraocular Movements: Extraocular movements intact.     Conjunctiva/sclera: Conjunctivae normal.     Pupils: Pupils are equal,  round, and reactive to light.  Cardiovascular:     Rate and Rhythm: Normal rate and regular rhythm.     Pulses: Normal pulses.     Heart sounds: Normal heart sounds. No murmur heard.  No friction rub. No gallop.   Pulmonary:     Effort: Pulmonary effort is normal. No respiratory distress.     Breath sounds: Normal breath sounds. No stridor. No wheezing, rhonchi or rales.  Chest:     Chest wall: No tenderness.  Abdominal:     General: Abdomen is flat. Bowel sounds are normal. There is no distension.     Palpations: Abdomen is soft. There is no mass.     Tenderness: There is no abdominal tenderness. There is no right CVA tenderness, left CVA tenderness, guarding or rebound.     Hernia: No hernia is present.  Musculoskeletal:        General: No swelling, tenderness, deformity or signs of injury. Normal range of motion.     Right lower leg: No edema.     Left lower leg: No edema.  Skin:    General: Skin is warm and dry.     Capillary Refill: Capillary refill takes less than 2 seconds.     Coloration: Skin is not jaundiced or pale.     Findings: No bruising, erythema, lesion or rash.  Neurological:     General: No focal deficit present.     Mental Status: She is alert and oriented to person, place, and time. Mental status is at baseline.     Cranial Nerves: No cranial nerve deficit.     Sensory: No sensory deficit.     Motor: No weakness.     Coordination: Coordination normal.     Gait: Gait normal.     Deep Tendon Reflexes: Reflexes normal.  Psychiatric:        Mood and Affect: Mood normal.        Behavior: Behavior normal.         Thought Content: Thought content normal.        Judgment: Judgment normal.     BP (!) 158/84    Pulse 82    Temp 97.9 F (36.6 C) (Temporal)    Resp 18    Ht 5\' 7"  (1.702 m)    Wt 161 lb (73 kg)    SpO2 100%    BMI 25.22 kg/m  Wt Readings from Last 3 Encounters:  08/25/20 161 lb (73 kg)  04/19/20 164 lb (74.4 kg)  03/30/20 158 lb 12.8 oz (72 kg)     Health Maintenance Due  Topic Date Due   INFLUENZA VACCINE  06/13/2020    There are no preventive care reminders to display for this patient.  Lab Results  Component Value Date   TSH 2.120 04/19/2020   Lab Results  Component Value Date   WBC 5.6 03/02/2020   HGB 11.0 (L) 03/02/2020   HCT 36.0 03/02/2020   MCV 75.2 (L) 03/02/2020   PLT 282 03/02/2020   Lab Results  Component Value Date   NA 143 04/19/2020   K 5.0 04/19/2020   CO2 23 04/19/2020   GLUCOSE 98 04/19/2020   BUN 13 04/19/2020   CREATININE 1.01 (H) 04/19/2020   BILITOT 0.3 04/19/2020   ALKPHOS 73 04/19/2020   AST 20 04/19/2020   ALT 27 04/19/2020   PROT 7.7 04/19/2020   ALBUMIN 4.4 04/19/2020   CALCIUM 9.4 04/19/2020   ANIONGAP 9 03/02/2020   Lab Results  Component Value Date   CHOL 202 (H) 04/19/2020   Lab Results  Component Value Date   HDL 87 04/19/2020   Lab Results  Component Value Date   LDLCALC 104 (H) 04/19/2020   Lab Results  Component Value Date   TRIG 58 04/19/2020   Lab Results  Component Value Date   CHOLHDL 2.3 04/19/2020   Lab Results  Component Value Date   HGBA1C 5.4 08/25/2020      Assessment & Plan:   Problem List Items Addressed This Visit      Other   HYPERCHOLESTEROLEMIA - Primary   Relevant Orders   Lipid panel   ANEMIA, IRON DEFICIENCY, UNSPEC.   Relevant Orders   CBC With Differential   Comprehensive metabolic panel   Iron, TIBC and Ferritin Panel   Ferritin   MGUS (monoclonal gammopathy of unknown significance)   Relevant Orders   CBC With Differential   Comprehensive metabolic panel      Other Visit Diagnoses    Flu vaccine need       Relevant Orders   Flu Vaccine QUAD 6+ mos PF IM (Fluarix Quad PF)   Screening for endocrine disorder       Relevant Orders   POCT glycosylated hemoglobin (Hb A1C) (Completed)   COVID-19 vaccine administered       Relevant Orders   SAR CoV2 Serology (COVID 19)AB(IGG)IA   Decreased hearing of both ears       Relevant Orders   Ambulatory referral to Audiology   Paresthesias       Relevant Orders   Vitamin D, 25-hydroxy   Special screening for malignant neoplasms, colon       Relevant Orders   Cologuard      No orders of the defined types were placed in this encounter.   Follow-up: Return in about 3 months (around 11/25/2020) for est care with Just.   PLAN  Recheck bp shows 128/78. Suspect white coat syndrome. Will routinely check at all visits.  Labs collected, will follow up as warranted  No abnormalities on ear exam, weber, or rinne. Will refer to audiology  Otherwise cpe unremarkable  Return in 3 mo to est care with Huston Foley Just  Patient encouraged to call clinic with any questions, comments, or concerns.  Maximiano Coss, NP

## 2020-08-25 NOTE — Patient Instructions (Signed)
° ° ° °  If you have lab work done today you will be contacted with your lab results within the next 2 weeks.  If you have not heard from us then please contact us. The fastest way to get your results is to register for My Chart. ° ° °IF you received an x-ray today, you will receive an invoice from [facility]. Please contact [facility] at [phone] with questions or concerns regarding your invoice.  ° °IF you received labwork today, you will receive an invoice from [facility]. Please contact [facility] at [phone] with questions or concerns regarding your invoice.  ° °Our billing staff will not be able to assist you with questions regarding bills from these companies. ° °You will be contacted with the lab results as soon as they are available. The fastest way to get your results is to activate your My Chart account. Instructions are located on the last page of this paperwork. If you have not heard from us regarding the results in 2 weeks, please contact this office. °  ° ° ° °

## 2020-08-26 LAB — IRON,TIBC AND FERRITIN PANEL
Ferritin: 646 ng/mL — ABNORMAL HIGH (ref 15–150)
Iron Saturation: 25 % (ref 15–55)
Iron: 69 ug/dL (ref 27–159)
Total Iron Binding Capacity: 280 ug/dL (ref 250–450)
UIBC: 211 ug/dL (ref 131–425)

## 2020-08-26 LAB — COMPREHENSIVE METABOLIC PANEL
ALT: 9 IU/L (ref 0–32)
AST: 14 IU/L (ref 0–40)
Albumin/Globulin Ratio: 1.4 (ref 1.2–2.2)
Albumin: 4.6 g/dL (ref 3.8–4.8)
Alkaline Phosphatase: 59 IU/L (ref 44–121)
BUN/Creatinine Ratio: 11 (ref 9–23)
BUN: 12 mg/dL (ref 6–24)
Bilirubin Total: 0.4 mg/dL (ref 0.0–1.2)
CO2: 25 mmol/L (ref 20–29)
Calcium: 9.4 mg/dL (ref 8.7–10.2)
Chloride: 104 mmol/L (ref 96–106)
Creatinine, Ser: 1.09 mg/dL — ABNORMAL HIGH (ref 0.57–1.00)
GFR calc Af Amer: 69 mL/min/{1.73_m2} (ref >59)
GFR calc non Af Amer: 60 mL/min/{1.73_m2} (ref >59)
Globulin, Total: 3.2 g/dL (ref 1.5–4.5)
Glucose: 100 mg/dL — ABNORMAL HIGH (ref 65–99)
Potassium: 5.1 mmol/L (ref 3.5–5.2)
Sodium: 142 mmol/L (ref 134–144)
Total Protein: 7.8 g/dL (ref 6.0–8.5)

## 2020-08-26 LAB — CBC WITH DIFFERENTIAL
Basophils Absolute: 0.1 10*3/uL (ref 0.0–0.2)
Basos: 1 %
EOS (ABSOLUTE): 0.1 10*3/uL (ref 0.0–0.4)
Eos: 1 %
Hematocrit: 35.9 % (ref 34.0–46.6)
Hemoglobin: 11.4 g/dL (ref 11.1–15.9)
Immature Grans (Abs): 0 10*3/uL (ref 0.0–0.1)
Immature Granulocytes: 0 %
Lymphocytes Absolute: 2 10*3/uL (ref 0.7–3.1)
Lymphs: 33 %
MCH: 22.7 pg — ABNORMAL LOW (ref 26.6–33.0)
MCHC: 31.8 g/dL (ref 31.5–35.7)
MCV: 72 fL — ABNORMAL LOW (ref 79–97)
Monocytes Absolute: 0.5 10*3/uL (ref 0.1–0.9)
Monocytes: 8 %
Neutrophils Absolute: 3.4 10*3/uL (ref 1.4–7.0)
Neutrophils: 57 %
RBC: 5.02 x10E6/uL (ref 3.77–5.28)
RDW: 15.6 % — ABNORMAL HIGH (ref 11.7–15.4)
WBC: 6 10*3/uL (ref 3.4–10.8)

## 2020-08-26 LAB — LIPID PANEL
Chol/HDL Ratio: 2.8 ratio (ref 0.0–4.4)
Cholesterol, Total: 225 mg/dL — ABNORMAL HIGH (ref 100–199)
HDL: 80 mg/dL (ref >39)
LDL Chol Calc (NIH): 129 mg/dL — ABNORMAL HIGH (ref 0–99)
Triglycerides: 90 mg/dL (ref 0–149)
VLDL Cholesterol Cal: 16 mg/dL (ref 5–40)

## 2020-08-26 LAB — SAR COV2 SEROLOGY (COVID19)AB(IGG),IA: DiaSorin SARS-CoV-2 Ab, IgG: POSITIVE

## 2020-08-26 LAB — VITAMIN D 25 HYDROXY (VIT D DEFICIENCY, FRACTURES): Vit D, 25-Hydroxy: 40.3 ng/mL (ref 30.0–100.0)

## 2020-08-31 ENCOUNTER — Inpatient Hospital Stay: Payer: BC Managed Care – PPO | Attending: Internal Medicine

## 2020-08-31 ENCOUNTER — Other Ambulatory Visit: Payer: Self-pay

## 2020-08-31 DIAGNOSIS — D500 Iron deficiency anemia secondary to blood loss (chronic): Secondary | ICD-10-CM

## 2020-08-31 DIAGNOSIS — D509 Iron deficiency anemia, unspecified: Secondary | ICD-10-CM | POA: Diagnosis not present

## 2020-08-31 LAB — CBC WITH DIFFERENTIAL (CANCER CENTER ONLY)
Abs Immature Granulocytes: 0.02 10*3/uL (ref 0.00–0.07)
Basophils Absolute: 0.1 10*3/uL (ref 0.0–0.1)
Basophils Relative: 1 %
Eosinophils Absolute: 0 10*3/uL (ref 0.0–0.5)
Eosinophils Relative: 0 %
HCT: 34.7 % — ABNORMAL LOW (ref 36.0–46.0)
Hemoglobin: 10.8 g/dL — ABNORMAL LOW (ref 12.0–15.0)
Immature Granulocytes: 0 %
Lymphocytes Relative: 29 %
Lymphs Abs: 2.2 10*3/uL (ref 0.7–4.0)
MCH: 22.7 pg — ABNORMAL LOW (ref 26.0–34.0)
MCHC: 31.1 g/dL (ref 30.0–36.0)
MCV: 72.9 fL — ABNORMAL LOW (ref 80.0–100.0)
Monocytes Absolute: 0.5 10*3/uL (ref 0.1–1.0)
Monocytes Relative: 6 %
Neutro Abs: 4.8 10*3/uL (ref 1.7–7.7)
Neutrophils Relative %: 64 %
Platelet Count: 315 10*3/uL (ref 150–400)
RBC: 4.76 MIL/uL (ref 3.87–5.11)
RDW: 15.3 % (ref 11.5–15.5)
WBC Count: 7.6 10*3/uL (ref 4.0–10.5)
nRBC: 0 % (ref 0.0–0.2)

## 2020-08-31 LAB — IRON AND TIBC
Iron: 83 ug/dL (ref 41–142)
Saturation Ratios: 30 % (ref 21–57)
TIBC: 281 ug/dL (ref 236–444)
UIBC: 198 ug/dL (ref 120–384)

## 2020-08-31 LAB — FERRITIN: Ferritin: 427 ng/mL — ABNORMAL HIGH (ref 11–307)

## 2020-09-02 ENCOUNTER — Other Ambulatory Visit: Payer: Self-pay

## 2020-09-02 ENCOUNTER — Ambulatory Visit: Payer: BC Managed Care – PPO | Attending: Registered Nurse | Admitting: Audiologist

## 2020-09-02 DIAGNOSIS — H9193 Unspecified hearing loss, bilateral: Secondary | ICD-10-CM | POA: Insufficient documentation

## 2020-09-02 NOTE — Procedures (Signed)
  Outpatient Audiology and [facility] [place], [place]  [address] [phone]  AUDIOLOGICAL  EVALUATION  NAME: Jessica [name]     DOB:   21-Dec-1970      MRN: [id number]                                                                                     DATE: 09/02/2020     REFERENT: Patient, No Pcp Per STATUS: Outpatient DIAGNOSIS: Normal Hearing    History: Reda was seen for an audiological evaluation.  [name] is receiving a hearing evaluation due to concerns for hearing loss, her family is telling her she is not hearing well. [name] is having to turn up the TV louder than her family is comfortable with. She wonders if the TV is broken, but also wants her hearing evaluated just in case. This difficulty began last year. No pain or pressure reported in either ear. No tinnitus present in either ear. [name] has no family history of hearing loss onset around her age and no history of excessive noise exposure.  Medical history negative for any risk factor for hearing loss. No other relevant case history reported.   Evaluation:   Otoscopy showed a clear view of the tympanic membranes, bilaterally  Tympanometry results were consistent with normal middle ear function, bilaterally    Audiometric testing was completed using conventional audiometry with insert transducer. Speech Recognition Thresholds were consistent with pure tone averages. Word Recognition was excellent at conversation level. Pure tone thresholds show normal hearing in both ears. Test results are consistent with excellent hearing and speech understanding in quiet.   QuickSin performed with an averaged bilateral SNR of 3.0dB. This shows normal ability to hear in the presence of background noise.   Results:  The test results were reviewed with [name]. She has normal hearing in both ears. This was explained and she was given two copies of her audiogram. We discussed cognitive load and how it is harder  to hear when a person is stressed. We often need speech louder to understand it after a long day or when we have a lot on our mind. That is when people get in their car in the morning, the radio sounds too loud. The volume needed coming home from work is louder than when people are fresh going to work. She reported understanding.   Recommendations: 1.   No further audiologic testing is needed at this time unless future hearing concerns arise.     [name]  Audiologist, Au.D., CCC-A 09/02/2020  7:31 AM  Cc: Maximiano Coss NP

## 2020-09-03 ENCOUNTER — Other Ambulatory Visit: Payer: Self-pay | Admitting: Registered Nurse

## 2020-09-03 DIAGNOSIS — M545 Low back pain, unspecified: Secondary | ICD-10-CM

## 2020-09-03 MED ORDER — VENLAFAXINE HCL ER 37.5 MG PO CP24: ORAL_CAPSULE | ORAL | 0 refills | Status: DC

## 2020-09-03 NOTE — Telephone Encounter (Signed)
Medication Refill - Medication: Effexor ER 37.5  Has the patient contacted their pharmacy? No. (Agent: If no, request that the patient contact the pharmacy for the refill.) (Agent: If yes, when and what did the pharmacy advise?)  Preferred Pharmacy (with phone number or street name): [facility]  Agent: Please be advised that RX refills may take up to 3 business days. We ask that you follow-up with your pharmacy.

## 2020-09-04 NOTE — Telephone Encounter (Signed)
Requested medication (s) are due for refill today: yes  Requested medication (s) are on the active medication list: yes  Last refill:  08/09/20  Future visit scheduled: yes  Notes to clinic:  med not delegated to NT to RF   Requested Prescriptions  Pending Prescriptions Disp Refills   methocarbamol (ROBAXIN) 500 MG tablet [Pharmacy Med Name: METHOCARBAMOL 500 MG TABLET] 60 tablet 0    Sig: TAKE 1 TABLET (500 MG TOTAL) BY MOUTH 4 (FOUR) TIMES DAILY.      Not Delegated - Analgesics:  Muscle Relaxants Failed - 09/03/2020  7:25 PM      Failed - This refill cannot be delegated      Passed - Valid encounter within last 6 months    Recent Outpatient Visits           1 week ago Annual physical exam   Primary Care at [place], NP   4 months ago [place], GENERALIZED   [facility] at [facility], Lilia Argue, MD   5 months ago Acute right-sided low back pain without sciatica   Primary Care at [place], NP   12 months ago Heart palpitations   Primary Care at [facility], Arlie Solomons, MD   1 year ago Encounter for health maintenance examination in adult   Primary Care at [place], MD       Future Appointments             In 2 months Just, Laurita Quint, FNP Primary Care at [place], [facility]

## 2020-09-07 ENCOUNTER — Encounter: Payer: Self-pay | Admitting: Internal Medicine

## 2020-09-07 ENCOUNTER — Inpatient Hospital Stay: Payer: BC Managed Care – PPO | Admitting: Internal Medicine

## 2020-09-07 ENCOUNTER — Other Ambulatory Visit: Payer: Self-pay

## 2020-09-07 VITALS — BP 167/87 | HR 94 | Temp 98.6°F | Resp 18 | Ht 67.0 in | Wt 164.3 lb

## 2020-09-07 DIAGNOSIS — D563 Thalassemia minor: Secondary | ICD-10-CM

## 2020-09-07 DIAGNOSIS — D509 Iron deficiency anemia, unspecified: Secondary | ICD-10-CM | POA: Diagnosis not present

## 2020-09-07 DIAGNOSIS — D500 Iron deficiency anemia secondary to blood loss (chronic): Secondary | ICD-10-CM

## 2020-09-07 NOTE — Progress Notes (Signed)
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  PRIOR THERAPY: Feraheme infusion on as-needed basis  CURRENT THERAPY: None  INTERVAL HISTORY: Jessica [name] 49 y.o. female returns to the clinic today for 6 months follow-up visit.  The patient is feeling fine today with no concerning complaints.  She denied having any fatigue.  She denied having any dizzy spells.  She has no chest pain, shortness of breath, cough or hemoptysis.  She denied having any nausea, vomiting, diarrhea or constipation.  She has no bleeding issues.  She is here today for evaluation after repeating CBC, iron study and ferritin.   MEDICAL HISTORY: Past Medical History:  Diagnosis Date  . Allergy   . Anemia   . Anxiety   . Depression   . Hyperlipidemia   . MGUS (monoclonal gammopathy of unknown significance)     ALLERGIES:  is allergic to sulfonamide derivatives.  MEDICATIONS:  Current Outpatient Medications  Medication Sig Dispense Refill  . atorvastatin (LIPITOR) 20 MG tablet TAKE 1 TABLET BY MOUTH EVERY DAY 90 tablet 2  . meloxicam (MOBIC) 15 MG tablet TAKE 1 TABLET BY MOUTH EVERY DAY 30 tablet 0  . methocarbamol (ROBAXIN) 500 MG tablet TAKE 1 TABLET (500 MG TOTAL) BY MOUTH 4 (FOUR) TIMES DAILY. 60 tablet 0  . SPRINTEC 28 0.25-35 MG-MCG tablet TAKE 1 TABLET BY MOUTH DAILY. SKIP THE PLACEBO PILLS. 84 tablet 4  . traZODone (DESYREL) 50 MG tablet TAKE 0.5-1 TABLETS (25-50 MG TOTAL) BY MOUTH AT BEDTIME AS NEEDED FOR SLEEP. 90 tablet 1  . venlafaxine XR (EFFEXOR-XR) 37.5 MG 24 hr capsule TAKE 1 CAPSULE BY MOUTH EVERY DAY WITH BREAKFAST 90 capsule 0  . XIIDRA 5 % SOLN      No current facility-administered medications for this  visit.    SURGICAL HISTORY:  Past Surgical History:  Procedure Laterality Date  . TUBAL LIGATION      REVIEW OF SYSTEMS:  A comprehensive review of systems was negative.   PHYSICAL EXAMINATION: General appearance: alert, cooperative and no distress Head: Normocephalic, without obvious abnormality, atraumatic Neck: no adenopathy, no JVD, supple, symmetrical, trachea midline and thyroid not enlarged, symmetric, no tenderness/mass/nodules Lymph nodes: Cervical, supraclavicular, and axillary nodes normal. Resp: clear to auscultation bilaterally Back: symmetric, no curvature. ROM normal. No CVA tenderness. Cardio: regular rate and rhythm, S1, S2 normal, no murmur, click, rub or gallop GI: soft, non-tender; bowel sounds normal; no masses,  no organomegaly Extremities: extremities normal, atraumatic, no cyanosis or edema  ECOG PERFORMANCE STATUS: 1 - Symptomatic but completely ambulatory  Blood pressure (!) 167/87, pulse 94, temperature 98.6 F (37 C), temperature source Tympanic, resp. rate 18, height '5\' 7"'  (1.702 m), weight 164 lb 4.8 oz (74.5 kg), SpO2 100 %.  LABORATORY DATA: Lab Results  Component Value Date   WBC 7.6 08/31/2020   HGB 10.8 (L) 08/31/2020   HCT 34.7 (L) 08/31/2020   MCV 72.9 (L) 08/31/2020   PLT 315 08/31/2020      Chemistry      Component Value Date/Time   NA 142 08/25/2020 0838   K 5.1 08/25/2020 0838   CL 104 08/25/2020 0838   CO2 25 08/25/2020 0838   BUN 12 08/25/2020 0838   CREATININE 1.09 (H) 08/25/2020  7425   CREATININE 1.05 (H) 03/02/2020 1020   CREATININE 0.98 09/10/2017 1333      Component Value Date/Time   CALCIUM 9.4 08/25/2020 0838   ALKPHOS 59 08/25/2020 0838   AST 14 08/25/2020 0838   AST 14 (L) 03/02/2020 1020   ALT 9 08/25/2020 0838   ALT 10 03/02/2020 1020   BILITOT 0.4 08/25/2020 0838   BILITOT 0.3 03/02/2020 1020       RADIOGRAPHIC STUDIES: No results found.  ASSESSMENT AND PLAN: This is a very pleasant 49 years old  African-American female presented for evaluation of microcytic hypochromic anemia likely secondary to beta thalassemia minor as well as iron deficiency.   She was previously treated with Feraheme infusion.  The patient is currently on observation and she is feeling fine. Repeat CBC showed mild microcytic anemia consistent with diabetic thalassemia trait.  Iron study and ferritin are unremarkable for any deficiency. I recommended for the patient to continue on observation with repeat CBC, iron study and ferritin in 6 months. She was advised to call immediately if she has any concerning symptoms in the interval. The patient voices understanding of current disease status and treatment options and is in agreement with the current care plan.  All questions were answered. The patient knows to call the clinic with any problems, questions or concerns. We can certainly see the patient much sooner if necessary.  Disclaimer: This note was dictated with voice recognition software. Similar sounding words can inadvertently be transcribed and may not be corrected upon review.

## 2020-09-08 ENCOUNTER — Telehealth: Payer: Self-pay | Admitting: Internal Medicine

## 2020-09-08 NOTE — Telephone Encounter (Signed)
Scheduled per los. Called and left msg. Mailed printout  °

## 2020-09-13 ENCOUNTER — Ambulatory Visit: Payer: BC Managed Care – PPO | Admitting: Audiologist

## 2020-11-08 ENCOUNTER — Encounter: Payer: BC Managed Care – PPO | Admitting: Family Medicine

## 2020-11-24 ENCOUNTER — Other Ambulatory Visit: Payer: Self-pay

## 2020-11-24 ENCOUNTER — Ambulatory Visit: Payer: BC Managed Care – PPO | Admitting: Family Medicine

## 2020-11-24 ENCOUNTER — Encounter: Payer: Self-pay | Admitting: Family Medicine

## 2020-11-24 VITALS — BP 149/84 | HR 88 | Temp 97.7°F | Ht 67.0 in | Wt 159.0 lb

## 2020-11-24 DIAGNOSIS — F4010 Social phobia, unspecified: Secondary | ICD-10-CM | POA: Diagnosis not present

## 2020-11-24 DIAGNOSIS — Z1211 Encounter for screening for malignant neoplasm of colon: Secondary | ICD-10-CM | POA: Diagnosis not present

## 2020-11-24 DIAGNOSIS — E7800 Pure hypercholesterolemia, unspecified: Secondary | ICD-10-CM

## 2020-11-24 DIAGNOSIS — Z780 Asymptomatic menopausal state: Secondary | ICD-10-CM

## 2020-11-24 DIAGNOSIS — I10 Essential (primary) hypertension: Secondary | ICD-10-CM

## 2020-11-24 MED ORDER — HYDROCHLOROTHIAZIDE 25 MG PO TABS: 25.0000 mg | ORAL_TABLET | Freq: Every day | ORAL | 3 refills | Status: DC

## 2020-11-24 MED ORDER — VENLAFAXINE HCL ER 37.5 MG PO CP24: 75.0000 mg | ORAL_CAPSULE | Freq: Every day | ORAL | 3 refills | Status: DC

## 2020-11-24 MED ORDER — ATORVASTATIN CALCIUM 40 MG PO TABS: 40.0000 mg | ORAL_TABLET | Freq: Every day | ORAL | 2 refills | Status: DC

## 2020-11-24 MED ORDER — PREMARIN 0.625 MG/GM VA CREA: 1.0000 | TOPICAL_CREAM | VAGINAL | 6 refills | Status: DC

## 2020-11-24 NOTE — Progress Notes (Addendum)
1/12/20222:35 PM  Jessica [name] May 23, 1971, 50 y.o., female [id number]  Chief Complaint  Patient presents with  . Transitions Of Care    No concerns per patient     HPI:   Patient is a 50 y.o. female with past medical history significant for iron deficiency and beta thalassemia minor anemia, anxiety, HLP, MGUS who presents today for TOC.  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 Eye yearly  Issues with anemia and fatigue LMP: last month (3rd week Dec) Regular periods, not heavy or painful Tubal ligation in the past  Sciatica type pain: Uses robaxin for pain every night Has been to PT for this Happy with this regimen Takes Trazodone for sleep at night No issues with insomnia, stable on regnien  Anxiety/Panic attacks Effexor same dose 3 years Happy with this dose  HTN Goal< 130/80 BP Readings from Last 3 Encounters:  11/24/20 (!) 149/84  09/07/20 (!) 167/87  08/25/20 (!) 158/84  Creat elev  HLD Atorvastatin 20mg  LDL< 100 Lab Results  Component Value Date   CHOL 225 (H) 08/25/2020   HDL 80 08/25/2020   LDLCALC 129 (H) 08/25/2020   TRIG 90 08/25/2020   CHOLHDL 2.8 08/25/2020   The 10-year ASCVD risk score Mikey Bussing DC Jr., et al., 2013) is: 3.9%   Values used to calculate the score:     Age: 59 years     Sex: Female     Is Non-Hispanic African American: Yes     Diabetic: No     Tobacco smoker: No     Systolic Blood Pressure: 606 mmHg     Is BP treated: Yes     HDL Cholesterol: 80 mg/dL     Total Cholesterol: 225 mg/dL    Health Maintenance  Topic Date Due  . COLONOSCOPY (Pts 45-31yrs Insurance coverage will need to be confirmed)  Never done  . Hepatitis C Screening  08/25/2021 (Originally 20-Sep-1971)  . MAMMOGRAM  01/04/2021  . PAP SMEAR-Modifier  06/15/2024  .  TETANUS/TDAP  06/25/2024  . INFLUENZA VACCINE  Completed  . HIV Screening  Completed     Depression screen [facility] 2/9 11/24/2020 11/24/2020 08/25/2020  Decreased Interest 0 0 0  Down, Depressed, Hopeless 0 0 0  PHQ - 2 Score 0 0 0  Altered sleeping 0 - -  Tired, decreased energy 0 - -  Change in appetite 0 - -  Feeling bad or failure about yourself  0 - -  Trouble concentrating 0 - -  Moving slowly or fidgety/restless 0 - -  Suicidal thoughts 0 - -  PHQ-9 Score 0 - -  Difficult doing work/chores Not difficult at all - -    Fall Risk  11/24/2020 08/25/2020 03/30/2020 09/10/2019 06/16/2019  Falls in the past year? 0 0 0 0 0  Number falls in past yr: 0 0 - 0 0  Injury with Fall? 0 0 - 0 0  Follow up Falls evaluation completed Falls evaluation completed Falls evaluation completed - Falls evaluation completed     Allergies  Allergen Reactions  . Sulfonamide Derivatives     REACTION: hives    Prior to Admission medications   Medication Sig Start Date End Date Taking? Authorizing Provider  atorvastatin (LIPITOR) 20 MG tablet TAKE 1 TABLET BY MOUTH EVERY DAY 07/10/20  Yes Jacelyn Pi, Lilia Argue, MD  methocarbamol (ROBAXIN) 500 MG tablet TAKE 1 TABLET (500 MG TOTAL) BY MOUTH 4 (FOUR) TIMES DAILY. 09/06/20  Yes Maximiano Coss, NP  traZODone (DESYREL) 50 MG tablet TAKE 0.5-1 TABLETS (25-50 MG TOTAL) BY MOUTH AT BEDTIME AS NEEDED FOR SLEEP. 04/21/20  Yes Jacelyn Pi, Irma M, MD  venlafaxine XR (EFFEXOR-XR) 37.5 MG 24 hr capsule TAKE 1 CAPSULE BY MOUTH EVERY DAY WITH BREAKFAST 09/03/20  Yes Maximiano Coss, NP    Past Medical History:  Diagnosis Date  . Allergy   . Anemia   . Anxiety   . Depression   . Hyperlipidemia   . MGUS (monoclonal gammopathy of unknown significance)     Past Surgical History:  Procedure Laterality Date  . TUBAL LIGATION      Social History   Tobacco Use  . Smoking status: Never Smoker  . Smokeless tobacco: Never Used  Substance Use Topics  . Alcohol  use: No    Alcohol/week: 0.0 standard drinks    Family History  Problem Relation Age of Onset  . Alcohol abuse Father   . Drug abuse Father   . Hyperlipidemia Father   . Hypertension Father   . Alcohol abuse Maternal Uncle   . Heart disease Mother   . Hyperlipidemia Mother   . Hypertension Mother   . Heart disease Maternal Grandmother   . Hypertension Maternal Grandmother   . Hyperlipidemia Maternal Grandmother   . Cancer Paternal Grandmother   . Diabetes Paternal Grandmother   . Hypertension Paternal Grandmother   . Cancer Paternal Grandfather   . Breast cancer Neg Hx     Review of Systems  Constitutional: Positive for malaise/fatigue. Negative for chills and fever.  Eyes: Negative for blurred vision and double vision.  Respiratory: Negative for cough, shortness of breath and wheezing.   Cardiovascular: Negative for chest pain, palpitations and leg swelling.  Gastrointestinal: Negative for abdominal pain, blood in stool, constipation, diarrhea, heartburn, nausea and vomiting.  Genitourinary: Negative for dysuria, frequency and hematuria.  Musculoskeletal: Negative for back pain and joint pain.  Skin: Negative for rash.  Neurological: Negative for dizziness, weakness and headaches.  Psychiatric/Behavioral: The patient is nervous/anxious. The patient does not have insomnia.      OBJECTIVE:  Today's Vitals   11/24/20 1352  BP: (!) 149/84  Pulse: 88  Temp: 97.7 F (36.5 C)  SpO2: 100%  Weight: 159 lb (72.1 kg)  Height: 5\' 7"  (1.702 m)   Body mass index is 24.9 kg/m.   Physical Exam Constitutional:      General: She is not in acute distress.    Appearance: Normal appearance. She is not ill-appearing.  HENT:     Head: Normocephalic.  Cardiovascular:     Rate and Rhythm: Normal rate and regular rhythm.     Pulses: Normal pulses.     Heart sounds: Normal heart sounds. No murmur heard. No friction rub. No gallop.   Pulmonary:     Effort: Pulmonary effort is  normal. No respiratory distress.     Breath sounds: Normal breath sounds. No stridor. No wheezing, rhonchi or rales.  Abdominal:     General: Bowel sounds are normal.     Palpations: Abdomen is soft.     Tenderness: There is no abdominal tenderness.  Musculoskeletal:     Right lower leg: No edema.     Left lower leg: No edema.  Skin:    General: Skin is warm and dry.  Neurological:  Mental Status: She is alert and oriented to person, place, and time.  Psychiatric:        Mood and Affect: Mood normal.        Behavior: Behavior normal.     No results found for this or any previous visit (from the past 24 hour(s)).  No results found.   ASSESSMENT and PLAN  Problem List Items Addressed This Visit      Other   HYPERCHOLESTEROLEMIA - Primary   Relevant Medications   atorvastatin (LIPITOR) 40 MG tablet   hydrochlorothiazide (HYDRODIURIL) 25 MG tablet   Social anxiety disorder   Relevant Medications   venlafaxine XR (EFFEXOR-XR) 37.5 MG 24 hr capsule    Other Visit Diagnoses    Special screening for malignant neoplasms, colon       Relevant Orders   Cologuard   Essential hypertension       Relevant Medications   atorvastatin (LIPITOR) 40 MG tablet   hydrochlorothiazide (HYDRODIURIL) 25 MG tablet   Menopause       Relevant Medications   conjugated estrogens (PREMARIN) vaginal cream     Plan  Start HCTZ for BP  Increase Atorvastatin from 20 to 40  Start Premarin weekly to three times a week for vaginal dryness  Increase Effexor from 37.5 to 75  Stop Iron, will recheck labs at next appointment  Discussed alt. Treatments for hot flashes: soy, black cohosh  Ordered Cologuard  Return in about 4 months (around 03/24/2021).    Huston Foley Crescencio Jozwiak, FNP-BC [facility] Care at [place], [place] [address] Ph.  [phone] Fax [phone]  I have reviewed and agree with above documentation. Agustina Caroli, MD

## 2020-11-24 NOTE — Patient Instructions (Addendum)
https://www.womenshealth.gov/menopause/menopause-basics"> https://www.clinicalkey.com">  Menopause Menopause is the normal time of a woman's life when menstrual periods stop completely. It marks the natural end to a woman's ability to become pregnant. It can be defined as the absence of a menstrual period for 12 months without another medical cause. The transition to menopause (perimenopause) most often happens between the ages of 45 and 55, and can last for many years. During perimenopause, hormone levels change in your body, which can cause symptoms and affect your health. Menopause may increase your risk for:  Weakened bones (osteoporosis), which causes fractures.  Depression.  Hardening and narrowing of the arteries (atherosclerosis), which can cause heart attacks and strokes. What are the causes? This condition is usually caused by a natural change in hormone levels that happens as you get older. The condition may also be caused by changes that are not natural, including:  Surgery to remove both ovaries (surgical menopause).  Side effects from some medicines, such as chemotherapy used to treat cancer (chemical menopause). What increases the risk? This condition is more likely to start at an earlier age if you have certain medical conditions or have undergone treatments, including:  A tumor of the pituitary gland in the brain.  A disease that affects the ovaries and hormones.  Certain cancer treatments, such as chemotherapy or hormone therapy, or radiation therapy on the pelvis.  Heavy smoking and excessive alcohol use.  Family history of early menopause. This condition is also more likely to develop earlier in women who are very thin. What are the signs or symptoms? Symptoms of this condition include:  Hot flashes.  Irregular menstrual periods.  Night sweats.  Changes in feelings about sex. This could be a decrease in sex drive or an increased discomfort around your  sexuality.  Vaginal dryness and thinning of the vaginal walls. This may cause painful sex.  Dryness of the skin and development of wrinkles.  Headaches.  Problems sleeping (insomnia).  Mood swings or irritability.  Memory problems.  Weight gain.  Hair growth on the face and chest.  Bladder infections or problems with urinating. How is this diagnosed? This condition is diagnosed based on your medical history, a physical exam, your age, your menstrual history, and your symptoms. Hormone tests may also be done. How is this treated? In some cases, no treatment is needed. You and your health care provider should make a decision together about whether treatment is necessary. Treatment will be based on your individual condition and preferences. Treatment for this condition focuses on managing symptoms. Treatment may include:  Menopausal hormone therapy (MHT).  Medicines to treat specific symptoms or complications.  Acupuncture.  Vitamin or herbal supplements. Before starting treatment, make sure to let your health care provider know if you have a personal or family history of these conditions:  Heart disease.  Breast cancer.  Blood clots.  Diabetes.  Osteoporosis. Follow these instructions at home: Lifestyle  Do not use any products that contain nicotine or tobacco, such as cigarettes, e-cigarettes, and chewing tobacco. If you need help quitting, ask your health care provider.  Get at least 30 minutes of physical activity on 5 or more days each week.  Avoid alcoholic and caffeinated beverages, as well as spicy foods. This may help prevent hot flashes.  Get 7-8 hours of sleep each night.  If you have hot flashes, try: ? Dressing in layers. ? Avoiding things that may trigger hot flashes, such as spicy food, warm places, or stress. ? Taking slow, deep   breaths when a hot flash starts. ? Keeping a fan in your home and office.  Find ways to manage stress, such as deep  breathing, meditation, or journaling.  Consider going to group therapy with other women who are having menopause symptoms. Ask your health care provider about recommended group therapy meetings. Eating and drinking  Eat a healthy, balanced diet that contains whole grains, lean protein, low-fat dairy, and plenty of fruits and vegetables.  Your health care provider may recommend adding more soy to your diet. Foods that contain soy include tofu, tempeh, and soy milk.  Eat plenty of foods that contain calcium and vitamin D for bone health. Items that are rich in calcium include low-fat milk, yogurt, beans, almonds, sardines, broccoli, and kale.   Medicines  Take over-the-counter and prescription medicines only as told by your health care provider.  Talk with your health care provider before starting any herbal supplements. If prescribed, take vitamins and supplements as told by your health care provider. General instructions  Keep track of your menstrual periods, including: ? When they occur. ? How heavy they are and how long they last. ? How much time passes between periods.  Keep track of your symptoms, noting when they start, how often you have them, and how long they last.  Use vaginal lubricants or moisturizers to help with vaginal dryness and improve comfort during sex.  Keep all follow-up visits. This is important. This includes any group therapy or counseling.   Contact a health care provider if:  You are still having menstrual periods after age 55.  You have pain during sex.  You have not had a period for 12 months and you develop vaginal bleeding. Get help right away if you have:  Severe depression.  Excessive vaginal bleeding.  Pain when you urinate.  A fast or irregular heartbeat (palpitations).  Severe headaches.  Abdominal pain or severe indigestion. Summary  Menopause is a normal time of life when menstrual periods stop completely. It is usually defined as  the absence of a menstrual period for 12 months without another medical cause.  The transition to menopause (perimenopause) most often happens between the ages of 45 and 55 and can last for several years.  Symptoms can be managed through medicines, lifestyle changes, and complementary therapies such as acupuncture.  Eat a balanced diet that is rich in nutrients to promote bone health and heart health and to manage symptoms during menopause. This information is not intended to replace advice given to you by your health care provider. Make sure you discuss any questions you have with your health care provider. Document Revised: 07/30/2020 Document Reviewed: 04/15/2020 Elsevier Patient Education  2021 Elsevier Inc.   If you have lab work done today you will be contacted with your lab results within the next 2 weeks.  If you have not heard from us then please contact us. The fastest way to get your results is to register for My Chart.   IF you received an x-ray today, you will receive an invoice from [facility]. Please contact [facility] at [phone] with questions or concerns regarding your invoice.   IF you received labwork today, you will receive an invoice from [facility]. Please contact [facility] at [phone] with questions or concerns regarding your invoice.   Our billing staff will not be able to assist you with questions regarding bills from these companies.  You will be contacted with the lab results as soon as they are available. The fastest   way to get your results is to activate your My Chart account. Instructions are located on the last page of this paperwork. If you have not heard from [place] regarding the results in 2 weeks, please contact this office.

## 2020-11-25 ENCOUNTER — Telehealth: Payer: Self-pay | Admitting: Family Medicine

## 2020-11-25 ENCOUNTER — Other Ambulatory Visit: Payer: Self-pay | Admitting: Family Medicine

## 2020-11-25 ENCOUNTER — Other Ambulatory Visit: Payer: Self-pay | Admitting: Registered Nurse

## 2020-11-25 DIAGNOSIS — M545 Low back pain, unspecified: Secondary | ICD-10-CM

## 2020-11-25 DIAGNOSIS — F4010 Social phobia, unspecified: Secondary | ICD-10-CM

## 2020-11-25 MED ORDER — METHOCARBAMOL 500 MG PO TABS: 500.0000 mg | ORAL_TABLET | Freq: Four times a day (QID) | ORAL | 0 refills | Status: DC

## 2020-11-25 MED ORDER — VENLAFAXINE HCL ER 37.5 MG PO CP24: 75.0000 mg | ORAL_CAPSULE | Freq: Every day | ORAL | 3 refills | Status: DC

## 2020-11-25 NOTE — Telephone Encounter (Signed)
Patient is requesting generic on  conjugated estrogens (PREMARIN) vaginal cream [[id number]]  Because this one cost too much  And  venlafaxine XR (EFFEXOR-XR) 37.5 MG 24 hr capsule [[id number]]  The need clarification on the way it is written   Because the way it is written the insurance company may have a question   [id number] [place], [place]  [address] [place], [place] [address]  Phone:  [phone] Fax:  [phone]  Pharmacy phone/ [phone]     Please advise

## 2020-11-25 NOTE — Telephone Encounter (Signed)
Pt requesting med change due to cost. Can you please make this change thank you

## 2020-11-25 NOTE — Telephone Encounter (Signed)
Pt understood. She asked if theres anything she can do in place of premarin, states it is over $100 she will not be able to get this. Pt also asking if she is supposed to be increasing dose of Effexor as she goes direction say 2 tablet daily but quantity 90 is she titrating this?

## 2020-11-25 NOTE — Telephone Encounter (Signed)
Pt open to the pills hoping this will be a lower cost

## 2020-11-25 NOTE — Telephone Encounter (Signed)
She should take the Effexor 2 tabs daily. No titration. As substitutes there is a patch or daily pill. Which would she prefer?

## 2020-11-25 NOTE — Telephone Encounter (Signed)
I fixed the Effexor, For the premarin its sadly an oddly expensive medication and their really is no other substitute for it

## 2020-11-26 ENCOUNTER — Other Ambulatory Visit: Payer: Self-pay | Admitting: Family Medicine

## 2020-11-26 ENCOUNTER — Encounter: Payer: Self-pay | Admitting: Family Medicine

## 2020-11-26 ENCOUNTER — Other Ambulatory Visit: Payer: Self-pay | Admitting: Registered Nurse

## 2020-11-26 DIAGNOSIS — M545 Low back pain, unspecified: Secondary | ICD-10-CM

## 2020-11-26 DIAGNOSIS — F4010 Social phobia, unspecified: Secondary | ICD-10-CM

## 2020-11-26 MED ORDER — VENLAFAXINE HCL ER 75 MG PO CP24: 75.0000 mg | ORAL_CAPSULE | Freq: Every day | ORAL | 3 refills | Status: DC

## 2020-11-26 NOTE — Telephone Encounter (Signed)
Pt Called needing the conjugated estrogens (PREMARIN) vaginal cream [[id number]]  Sent in as pill form due to the cost. Pt also states that the pharmacy does not have the Rx that were sent on 1/12 and 1/13 need to be resent to the pharmacy. Please advise.

## 2020-11-26 NOTE — Telephone Encounter (Signed)
Requested medication (s) are due for refill today:   See note sent from pharmacy  Requested medication (s) are on the active medication list:   Yes  Future visit scheduled:   Yes   Last ordered: Insurance will not cover this drug.   Phar. Requesting an alternative.  They won't cover 2 capsules a day.   Requesting a new Rx for 75 mg  1 capsule a day.   Thanks.   Requested Prescriptions  Pending Prescriptions Disp Refills   venlafaxine XR (EFFEXOR-XR) 37.5 MG 24 hr capsule [Pharmacy Med Name: VENLAFAXINE HCL ER 37.5 MG CAP] 90 capsule 3    Sig: Take 2 capsules (75 mg total) by mouth daily with breakfast.      Psychiatry: Antidepressants - SNRI - desvenlafaxine & venlafaxine Failed - 11/26/2020  1:17 PM      Failed - LDL in normal range and within 360 days    LDL Cholesterol (Calc)  Date Value Ref Range Status  06/16/2019 175 (H) mg/dL (calc) Final    Comment:    Reference range: <100 . Desirable range <100 mg/dL for primary prevention;   <70 mg/dL for patients with CHD or diabetic patients  with > or = 2 CHD risk factors. Marland Kitchen LDL-C is now calculated using the Martin-Hopkins  calculation, which is a validated novel method providing  better accuracy than the Friedewald equation in the  estimation of LDL-C.  Cresenciano Genre et al. Annamaria Helling. 1443;154(00): 2061-2068  (http://education.[employer].com/faq/FAQ164)    LDL Chol Calc (NIH)  Date Value Ref Range Status  08/25/2020 129 (H) 0 - 99 mg/dL Final          Failed - Total Cholesterol in normal range and within 360 days    Cholesterol, Total  Date Value Ref Range Status  08/25/2020 225 (H) 100 - 199 mg/dL Final          Failed - Last BP in normal range    BP Readings from Last 1 Encounters:  11/24/20 (!) 149/84          Passed - Triglycerides in normal range and within 360 days    Triglycerides  Date Value Ref Range Status  08/25/2020 90 0 - 149 mg/dL Final          Passed - Completed PHQ-2 or PHQ-9 in the last 360  days      Passed - Valid encounter within last 6 months    Recent Outpatient Visits           2 days ago HYPERCHOLESTEROLEMIA   [facility] at [place], Laurita Quint, [place]   3 months ago Annual physical exam   [facility], Delfino Lovett, NP   7 months ago [place], GENERALIZED   Primary Care at [facility], Lilia Argue, MD   8 months ago Acute right-sided low back pain without sciatica   [facility], Delfino Lovett, NP   1 year ago Heart palpitations   [facility] at [place], MD       Future Appointments             In 3 months Just, Laurita Quint, FNP [facility] at [place], [facility]

## 2020-11-26 NOTE — Telephone Encounter (Signed)
It looks like the pill will be Detrick Dani as expensive as the vaginal cream given there is no generic version. What prescriptions need to be re-sent to the pharmacy? On my side it says everything go sent to the [facility] on [address].

## 2020-11-26 NOTE — Telephone Encounter (Signed)
Requested medication (s) are due for refill today:  No  Requested medication (s) are on the active medication list:   Yes  Future visit scheduled:   Yes  Just seen a day or two ago   Last ordered: 11/25/2020 #60, 0 refills  Returned due to it being a non delegated refill.   Requested Prescriptions  Pending Prescriptions Disp Refills   methocarbamol (ROBAXIN) 500 MG tablet [Pharmacy Med Name: METHOCARBAMOL 500 MG TABLET] 60 tablet 0    Sig: Take 1 tablet (500 mg total) by mouth 4 (four) times daily.      Not Delegated - Analgesics:  Muscle Relaxants Failed - 11/26/2020  1:03 PM      Failed - This refill cannot be delegated      Passed - Valid encounter within last 6 months    Recent Outpatient Visits           2 days ago HYPERCHOLESTEROLEMIA   [facility] at [place], Laurita Quint, [place]   3 months ago Annual physical exam   [facility], Delfino Lovett, NP   7 months ago [place], GENERALIZED   Primary Care at [facility], Lilia Argue, MD   8 months ago Acute right-sided low back pain without sciatica   [facility], Delfino Lovett, NP   1 year ago Heart palpitations   [facility] at [place], MD       Future Appointments             In 3 months Just, Laurita Quint, FNP Primary Care at [place], [facility]

## 2020-11-26 NOTE — Telephone Encounter (Signed)
Pt. Called requesting provider's medical assistant call her to help resolve issues around medication perscription. Pt. Was frustrated and unable to articulate needs to clerical staff.

## 2020-11-29 ENCOUNTER — Other Ambulatory Visit: Payer: Self-pay | Admitting: Family Medicine

## 2020-11-29 DIAGNOSIS — G4700 Insomnia, unspecified: Secondary | ICD-10-CM

## 2020-11-29 DIAGNOSIS — I10 Essential (primary) hypertension: Secondary | ICD-10-CM

## 2020-11-29 DIAGNOSIS — M545 Low back pain, unspecified: Secondary | ICD-10-CM

## 2020-11-29 DIAGNOSIS — F4010 Social phobia, unspecified: Secondary | ICD-10-CM

## 2020-11-29 DIAGNOSIS — E7800 Pure hypercholesterolemia, unspecified: Secondary | ICD-10-CM

## 2020-11-29 MED ORDER — TRAZODONE HCL 50 MG PO TABS: 25.0000 mg | ORAL_TABLET | Freq: Every evening | ORAL | 1 refills | Status: DC | PRN

## 2020-11-29 MED ORDER — ATORVASTATIN CALCIUM 40 MG PO TABS: 40.0000 mg | ORAL_TABLET | Freq: Every day | ORAL | 2 refills | Status: DC

## 2020-11-29 MED ORDER — HYDROCHLOROTHIAZIDE 25 MG PO TABS: 25.0000 mg | ORAL_TABLET | Freq: Every day | ORAL | 3 refills | Status: DC

## 2020-11-29 MED ORDER — METHOCARBAMOL 500 MG PO TABS: 500.0000 mg | ORAL_TABLET | Freq: Four times a day (QID) | ORAL | 0 refills | Status: DC

## 2020-11-29 MED ORDER — VENLAFAXINE HCL ER 75 MG PO CP24: 75.0000 mg | ORAL_CAPSULE | Freq: Every day | ORAL | 3 refills | Status: DC

## 2020-11-29 NOTE — Telephone Encounter (Signed)
She stated the pharmacy did not have any of her medications were proscribed from the visit. I saw in her chart they were sent and I told pt that but she stated she had just gotten off the phone with them and they didn't have them.

## 2020-11-29 NOTE — Telephone Encounter (Signed)
[facility] [address].

## 2020-12-06 ENCOUNTER — Other Ambulatory Visit: Payer: Self-pay | Admitting: Family Medicine

## 2020-12-06 DIAGNOSIS — Z1231 Encounter for screening mammogram for malignant neoplasm of breast: Secondary | ICD-10-CM

## 2020-12-08 ENCOUNTER — Telehealth: Payer: Self-pay | Admitting: Medical Oncology

## 2020-12-08 NOTE — Telephone Encounter (Signed)
Records requested from the last 3 years for life insurance application.

## 2020-12-11 ENCOUNTER — Other Ambulatory Visit: Payer: Self-pay | Admitting: Family Medicine

## 2020-12-11 DIAGNOSIS — E7800 Pure hypercholesterolemia, unspecified: Secondary | ICD-10-CM

## 2020-12-11 NOTE — Telephone Encounter (Signed)
Requested Prescriptions  Pending Prescriptions Disp Refills  . atorvastatin (LIPITOR) 40 MG tablet [Pharmacy Med Name: ATORVASTATIN 40 MG TABLET] 90 tablet 2    Sig: Take 1 tablet (40 mg total) by mouth daily.     Cardiovascular:  Antilipid - Statins Failed - 12/11/2020  9:44 AM      Failed - Total Cholesterol in normal range and within 360 days    Cholesterol, Total  Date Value Ref Range Status  08/25/2020 225 (H) 100 - 199 mg/dL Final         Failed - LDL in normal range and within 360 days    LDL Cholesterol (Calc)  Date Value Ref Range Status  06/16/2019 175 (H) mg/dL (calc) Final    Comment:    Reference range: <100 . Desirable range <100 mg/dL for primary prevention;   <70 mg/dL for patients with CHD or diabetic patients  with > or = 2 CHD risk factors. Marland Kitchen LDL-C is now calculated using the Martin-Hopkins  calculation, which is a validated novel method providing  better accuracy than the Friedewald equation in the  estimation of LDL-C.  Cresenciano Genre et al. Annamaria Helling. 2993;716(96): 2061-2068  (http://education.[employer].com/faq/FAQ164)    LDL Chol Calc (NIH)  Date Value Ref Range Status  08/25/2020 129 (H) 0 - 99 mg/dL Final         Passed - HDL in normal range and within 360 days    HDL  Date Value Ref Range Status  08/25/2020 80 >39 mg/dL Final         Passed - Triglycerides in normal range and within 360 days    Triglycerides  Date Value Ref Range Status  08/25/2020 90 0 - 149 mg/dL Final         Passed - Patient is not pregnant      Passed - Valid encounter within last 12 months    Recent Outpatient Visits          2 weeks ago HYPERCHOLESTEROLEMIA   [facility] at [facility], Laurita Quint, [place]   3 months ago Annual physical exam   [facility], Delfino Lovett, NP   7 months ago [place], GENERALIZED   Primary Care at [facility], Lilia Argue, MD   8 months ago Acute right-sided low back pain without sciatica   [facility] at  [facility], Delfino Lovett, NP   1 year ago Heart palpitations   Primary [facility] at [place], MD      Future Appointments            In 3 months Just, Laurita Quint, FNP Primary Care at [place], [facility]

## 2021-01-09 ENCOUNTER — Other Ambulatory Visit: Payer: Self-pay | Admitting: Family Medicine

## 2021-01-09 DIAGNOSIS — M545 Low back pain, unspecified: Secondary | ICD-10-CM

## 2021-01-09 NOTE — Telephone Encounter (Signed)
Requested medication (s) are due for refill today: yes  Requested medication (s) are on the active medication list: yes  Last refill:  11/29/20  Future visit scheduled: yes  Notes to clinic:  med not delegated to NT to RF   Requested Prescriptions  Pending Prescriptions Disp Refills   methocarbamol (ROBAXIN) 500 MG tablet [Pharmacy Med Name: METHOCARBAMOL 500 MG TABLET] 60 tablet 0    Sig: TAKE 1 TABLET BY MOUTH 4 TIMES DAILY.      Not Delegated - Analgesics:  Muscle Relaxants Failed - 01/09/2021 11:47 AM      Failed - This refill cannot be delegated      Passed - Valid encounter within last 6 months    Recent Outpatient Visits           1 month ago HYPERCHOLESTEROLEMIA   [facility] at [place], Laurita Quint, [place]   4 months ago Annual physical exam   [facility], Delfino Lovett, [place]   8 months ago [place], GENERALIZED   Primary Care at [facility], Lilia Argue, MD   9 months ago Acute right-sided low back pain without sciatica   [facility], Delfino Lovett, NP   1 year ago Heart palpitations   Primary Care at [place], MD       Future Appointments             In 2 months Just, Laurita Quint, FNP Primary Care at [place], [facility]

## 2021-01-10 NOTE — Telephone Encounter (Signed)
Patient is requesting a refill of the following medications: Requested Prescriptions   Pending Prescriptions Disp Refills  . methocarbamol (ROBAXIN) 500 MG tablet [Pharmacy Med Name: METHOCARBAMOL 500 MG TABLET] 60 tablet 0    Sig: TAKE 1 TABLET BY MOUTH 4 TIMES DAILY.    Date of patient request: 01/09/2021 Last office visit: 11/24/2020 Date of last refill: 11/29/2020 Last refill amount: 60 tablets  Follow up time period per chart: 03/24/2021

## 2021-01-18 ENCOUNTER — Ambulatory Visit
Admission: RE | Admit: 2021-01-18 | Discharge: 2021-01-18 | Disposition: A | Discharge status: Home / Self Care | Payer: BC Managed Care – PPO | Source: Ambulatory Visit | Attending: Family Medicine | Admitting: Family Medicine

## 2021-01-18 ENCOUNTER — Other Ambulatory Visit: Payer: Self-pay

## 2021-01-18 DIAGNOSIS — Z1231 Encounter for screening mammogram for malignant neoplasm of breast: Secondary | ICD-10-CM | POA: Diagnosis not present

## 2021-01-21 ENCOUNTER — Telehealth: Payer: Self-pay | Admitting: Family Medicine

## 2021-01-21 NOTE — Telephone Encounter (Signed)
I shredded it.

## 2021-01-21 NOTE — Telephone Encounter (Signed)
Patient called and ask [place] to disregard medical release that she dropped off today as she is going to establish lish acre at another [facility] facility .

## 2021-01-27 ENCOUNTER — Other Ambulatory Visit: Payer: Self-pay | Admitting: Family Medicine

## 2021-01-27 DIAGNOSIS — M545 Low back pain, unspecified: Secondary | ICD-10-CM

## 2021-01-27 NOTE — Telephone Encounter (Signed)
Requested medications are due for refill today.  yes  Requested medications are on the active medications list.  yes  Last refill. 01/10/2021  Future visit scheduled.   yes  Notes to clinic.  Not delegated

## 2021-03-04 ENCOUNTER — Inpatient Hospital Stay: Payer: BC Managed Care – PPO | Attending: Internal Medicine

## 2021-03-04 ENCOUNTER — Other Ambulatory Visit: Payer: Self-pay

## 2021-03-04 DIAGNOSIS — D500 Iron deficiency anemia secondary to blood loss (chronic): Secondary | ICD-10-CM

## 2021-03-04 DIAGNOSIS — D509 Iron deficiency anemia, unspecified: Secondary | ICD-10-CM | POA: Insufficient documentation

## 2021-03-04 LAB — CBC WITH DIFFERENTIAL (CANCER CENTER ONLY)
Abs Immature Granulocytes: 0.01 10*3/uL (ref 0.00–0.07)
Basophils Absolute: 0.1 10*3/uL (ref 0.0–0.1)
Basophils Relative: 1 %
Eosinophils Absolute: 0.1 10*3/uL (ref 0.0–0.5)
Eosinophils Relative: 1 %
HCT: 34.3 % — ABNORMAL LOW (ref 36.0–46.0)
Hemoglobin: 10.3 g/dL — ABNORMAL LOW (ref 12.0–15.0)
Immature Granulocytes: 0 %
Lymphocytes Relative: 32 %
Lymphs Abs: 2.8 10*3/uL (ref 0.7–4.0)
MCH: 22.6 pg — ABNORMAL LOW (ref 26.0–34.0)
MCHC: 30 g/dL (ref 30.0–36.0)
MCV: 75.4 fL — ABNORMAL LOW (ref 80.0–100.0)
Monocytes Absolute: 0.6 10*3/uL (ref 0.1–1.0)
Monocytes Relative: 7 %
Neutro Abs: 5 10*3/uL (ref 1.7–7.7)
Neutrophils Relative %: 59 %
Platelet Count: 286 10*3/uL (ref 150–400)
RBC: 4.55 MIL/uL (ref 3.87–5.11)
RDW: 16.2 % — ABNORMAL HIGH (ref 11.5–15.5)
WBC Count: 8.6 10*3/uL (ref 4.0–10.5)
nRBC: 0 % (ref 0.0–0.2)

## 2021-03-04 LAB — IRON AND TIBC
Iron: 76 ug/dL (ref 41–142)
Saturation Ratios: 33 % (ref 21–57)
TIBC: 229 ug/dL — ABNORMAL LOW (ref 236–444)
UIBC: 152 ug/dL (ref 120–384)

## 2021-03-04 LAB — FERRITIN: Ferritin: 523 ng/mL — ABNORMAL HIGH (ref 11–307)

## 2021-03-07 ENCOUNTER — Ambulatory Visit: Payer: BC Managed Care – PPO | Admitting: Family Medicine

## 2021-03-07 ENCOUNTER — Other Ambulatory Visit: Payer: Self-pay

## 2021-03-07 ENCOUNTER — Encounter: Payer: Self-pay | Admitting: Family Medicine

## 2021-03-07 VITALS — BP 120/68 | HR 91 | Ht 67.0 in | Wt 155.0 lb

## 2021-03-07 DIAGNOSIS — I10 Essential (primary) hypertension: Secondary | ICD-10-CM

## 2021-03-07 DIAGNOSIS — R6882 Decreased libido: Secondary | ICD-10-CM

## 2021-03-07 DIAGNOSIS — F419 Anxiety disorder, unspecified: Secondary | ICD-10-CM

## 2021-03-07 DIAGNOSIS — D472 Monoclonal gammopathy: Secondary | ICD-10-CM

## 2021-03-07 DIAGNOSIS — M25552 Pain in left hip: Secondary | ICD-10-CM

## 2021-03-07 DIAGNOSIS — E7800 Pure hypercholesterolemia, unspecified: Secondary | ICD-10-CM

## 2021-03-07 DIAGNOSIS — N951 Menopausal and female climacteric states: Secondary | ICD-10-CM

## 2021-03-07 DIAGNOSIS — D500 Iron deficiency anemia secondary to blood loss (chronic): Secondary | ICD-10-CM

## 2021-03-07 DIAGNOSIS — F32A Depression, unspecified: Secondary | ICD-10-CM | POA: Diagnosis not present

## 2021-03-07 DIAGNOSIS — G4700 Insomnia, unspecified: Secondary | ICD-10-CM

## 2021-03-07 DIAGNOSIS — M25551 Pain in right hip: Secondary | ICD-10-CM

## 2021-03-07 NOTE — Progress Notes (Signed)
Subjective:    Patient ID: Jessica [name], female    DOB: 04/08/1971, 50 y.o.   MRN: [id number]  HPI Chief Complaint  Patient presents with  . new pt     New pt get established. Perimenosaupal- issues with sex and dryness, and anemia   She is new to the practice and here to establish care.  She has several concerns today. Previous medical care: [place]  Other providers: Dr. Mohamed-oncology  HL-states her previous PCP increased her atorvastatin due to elevated LDL and she has not had repeat lipid panel.  She is fasting and would like to have this done today.  HTN-reports taking hydrochlorothiazide daily without any issues.  Anemia-IDA related to menorrhagia.  She has been getting iron infusions for the past 3 years   States she has tried birth control in the past to help stop her cycles but she did not like how she felt and the weight gain associated with hormones.  MGUS and IDA managed by Dr. Earlie Server.  She takes trazodone for sleep and this works well.  Vaginal dryness and headaches related to menopause per patient. States she also has a low sex drive.  Left hip pain that was doing better while she was involved in physical therapy.  History of anxiety, depression, OCD and suicidal ideation.  She denies SI today. States her mood is stable and she is doing well on venlafaxine XR.  Is not currently involved in counseling.  Mother died from MI at age 45.  States she had a cardiology work-up 2 years ago.   Last Menstrual cycle: 02/17/2021  Denies fever, chills, dizziness, chest pain, palpitations, shortness of breath, abdominal pain, N/V/D, urinary symptoms, LE edema.    Reviewed allergies, medications, past medical, surgical, family, and social history.    Review of Systems Pertinent positives and negatives in the history of present illness.     Objective:   Physical Exam BP 120/68   Pulse 91   Ht 5\' 7"  (1.702 m)   Wt 155 lb (70.3 kg)   LMP 02/17/2021    SpO2 99%   BMI 24.28 kg/m   Alert and in no distress.  Cardiac exam shows a regular sinus rhythm without murmurs or gallops. Lungs are clear to auscultation.  Extremities without edema.  Skin is warm and dry.  Normal speech, mood and behavior.       Assessment & Plan:  Essential hypertension - Plan: Comprehensive metabolic panel -Blood pressure well controlled.  Continue current medication and check CMP.  HYPERCHOLESTEROLEMIA - Plan: Lipid panel -Reportedly her PCP increased her Lipitor at her previous visit and she has not had a follow-up lipid panel.  She is fasting I will check this today.  Anxiety and depression - Plan: TSH, T4, free -She is reportedly doing well with her mood.  Continue venlafaxine XR.  She is currently not involved in counseling and I did recommend this if she starts to have issues with mood.  Menopausal vaginal dryness - Plan: TSH, T4, free -Recommend getting an over-the-counter daily moisturizer and using lubrication during sexual activity.  Decreased libido - Plan: TSH, T4, free -Discussed that the painful intercourse she has been having due to vaginal dryness may be causing her decreased libido.  It may be that if she is able to enjoy sexual activity again that her libido may improve.  Hip pain, bilateral -Recommend she start doing exercises that she learned in PT since this has helped in the past  Iron deficiency anemia  due to chronic blood loss -She will continue to follow-up with Dr. Earlie Server.  Reviewed recent labs.  MGUS (monoclonal gammopathy of unknown significance) -Continue to follow up with Dr. Julien Nordmann.  Insomnia, unspecified type -Doing well on trazodone and may continue

## 2021-03-07 NOTE — Patient Instructions (Addendum)
You can try an over-the-counter moisturizer such as Replens to help with dryness. You can use all of oil or coconut oil to help with lubrication during activity.  I recommend that you start back doing the PT exercises for hip pain.  Continue on your current medications and I will be in touch with your lab results.

## 2021-03-08 ENCOUNTER — Inpatient Hospital Stay: Payer: BC Managed Care – PPO | Admitting: Internal Medicine

## 2021-03-08 VITALS — BP 138/82 | HR 99 | Temp 97.5°F | Resp 18 | Ht 67.0 in | Wt 154.8 lb

## 2021-03-08 DIAGNOSIS — D500 Iron deficiency anemia secondary to blood loss (chronic): Secondary | ICD-10-CM

## 2021-03-08 DIAGNOSIS — D563 Thalassemia minor: Secondary | ICD-10-CM

## 2021-03-08 DIAGNOSIS — D509 Iron deficiency anemia, unspecified: Secondary | ICD-10-CM | POA: Diagnosis not present

## 2021-03-08 LAB — COMPREHENSIVE METABOLIC PANEL
ALT: 13 IU/L (ref 0–32)
AST: 19 IU/L (ref 0–40)
Albumin/Globulin Ratio: 1.4 (ref 1.2–2.2)
Albumin: 4.6 g/dL (ref 3.8–4.8)
Alkaline Phosphatase: 82 IU/L (ref 44–121)
BUN/Creatinine Ratio: 12 (ref 9–23)
BUN: 13 mg/dL (ref 6–24)
Bilirubin Total: 0.3 mg/dL (ref 0.0–1.2)
CO2: 24 mmol/L (ref 20–29)
Calcium: 10.2 mg/dL (ref 8.7–10.2)
Chloride: 100 mmol/L (ref 96–106)
Creatinine, Ser: 1.08 mg/dL — ABNORMAL HIGH (ref 0.57–1.00)
Globulin, Total: 3.2 g/dL (ref 1.5–4.5)
Glucose: 89 mg/dL (ref 65–99)
Potassium: 5.1 mmol/L (ref 3.5–5.2)
Sodium: 141 mmol/L (ref 134–144)
Total Protein: 7.8 g/dL (ref 6.0–8.5)
eGFR: 63 mL/min/{1.73_m2} (ref >59)

## 2021-03-08 LAB — LIPID PANEL
Chol/HDL Ratio: 2.5 ratio (ref 0.0–4.4)
Cholesterol, Total: 197 mg/dL (ref 100–199)
HDL: 80 mg/dL (ref >39)
LDL Chol Calc (NIH): 105 mg/dL — ABNORMAL HIGH (ref 0–99)
Triglycerides: 67 mg/dL (ref 0–149)
VLDL Cholesterol Cal: 12 mg/dL (ref 5–40)

## 2021-03-08 LAB — TSH: TSH: 2.21 u[IU]/mL (ref 0.450–4.500)

## 2021-03-08 LAB — T4, FREE: Free T4: 0.95 ng/dL (ref 0.82–1.77)

## 2021-03-08 NOTE — Progress Notes (Signed)
[facility] Telephone:([phone]) [phone]   [phone]  OFFICE PROGRESS NOTE  Girtha Rm, NP-C [place] [address]  DIAGNOSIS:  1) Microcytic, hypochromic anemia secondary to iron deficiency secondary to menorrhagia. 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  PRIOR THERAPY: Feraheme infusion on as-needed basis  CURRENT THERAPY: None  INTERVAL HISTORY: Jessica [name] 50 y.o. female returns to the clinic today for follow-up visit.  The patient is feeling fine today with no concerning complaints.  She denied having any significant fatigue or weakness.  She denied having any chest pain, shortness of breath, cough or hemoptysis.  She has no bleeding, bruises or ecchymosis.  She has no nausea, vomiting, diarrhea or constipation.  She has no headache or visual changes.  She is not currently on any iron supplements.  She is here today for evaluation with repeat CBC, iron study and ferritin.   MEDICAL HISTORY: Past Medical History:  Diagnosis Date  . Allergy   . Anemia   . Anxiety   . Depression   . Hyperlipidemia   . MGUS (monoclonal gammopathy of unknown significance)     ALLERGIES:  is allergic to sulfonamide derivatives.  MEDICATIONS:  Current Outpatient Medications  Medication Sig Dispense Refill  . atorvastatin (LIPITOR) 40 MG tablet Take 1 tablet (40 mg total) by mouth daily. 90 tablet 2  . Cholecalciferol (D3-1000 PO) Take by mouth.    . ferrous sulfate 325 (65 FE) MG tablet Take 325 mg by mouth daily with breakfast.    . hydrochlorothiazide (HYDRODIURIL) 25 MG tablet Take 1 tablet (25 mg total) by mouth daily. 90 tablet 3  . methocarbamol (ROBAXIN) 500 MG tablet TAKE 1 TABLET BY MOUTH FOUR TIMES A DAY 60 tablet 0  . traZODone (DESYREL) 50 MG tablet Take 0.5-1 tablets (25-50 mg total) by mouth at bedtime as needed for sleep. 90 tablet 1  . venlafaxine XR (EFFEXOR-XR) 75 MG 24 hr  capsule Take 1 capsule (75 mg total) by mouth daily with breakfast. 90 capsule 3   No current facility-administered medications for this visit.    SURGICAL HISTORY:  Past Surgical History:  Procedure Laterality Date  . TUBAL LIGATION      REVIEW OF SYSTEMS:  A comprehensive review of systems was negative.   PHYSICAL EXAMINATION: General appearance: alert, cooperative and no distress Head: Normocephalic, without obvious abnormality, atraumatic Neck: no adenopathy, no JVD, supple, symmetrical, trachea midline and thyroid not enlarged, symmetric, no tenderness/mass/nodules Lymph nodes: Cervical, supraclavicular, and axillary nodes normal. Resp: clear to auscultation bilaterally Back: symmetric, no curvature. ROM normal. No CVA tenderness. Cardio: regular rate and rhythm, S1, S2 normal, no murmur, click, rub or gallop GI: soft, non-tender; bowel sounds normal; no masses,  no organomegaly Extremities: extremities normal, atraumatic, no cyanosis or edema  ECOG PERFORMANCE STATUS: 1 - Symptomatic but completely ambulatory  Blood pressure 138/82, pulse 99, temperature (!) 97.5 F (36.4 C), temperature source Tympanic, resp. rate 18, height _0  (1.702 m), weight 154 lb 12.8 oz (70.2 kg), last menstrual period 02/17/2021, SpO2 100 %.  LABORATORY DATA: Lab Results  Component Value Date   WBC 8.6 03/04/2021   HGB 10.3 (L) 03/04/2021   HCT 34.3 (L) 03/04/2021   MCV 75.4 (L) 03/04/2021   PLT 286 03/04/2021      Chemistry      Component Value Date/Time   NA 141 03/07/2021 1156   K 5.1 03/07/2021 1156  CL 100 03/07/2021 1156   CO2 24 03/07/2021 1156   BUN 13 03/07/2021 1156   CREATININE 1.08 (H) 03/07/2021 1156   CREATININE 1.05 (H) 03/02/2020 1020   CREATININE 0.98 09/10/2017 1333      Component Value Date/Time   CALCIUM 10.2 03/07/2021 1156   ALKPHOS 82 03/07/2021 1156   AST 19 03/07/2021 1156   AST 14 (L) 03/02/2020 1020   ALT 13 03/07/2021 1156   ALT 10 03/02/2020 1020    BILITOT 0.3 03/07/2021 1156   BILITOT 0.3 03/02/2020 1020       RADIOGRAPHIC STUDIES: No results found.  ASSESSMENT AND PLAN: This is a very pleasant 50 years old African-American female presented for evaluation of microcytic hypochromic anemia likely secondary to beta thalassemia minor as well as iron deficiency.   She was previously treated with Feraheme infusion.  The patient is currently on observation and she is feeling fine. The patient has no concerning complaints today. Repeat CBC showed the baseline mild anemia secondary to the potential ischemia.  Her iron study and ferritin are normal to above normal. I recommended for her to continue on observation with repeat CBC, iron study and ferritin in 1 year. She was advised to call immediately if she has any concerning symptoms in the interval. The patient voices understanding of current disease status and treatment options and is in agreement with the current care plan.  All questions were answered. The patient knows to call the clinic with any problems, questions or concerns. We can certainly see the patient much sooner if necessary.  Disclaimer: This note was dictated with voice recognition software. Similar sounding words can inadvertently be transcribed and may not be corrected upon review.

## 2021-03-09 ENCOUNTER — Telehealth: Payer: Self-pay | Admitting: Internal Medicine

## 2021-03-09 NOTE — Telephone Encounter (Signed)
Scheduled per los. Called and left msg. Mailed printout  °

## 2021-03-24 ENCOUNTER — Ambulatory Visit: Payer: BC Managed Care – PPO | Admitting: Family Medicine

## 2021-05-30 ENCOUNTER — Encounter: Payer: Self-pay | Admitting: Internal Medicine

## 2021-07-04 ENCOUNTER — Telehealth: Payer: Self-pay | Admitting: Family Medicine

## 2021-07-04 DIAGNOSIS — G4700 Insomnia, unspecified: Secondary | ICD-10-CM

## 2021-07-04 MED ORDER — TRAZODONE HCL 50 MG PO TABS: 25.0000 mg | ORAL_TABLET | Freq: Every evening | ORAL | 1 refills | Status: DC | PRN

## 2021-07-04 NOTE — Telephone Encounter (Signed)
Pt called and is requesting a refill on her trazodone please send to a new pharmacy  [facility]

## 2021-07-05 ENCOUNTER — Telehealth: Payer: Self-pay

## 2021-07-05 DIAGNOSIS — G4700 Insomnia, unspecified: Secondary | ICD-10-CM

## 2021-07-05 MED ORDER — TRAZODONE HCL 50 MG PO TABS: 25.0000 mg | ORAL_TABLET | Freq: Every evening | ORAL | 1 refills | Status: DC | PRN

## 2021-07-05 NOTE — Telephone Encounter (Signed)
[facility] from [facility] for a refill on the pts. Trazodone pt. Last apt was 03/07/21 and next apt is 09/06/21.

## 2021-07-08 ENCOUNTER — Telehealth: Payer: Self-pay | Admitting: Internal Medicine

## 2021-07-08 NOTE — Telephone Encounter (Signed)
Scheduled appt per 8/26 sch msg. PT aware.

## 2021-07-11 ENCOUNTER — Other Ambulatory Visit: Payer: Self-pay

## 2021-07-11 ENCOUNTER — Inpatient Hospital Stay: Payer: BC Managed Care – PPO | Attending: Internal Medicine | Admitting: Internal Medicine

## 2021-07-11 ENCOUNTER — Inpatient Hospital Stay: Payer: BC Managed Care – PPO

## 2021-07-11 VITALS — BP 136/80 | HR 94 | Temp 96.7°F | Resp 18 | Ht 67.0 in | Wt 158.8 lb

## 2021-07-11 DIAGNOSIS — R42 Dizziness and giddiness: Secondary | ICD-10-CM | POA: Insufficient documentation

## 2021-07-11 DIAGNOSIS — D500 Iron deficiency anemia secondary to blood loss (chronic): Secondary | ICD-10-CM | POA: Insufficient documentation

## 2021-07-11 DIAGNOSIS — D472 Monoclonal gammopathy: Secondary | ICD-10-CM | POA: Diagnosis not present

## 2021-07-11 DIAGNOSIS — R5383 Other fatigue: Secondary | ICD-10-CM | POA: Diagnosis not present

## 2021-07-11 DIAGNOSIS — R519 Headache, unspecified: Secondary | ICD-10-CM | POA: Insufficient documentation

## 2021-07-11 DIAGNOSIS — D563 Thalassemia minor: Secondary | ICD-10-CM | POA: Diagnosis not present

## 2021-07-11 DIAGNOSIS — F419 Anxiety disorder, unspecified: Secondary | ICD-10-CM | POA: Diagnosis not present

## 2021-07-11 DIAGNOSIS — N920 Excessive and frequent menstruation with regular cycle: Secondary | ICD-10-CM | POA: Diagnosis not present

## 2021-07-11 DIAGNOSIS — Z79899 Other long term (current) drug therapy: Secondary | ICD-10-CM | POA: Insufficient documentation

## 2021-07-11 DIAGNOSIS — E785 Hyperlipidemia, unspecified: Secondary | ICD-10-CM | POA: Insufficient documentation

## 2021-07-11 DIAGNOSIS — R0602 Shortness of breath: Secondary | ICD-10-CM | POA: Diagnosis not present

## 2021-07-11 DIAGNOSIS — F32A Depression, unspecified: Secondary | ICD-10-CM | POA: Insufficient documentation

## 2021-07-11 DIAGNOSIS — R531 Weakness: Secondary | ICD-10-CM | POA: Diagnosis not present

## 2021-07-11 LAB — CBC WITH DIFFERENTIAL (CANCER CENTER ONLY)
Abs Immature Granulocytes: 0.01 10*3/uL (ref 0.00–0.07)
Basophils Absolute: 0.1 10*3/uL (ref 0.0–0.1)
Basophils Relative: 1 %
Eosinophils Absolute: 0.1 10*3/uL (ref 0.0–0.5)
Eosinophils Relative: 1 %
HCT: 33.3 % — ABNORMAL LOW (ref 36.0–46.0)
Hemoglobin: 10.3 g/dL — ABNORMAL LOW (ref 12.0–15.0)
Immature Granulocytes: 0 %
Lymphocytes Relative: 32 %
Lymphs Abs: 2.1 10*3/uL (ref 0.7–4.0)
MCH: 22.9 pg — ABNORMAL LOW (ref 26.0–34.0)
MCHC: 30.9 g/dL (ref 30.0–36.0)
MCV: 74 fL — ABNORMAL LOW (ref 80.0–100.0)
Monocytes Absolute: 0.5 10*3/uL (ref 0.1–1.0)
Monocytes Relative: 8 %
Neutro Abs: 3.8 10*3/uL (ref 1.7–7.7)
Neutrophils Relative %: 58 %
Platelet Count: 308 10*3/uL (ref 150–400)
RBC: 4.5 MIL/uL (ref 3.87–5.11)
RDW: 15.6 % — ABNORMAL HIGH (ref 11.5–15.5)
WBC Count: 6.5 10*3/uL (ref 4.0–10.5)
nRBC: 0 % (ref 0.0–0.2)

## 2021-07-11 LAB — IRON AND TIBC
Iron: 54 ug/dL (ref 41–142)
Saturation Ratios: 23 % (ref 21–57)
TIBC: 236 ug/dL (ref 236–444)
UIBC: 183 ug/dL (ref 120–384)

## 2021-07-11 LAB — FERRITIN: Ferritin: 474 ng/mL — ABNORMAL HIGH (ref 11–307)

## 2021-07-11 NOTE — Progress Notes (Signed)
    [facility] [phone]   [phone]  OFFICE PROGRESS NOTE  Henson, Vickie L, NP-C [address] [place] [address]  DIAGNOSIS:  1) Microcytic, hypochromic anemia secondary to iron deficiency secondary to menorrhagia. 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  PRIOR THERAPY: Feraheme infusion on as-needed basis  CURRENT THERAPY: None  INTERVAL HISTORY: Jessica [name] 50 y.o. female returns to the clinic today for follow-up visit.  The patient is complaining of increasing fatigue and weakness as well as dizzy spells that was getting worse last week.  She also has shortness of breath with exertion but no significant chest pain, cough or hemoptysis.  She denied having any fever or chills.  She has no nausea, vomiting, diarrhea or constipation.  She has intermittent headache.  She is here today for repeat blood work for evaluation of her anemia.   MEDICAL HISTORY: Past Medical History:  Diagnosis Date   Allergy    Anemia    Anxiety    Depression    Hyperlipidemia    MGUS (monoclonal gammopathy of unknown significance)     ALLERGIES:  is allergic to sulfonamide derivatives.  MEDICATIONS:  Current Outpatient Medications  Medication Sig Dispense Refill   atorvastatin (LIPITOR) 40 MG tablet Take 1 tablet (40 mg total) by mouth daily. 90 tablet 2   Cholecalciferol (D3-1000 PO) Take by mouth.     ferrous sulfate 325 (65 FE) MG tablet Take 325 mg by mouth daily with breakfast.     hydrochlorothiazide (HYDRODIURIL) 25 MG tablet Take 1 tablet (25 mg total) by mouth daily. 90 tablet 3   methocarbamol (ROBAXIN) 500 MG tablet TAKE 1 TABLET BY MOUTH FOUR TIMES A DAY 60 tablet 0   traZODone (DESYREL) 50 MG tablet Take 0.5-1 tablets (25-50 mg total) by mouth at bedtime as needed for sleep. 90 tablet 1   venlafaxine XR (EFFEXOR-XR) 75 MG 24 hr capsule Take 1 capsule (75 mg total) by mouth daily with  breakfast. 90 capsule 3   No current facility-administered medications for this visit.    SURGICAL HISTORY:  Past Surgical History:  Procedure Laterality Date   TUBAL LIGATION      REVIEW OF SYSTEMS:  A comprehensive review of systems was negative except for: Constitutional: positive for fatigue Respiratory: positive for dyspnea on exertion Neurological: positive for dizziness and headaches   PHYSICAL EXAMINATION: General appearance: alert, cooperative, fatigued, and no distress Head: Normocephalic, without obvious abnormality, atraumatic Neck: no adenopathy, no JVD, supple, symmetrical, trachea midline, and thyroid not enlarged, symmetric, no tenderness/mass/nodules Lymph nodes: Cervical, supraclavicular, and axillary nodes normal. Resp: clear to auscultation bilaterally Back: symmetric, no curvature. ROM normal. No CVA tenderness. Cardio: regular rate and rhythm, S1, S2 normal, no murmur, click, rub or gallop GI: soft, non-tender; bowel sounds normal; no masses,  no organomegaly Extremities: extremities normal, atraumatic, no cyanosis or edema  ECOG PERFORMANCE STATUS: 1 - Symptomatic but completely ambulatory  Blood pressure 136/80, pulse 94, temperature (!) 96.7 F (35.9 C), temperature source Tympanic, resp. rate 18, height 5' 7" (1.702 m), weight 158 lb 12.8 oz (72 kg), SpO2 100 %.  LABORATORY DATA: Lab Results  Component Value Date   WBC 8.6 03/04/2021   HGB 10.3 (L) 03/04/2021   HCT 34.3 (L) 03/04/2021   MCV 75.4 (L) 03/04/2021   PLT 286 03/04/2021      Chemistry      Component Value Date/Time   NA 141 03/07/2021 1156     K 5.1 03/07/2021 1156   CL 100 03/07/2021 1156   CO2 24 03/07/2021 1156   BUN 13 03/07/2021 1156   CREATININE 1.08 (H) 03/07/2021 1156   CREATININE 1.05 (H) 03/02/2020 1020   CREATININE 0.98 09/10/2017 1333      Component Value Date/Time   CALCIUM 10.2 03/07/2021 1156   ALKPHOS 82 03/07/2021 1156   AST 19 03/07/2021 1156   AST 14 (L)  03/02/2020 1020   ALT 13 03/07/2021 1156   ALT 10 03/02/2020 1020   BILITOT 0.3 03/07/2021 1156   BILITOT 0.3 03/02/2020 1020       RADIOGRAPHIC STUDIES: No results found.  ASSESSMENT AND PLAN: This is a very pleasant 50 years old African-American female presented for evaluation of microcytic hypochromic anemia likely secondary to beta thalassemia minor as well as iron deficiency.   She was previously treated with Feraheme infusion.   The patient is currently on observation and treatment only with multivitamins.  She has intolerance to the oral iron tablets. She has been complaining of increasing fatigue and weakness as well as headache and dizzy spells recently. I recommended for the patient to have repeat CBC, iron study and ferritin today.  If she has evidence of iron deficiency, I will arrange for the patient to receive iron infusion with Feraheme or Venofer in the next few weeks. I will see the patient back for follow-up visit in 3 months for evaluation with repeat blood work. She was advised to call immediately if she has any other concerning symptoms in the interval. The patient voices understanding of current disease status and treatment options and is in agreement with the current care plan.  All questions were answered. The patient knows to call the clinic with any problems, questions or concerns. We can certainly see the patient much sooner if necessary.  Disclaimer: This note was dictated with voice recognition software. Similar sounding words can inadvertently be transcribed and may not be corrected upon review.       

## 2021-09-06 ENCOUNTER — Encounter: Payer: BC Managed Care – PPO | Admitting: Family Medicine

## 2021-10-10 ENCOUNTER — Ambulatory Visit: Payer: BC Managed Care – PPO | Admitting: Internal Medicine

## 2021-10-10 ENCOUNTER — Other Ambulatory Visit: Payer: BC Managed Care – PPO

## 2021-10-25 ENCOUNTER — Telehealth: Payer: Self-pay | Admitting: Family Medicine

## 2021-10-25 DIAGNOSIS — E7800 Pure hypercholesterolemia, unspecified: Secondary | ICD-10-CM

## 2021-10-25 MED ORDER — ATORVASTATIN CALCIUM 40 MG PO TABS: 40.0000 mg | ORAL_TABLET | Freq: Every day | ORAL | 0 refills | Status: DC

## 2021-10-25 NOTE — Telephone Encounter (Signed)
Pt called and needs a refill on her liptor please send to the [id number] - , [address] Pt has a cpe march with lyne

## 2021-10-25 NOTE — Telephone Encounter (Signed)
Rx sent 

## 2021-11-21 ENCOUNTER — Other Ambulatory Visit: Payer: Self-pay | Admitting: Physician Assistant

## 2021-11-21 DIAGNOSIS — Z1231 Encounter for screening mammogram for malignant neoplasm of breast: Secondary | ICD-10-CM

## 2021-11-28 LAB — HM DIABETES EYE EXAM

## 2022-01-16 ENCOUNTER — Telehealth: Payer: Self-pay | Admitting: Family Medicine

## 2022-01-16 ENCOUNTER — Encounter: Payer: Self-pay | Admitting: Physician Assistant

## 2022-01-16 ENCOUNTER — Other Ambulatory Visit: Payer: Self-pay | Admitting: Physician Assistant

## 2022-01-16 ENCOUNTER — Other Ambulatory Visit: Payer: Self-pay | Admitting: Family Medicine

## 2022-01-16 DIAGNOSIS — I10 Essential (primary) hypertension: Secondary | ICD-10-CM

## 2022-01-16 DIAGNOSIS — E7800 Pure hypercholesterolemia, unspecified: Secondary | ICD-10-CM

## 2022-01-16 DIAGNOSIS — F4010 Social phobia, unspecified: Secondary | ICD-10-CM

## 2022-01-16 MED ORDER — HYDROCHLOROTHIAZIDE 25 MG PO TABS: 25.0000 mg | ORAL_TABLET | Freq: Every day | ORAL | 0 refills | Status: DC

## 2022-01-16 MED ORDER — VENLAFAXINE HCL ER 75 MG PO CP24: 75.0000 mg | ORAL_CAPSULE | Freq: Every day | ORAL | 0 refills | Status: DC

## 2022-01-16 NOTE — Telephone Encounter (Signed)
Pt called and states she has been trying to hold out until Thursday for her refill of the Venlafaxine for depression.  She states she has been out of this med for over a week and thinks she CANNOT hold on until then. ? ?Can you refill [facility] to [facility] [address] ? ?Please advise pt of decision [phone]  ?

## 2022-01-16 NOTE — Telephone Encounter (Signed)
Advised pt of same. 

## 2022-01-16 NOTE — Telephone Encounter (Signed)
Refill ordered. MyChart message sent to patient to let her know.

## 2022-01-19 ENCOUNTER — Ambulatory Visit: Payer: BC Managed Care – PPO | Admitting: Physician Assistant

## 2022-01-19 ENCOUNTER — Other Ambulatory Visit: Payer: Self-pay

## 2022-01-19 ENCOUNTER — Encounter: Payer: Self-pay | Admitting: Physician Assistant

## 2022-01-19 VITALS — BP 140/80 | HR 74 | Ht 66.0 in | Wt 158.8 lb

## 2022-01-19 DIAGNOSIS — R7301 Impaired fasting glucose: Secondary | ICD-10-CM

## 2022-01-19 DIAGNOSIS — Z1159 Encounter for screening for other viral diseases: Secondary | ICD-10-CM

## 2022-01-19 DIAGNOSIS — F5101 Primary insomnia: Secondary | ICD-10-CM

## 2022-01-19 DIAGNOSIS — I10 Essential (primary) hypertension: Secondary | ICD-10-CM

## 2022-01-19 DIAGNOSIS — Z23 Encounter for immunization: Secondary | ICD-10-CM

## 2022-01-19 DIAGNOSIS — F331 Major depressive disorder, recurrent, moderate: Secondary | ICD-10-CM

## 2022-01-19 DIAGNOSIS — Z83438 Family history of other disorder of lipoprotein metabolism and other lipidemia: Secondary | ICD-10-CM

## 2022-01-19 DIAGNOSIS — Z0000 Encounter for general adult medical examination without abnormal findings: Secondary | ICD-10-CM

## 2022-01-19 DIAGNOSIS — Z8249 Family history of ischemic heart disease and other diseases of the circulatory system: Secondary | ICD-10-CM

## 2022-01-19 DIAGNOSIS — F4010 Social phobia, unspecified: Secondary | ICD-10-CM

## 2022-01-19 DIAGNOSIS — Z1211 Encounter for screening for malignant neoplasm of colon: Secondary | ICD-10-CM

## 2022-01-19 DIAGNOSIS — R196 Halitosis: Secondary | ICD-10-CM

## 2022-01-19 DIAGNOSIS — E782 Mixed hyperlipidemia: Secondary | ICD-10-CM

## 2022-01-19 DIAGNOSIS — F429 Obsessive-compulsive disorder, unspecified: Secondary | ICD-10-CM

## 2022-01-19 MED ORDER — HYDROCHLOROTHIAZIDE 25 MG PO TABS: 25.0000 mg | ORAL_TABLET | Freq: Every day | ORAL | 1 refills | Status: DC

## 2022-01-19 MED ORDER — TRAZODONE HCL 50 MG PO TABS: 25.0000 mg | ORAL_TABLET | Freq: Every evening | ORAL | 1 refills | Status: DC | PRN

## 2022-01-19 MED ORDER — VENLAFAXINE HCL ER 150 MG PO CP24: 150.0000 mg | ORAL_CAPSULE | Freq: Every day | ORAL | 1 refills | Status: DC

## 2022-01-19 MED ORDER — ATORVASTATIN CALCIUM 40 MG PO TABS: 40.0000 mg | ORAL_TABLET | Freq: Every day | ORAL | 1 refills | Status: DC

## 2022-01-19 NOTE — Progress Notes (Signed)
Established Patient Office Visit  Subjective:  Patient ID: Jessica [name], female    DOB: 1971-07-07  Age: 51 y.o. MRN: [id number]  CC:  Chief Complaint  Patient presents with   Annual Exam    Fasting CPE. May need to increase depression meds    HPI Jessica [name] presents for an annual exam. Requests to have her depression medicine increased; states she has been to a therapist in the past and group therapy, but didn't find that helpful; states she is willing to try again with another therapist and Psych MD. Reports a family history of heart disease with her Mother passing away from an MI at age 55; asks if she needs follow up with a Cardiologist; also reports breath odor that her Dentist tells her isn't from her teeth.   Hematology - Curt Bears, MD  Past Medical History:  Diagnosis Date   Allergy    Anemia    Anxiety    Depression    HYPERCHOLESTEROLEMIA 10/07/2007   Qualifier: Diagnosis of  By: Garen Grams     Hyperlipidemia    MGUS (monoclonal gammopathy of unknown significance)     Past Surgical History:  Procedure Laterality Date   TUBAL LIGATION      Family History  Problem Relation Age of Onset   Alcohol abuse Father    Drug abuse Father    Hyperlipidemia Father    Hypertension Father    Alcohol abuse Maternal Uncle    Heart disease Mother    Hyperlipidemia Mother    Hypertension Mother    Heart disease Maternal Grandmother    Hypertension Maternal Grandmother    Hyperlipidemia Maternal Grandmother    Cancer Paternal Grandmother    Diabetes Paternal Grandmother    Hypertension Paternal Grandmother    Cancer Paternal Grandfather    Breast cancer Neg Hx     Social History   Socioeconomic History   Marital status: Married    Spouse name: Not on file   Number of children: 2   Years of education: Not on file   Highest education level: Not on file  Occupational History   Not on file  Tobacco Use   Smoking status: Never   Smokeless  tobacco: Never  Vaping Use   Vaping Use: Never used  Substance and Sexual Activity   Alcohol use: No    Alcohol/week: 0.0 standard drinks   Drug use: No   Sexual activity: Yes    Birth control/protection: Surgical  Other Topics Concern   Not on file  Social History Narrative   Not on file   Social Determinants of Health   Financial Resource Strain: Not on file  Food Insecurity: Not on file  Transportation Needs: Not on file  Physical Activity: Not on file  Stress: Not on file  Social Connections: Not on file  Intimate Partner Violence: Not on file    Outpatient Medications Prior to Visit  Medication Sig Dispense Refill   methocarbamol (ROBAXIN) 500 MG tablet TAKE 1 TABLET BY MOUTH FOUR TIMES A DAY 60 tablet 0   atorvastatin (LIPITOR) 40 MG tablet TAKE 1 TABLET BY MOUTH EVERY DAY 30 tablet 0   hydrochlorothiazide (HYDRODIURIL) 25 MG tablet Take 1 tablet (25 mg total) by mouth daily. 30 tablet 0   traZODone (DESYREL) 50 MG tablet Take 0.5-1 tablets (25-50 mg total) by mouth at bedtime as needed for sleep. 90 tablet 1   venlafaxine XR (EFFEXOR-XR) 75 MG 24 hr capsule Take 1 capsule (75 mg  total) by mouth daily with breakfast. 90 capsule 0   Cholecalciferol (D3-1000 PO) Take by mouth.     ferrous sulfate 325 (65 FE) MG tablet Take 325 mg by mouth daily with breakfast.     No facility-administered medications prior to visit.    Allergies  Allergen Reactions   Sulfonamide Derivatives     REACTION: hives    ROS Review of Systems  Constitutional:  Negative for activity change and fever.  HENT:  Negative for congestion, ear pain and voice change.   Eyes:  Negative for redness.  Respiratory:  Negative for cough.   Cardiovascular:  Negative for chest pain.  Gastrointestinal:  Negative for constipation and diarrhea.  Endocrine: Negative for polyuria.  Genitourinary:  Negative for flank pain.  Musculoskeletal:  Negative for gait problem and neck stiffness.  Skin:  Negative  for color change and rash.  Neurological:  Negative for dizziness.  Hematological:  Negative for adenopathy.  Psychiatric/Behavioral:  Negative for agitation, behavioral problems and confusion. The patient is nervous/anxious.      Objective:    Physical Exam Vitals and nursing note reviewed.  Constitutional:      General: She is not in acute distress.    Appearance: Normal appearance.  HENT:     Head: Normocephalic and atraumatic.     Right Ear: Tympanic membrane, ear canal and external ear normal.     Left Ear: Tympanic membrane, ear canal and external ear normal.  Eyes:     Conjunctiva/sclera: Conjunctivae normal.     Pupils: Pupils are equal, round, and reactive to light.  Neck:     Vascular: No carotid bruit.  Cardiovascular:     Rate and Rhythm: Normal rate and regular rhythm.     Pulses: Normal pulses.     Heart sounds: Normal heart sounds.  Pulmonary:     Effort: Pulmonary effort is normal. No respiratory distress.     Breath sounds: Normal breath sounds. No wheezing.  Abdominal:     General: Bowel sounds are normal.     Palpations: Abdomen is soft.  Musculoskeletal:        General: Normal range of motion.     Cervical back: Normal range of motion and neck supple.     Right lower leg: No edema.     Left lower leg: No edema.  Skin:    General: Skin is warm and dry.     Findings: No rash.  Neurological:     General: No focal deficit present.     Mental Status: She is alert and oriented to person, place, and time.     Gait: Gait normal.  Psychiatric:        Mood and Affect: Mood normal.        Behavior: Behavior normal.    BP 140/80    Pulse 74    Ht '5\' 6"'  (1.676 m)    Wt 158 lb 12.8 oz (72 kg)    SpO2 100%    BMI 25.63 kg/m   Wt Readings from Last 3 Encounters:  01/19/22 158 lb 12.8 oz (72 kg)  07/11/21 158 lb 12.8 oz (72 kg)  03/08/21 154 lb 12.8 oz (70.2 kg)     Health Maintenance Due  Topic Date Due   Hepatitis C Screening  Never done   Zoster  Vaccines- Shingrix (1 of 2) Never done   COLONOSCOPY (Pts 45-54yr Insurance coverage will need to be confirmed)  Never done   MAMMOGRAM  01/18/2022  There are no preventive care reminders to display for this patient.  Lab Results  Component Value Date   TSH 2.210 03/07/2021   Lab Results  Component Value Date   WBC 6.5 07/11/2021   HGB 10.3 (L) 07/11/2021   HCT 33.3 (L) 07/11/2021   MCV 74.0 (L) 07/11/2021   PLT 308 07/11/2021   Lab Results  Component Value Date   NA 141 03/07/2021   K 5.1 03/07/2021   CO2 24 03/07/2021   GLUCOSE 89 03/07/2021   BUN 13 03/07/2021   CREATININE 1.08 (H) 03/07/2021   BILITOT 0.3 03/07/2021   ALKPHOS 82 03/07/2021   AST 19 03/07/2021   ALT 13 03/07/2021   PROT 7.8 03/07/2021   ALBUMIN 4.6 03/07/2021   CALCIUM 10.2 03/07/2021   ANIONGAP 9 03/02/2020   EGFR 63 03/07/2021   Lab Results  Component Value Date   CHOL 197 03/07/2021   Lab Results  Component Value Date   HDL 80 03/07/2021   Lab Results  Component Value Date   LDLCALC 105 (H) 03/07/2021   Lab Results  Component Value Date   TRIG 67 03/07/2021   Lab Results  Component Value Date   CHOLHDL 2.5 03/07/2021   Lab Results  Component Value Date   HGBA1C 5.4 08/25/2020      Assessment & Plan:   Problem List Items Addressed This Visit       Cardiovascular and Mediastinum   Essential hypertension   Relevant Medications   atorvastatin (LIPITOR) 40 MG tablet   hydrochlorothiazide (HYDRODIURIL) 25 MG tablet   Other Relevant Orders   Ambulatory referral to Cardiology     Other   Social anxiety disorder   Relevant Medications   traZODone (DESYREL) 50 MG tablet   venlafaxine XR (EFFEXOR XR) 150 MG 24 hr capsule   Other Relevant Orders   Ambulatory referral to Psychiatry   Major depressive disorder, recurrent episode, moderate (HCC)   Relevant Medications   traZODone (DESYREL) 50 MG tablet   venlafaxine XR (EFFEXOR XR) 150 MG 24 hr capsule   Other Relevant  Orders   Ambulatory referral to Psychiatry   Obsessive compulsive disorder   Relevant Medications   traZODone (DESYREL) 50 MG tablet   venlafaxine XR (EFFEXOR XR) 150 MG 24 hr capsule   Other Relevant Orders   Ambulatory referral to Psychiatry   Mixed hyperlipidemia   Relevant Medications   atorvastatin (LIPITOR) 40 MG tablet   hydrochlorothiazide (HYDRODIURIL) 25 MG tablet   Other Relevant Orders   Ambulatory referral to Cardiology   Primary insomnia   Relevant Medications   traZODone (DESYREL) 50 MG tablet   Family history of heart disease   Relevant Orders   Ambulatory referral to Cardiology   Family history of hyperlipidemia   Relevant Orders   Ambulatory referral to Cardiology   Family history of hypertension   Relevant Orders   Ambulatory referral to Cardiology   Halitosis   Relevant Orders   Ambulatory referral to ENT   Other Visit Diagnoses     Routine medical exam    -  Primary   Relevant Orders   CBC with Differential/Platelet   Comprehensive metabolic panel   Hemoglobin A1c   Lipid panel   Elevated fasting glucose       Relevant Orders   Hemoglobin A1c   Need for shingles vaccine       Relevant Orders   Varicella-zoster vaccine IM   Screening for colon cancer  Relevant Orders   Ambulatory referral to Gastroenterology   Need for hepatitis C screening test       Relevant Orders   Hepatitis C antibody       Meds ordered this encounter  Medications   atorvastatin (LIPITOR) 40 MG tablet    Sig: Take 1 tablet (40 mg total) by mouth daily.    Dispense:  90 tablet    Refill:  1    Order Specific Question:   Supervising Provider    Answer:   Denita Lung [[id number]]   hydrochlorothiazide (HYDRODIURIL) 25 MG tablet    Sig: Take 1 tablet (25 mg total) by mouth daily.    Dispense:  90 tablet    Refill:  1    Order Specific Question:   Supervising Provider    Answer:   Denita Lung [[id number]]   traZODone (DESYREL) 50 MG tablet    Sig: Take 0.5-1  tablets (25-50 mg total) by mouth at bedtime as needed for sleep.    Dispense:  90 tablet    Refill:  1    Order Specific Question:   Supervising Provider    Answer:   Denita Lung [[id number]]   venlafaxine XR (EFFEXOR XR) 150 MG 24 hr capsule    Sig: Take 1 capsule (150 mg total) by mouth daily with breakfast.    Dispense:  90 capsule    Refill:  1    Order Specific Question:   Supervising Provider    Answer:   Denita Lung [[id number]]    Follow-up: Return in about 6 months (around 07/22/2022) for Return for Follow Up Exam.   Patient states she will call to find a new therapist as well.  Irene Pap, PA-C

## 2022-01-19 NOTE — Patient Instructions (Addendum)
You will get a call to schedule an appointment with a Psychatrist, Gastroenterologist, Cardiology, and ENT.  Preventative Care for Adults - Female      MAINTAIN REGULAR HEALTH EXAMS: A routine yearly physical is a good way to check in with your primary care provider about your health and preventive screening. It is also an opportunity to share updates about your health and any concerns you have, and receive a thorough all-over exam.  Most health insurance companies pay for at least some preventative services.  Check with your health plan for specific coverages.  WHAT PREVENTATIVE SERVICES DO WOMEN NEED? Adult women should have their weight and blood pressure checked regularly.  Women age 7 and older should have their cholesterol levels checked regularly. Women should be screened for cervical cancer with a Pap smear and pelvic exam beginning at either age 60, or 3 years after they become sexually activity.   Breast cancer screening generally begins at age 92 with a mammogram and breast exam by your primary care provider.   Beginning at age 63 and continuing to age 67, women should be screened for colorectal cancer.  Certain people may need continued testing until age 9. Updating vaccinations is part of preventative care.  Vaccinations help protect against diseases such as the flu. Osteoporosis is a disease in which the bones lose minerals and strength as we age. Women ages 59 and over should discuss this with their caregivers, as should women after menopause who have other risk factors. Lab tests are generally done as part of preventative care to screen for anemia and blood disorders, to screen for problems with the kidneys and liver, to screen for bladder problems, to check blood sugar, and to check your cholesterol level. Preventative services generally include counseling about diet, exercise, avoiding tobacco, drugs, excessive alcohol consumption, and sexually transmitted infections.     GENERAL RECOMMENDATIONS FOR GOOD HEALTH:  Healthy diet: Eat a variety of foods, including fruit, vegetables, animal or vegetable protein, such as meat, fish, chicken, and eggs, or beans, lentils, tofu, and grains, such as rice. Drink plenty of water daily (60 - 80 ounces or 8 - 10 glasses a day) Decrease saturated fat in the diet, avoid lots of red meat, processed foods, sweets, fast foods, and fried foods. For high cholesterol - Increase fiber intake (Benefiber or Metamucil, Cherrios,  oatmeal, beans, nuts, fruits and vegetables), limit saturated fats (in fried foods, red meat), can add OTC fish oil supplement, eat fish with Omega-3 fatty acids like salmon and tuna, exercise for 30 minutes 3 - 5 times a week, drink 8 - 10 glasses of water a day  Exercise: Aerobic exercise helps maintain good heart health. At least 30-40 minutes of moderate-intensity exercise is recommended. For example, a brisk walk that increases your heart rate and breathing. This should be done on most days of the week.  Find a type of exercise or a variety of exercises that you enjoy so that it becomes a part of your daily life.  Examples are running, walking, swimming, water aerobics, and biking.  For motivation and support, explore group exercise such as aerobic class, spin class, Zumba, Yoga,or  martial arts, etc.   Set exercise goals for yourself, such as a certain weight goal, walk or run in a race such as a 5k walk/run.  Speak to your primary care provider about exercise goals.  Disease prevention: If you smoke or chew tobacco, find out from your caregiver how to quit. It can literally  save your life, no matter how long you have been a tobacco user. If you do not use tobacco, never begin.  Maintain a healthy diet and normal weight. Increased weight leads to problems with blood pressure and diabetes.  The Body Mass Index or BMI is a way of measuring how much of your body is fat. Having a BMI above 27 increases the risk  of heart disease, diabetes, hypertension, stroke and other problems related to obesity. Your caregiver can help determine your BMI and based on it develop an exercise and dietary program to help you achieve or maintain this important measurement at a healthful level. High blood pressure causes heart and blood vessel problems.  Persistent high blood pressure should be treated with medicine if weight loss and exercise do not work.  Fat and cholesterol leaves deposits in your arteries that can block them. This causes heart disease and vessel disease elsewhere in your body.  If your cholesterol is found to be high, or if you have heart disease or certain other medical conditions, then you may need to have your cholesterol monitored frequently and be treated with medication.  Ask if you should have a cardiac stress test if your history suggests this. A stress test is a test done on a treadmill that looks for heart disease. This test can find disease prior to there being a problem. Menopause can be associated with physical symptoms and risks. Hormone replacement therapy is available to decrease these. You should talk to your caregiver about whether starting or continuing to take hormones is right for you.  Osteoporosis is a disease in which the bones lose minerals and strength as we age. This can result in serious bone fractures. Risk of osteoporosis can be identified using a bone density scan. Women ages 106 and over should discuss this with their caregivers, as should women after menopause who have other risk factors. Ask your caregiver whether you should be taking a calcium supplement and Vitamin D, to reduce the rate of osteoporosis.  Avoid drinking alcohol in excess (more than two drinks per day).  Avoid use of street drugs. Do not share needles with anyone. Ask for professional help if you need assistance or instructions on stopping the use of alcohol, cigarettes, and/or drugs. Brush your teeth twice a day  with fluoride toothpaste, and floss once a day. Good oral hygiene prevents tooth decay and gum disease. The problems can be painful, unattractive, and can cause other health problems. Visit your dentist for a routine oral and dental check up and preventive care every 6-12 months.  Look at your skin regularly.  Use a mirror to look at your back. Notify your caregivers of changes in moles, especially if there are changes in shapes, colors, a size larger than a pencil eraser, an irregular border, or development of new moles.  Safety: Use seatbelts 100% of the time, whether driving or as a passenger.  Use safety devices such as hearing protection if you work in environments with loud noise or significant background noise.  Use safety glasses when doing any work that could send debris in to the eyes.  Use a helmet if you ride a bike or motorcycle.  Use appropriate safety gear for contact sports.  Talk to your caregiver about gun safety. Use sunscreen with a SPF (or skin protection factor) of 15 or greater.  Lighter skinned people are at a greater risk of skin cancer. Dont forget to also wear sunglasses in order to protect your  eyes from too much damaging sunlight. Damaging sunlight can accelerate cataract formation.  Practice safe sex. Use condoms. Condoms are used for birth control and to help reduce the spread of sexually transmitted infections (or STIs).  Some of the STIs are gonorrhea (the clap), chlamydia, syphilis, trichomonas, herpes, HPV (human papilloma virus) and HIV (human immunodeficiency virus) which causes AIDS. The herpes, HIV and HPV are viral illnesses that have no cure. These can result in disability, cancer and death.  Keep carbon monoxide and smoke detectors in your home functioning at all times. Change the batteries every 6 months or use a model that plugs into the wall.   Vaccinations: Stay up to date with your tetanus shots and other required immunizations. You should have a booster for  tetanus every 10 years. Be sure to get your flu shot every year, since 5%-20% of the [place] population comes down with the flu. The flu vaccine changes each year, so being vaccinated once is not enough. Get your shot in the fall, before the flu season peaks.   Other vaccines to consider: Human Papilloma Virus or HPV causes cancer of the cervix, and other infections that can be transmitted from person to person. There is a vaccine for HPV, and females should get immunized between the ages of 24 and 44. It requires a series of 3 shots.  Pneumococcal vaccine to protect against certain types of pneumonia.  This is normally recommended for adults age 8 or older.  However, adults younger than 51 years old with certain underlying conditions such as diabetes, heart or lung disease should also receive the vaccine. Shingles vaccine to protect against Varicella Zoster if you are older than age 52, or younger than 51 years old with certain underlying illness. Hepatitis A vaccine to protect against a form of infection of the liver by a virus acquired from food. Hepatitis B vaccine to protect against a form of infection of the liver by a virus acquired from blood or body fluids, particularly if you work in health care. If you plan to travel internationally, check with your local health department for specific vaccination recommendations.  Cancer Screening: Breast cancer screening is essential to preventive care for women. All women age 44 and older should perform a breast self-exam every month. At age 13 and older, women should have their caregiver complete a breast exam each year. Women at ages 75 and older should have a mammogram (x-ray film) of the breasts. Your caregiver can discuss how often you need mammograms.   Cervical cancer screening includes taking a Pap smear (sample of cells examined under a microscope) from the cervix (end of the uterus). It also includes testing for HPV (Human Papilloma Virus, which can  cause cervical cancer). Screening and a pelvic exam should begin at age 83, or 3 years after a woman becomes sexually active. Screening should occur every year, with a Pap smear but no HPV testing, up to age 45. After age 72, you should have a Pap smear every 3 years with HPV testing, if no HPV was found previously.  Most routine colon cancer screening begins at the age of 18. On a yearly basis, doctors may provide special easy to use take-home tests to check for hidden blood in the stool. Sigmoidoscopy or colonoscopy can detect the earliest forms of colon cancer and is life saving. These tests use a small camera at the end of a tube to directly examine the colon. Speak to your caregiver about this at age  50, when routine screening begins (and is repeated every 5 years unless early forms of pre-cancerous polyps or small growths are found).

## 2022-01-20 LAB — COMPREHENSIVE METABOLIC PANEL
ALT: 34 IU/L — ABNORMAL HIGH (ref 0–32)
AST: 18 IU/L (ref 0–40)
Albumin/Globulin Ratio: 1.5 (ref 1.2–2.2)
Albumin: 4.5 g/dL (ref 3.8–4.9)
Alkaline Phosphatase: 94 IU/L (ref 44–121)
BUN/Creatinine Ratio: 7 — ABNORMAL LOW (ref 9–23)
BUN: 7 mg/dL (ref 6–24)
Bilirubin Total: 0.5 mg/dL (ref 0.0–1.2)
CO2: 26 mmol/L (ref 20–29)
Calcium: 9.7 mg/dL (ref 8.7–10.2)
Chloride: 102 mmol/L (ref 96–106)
Creatinine, Ser: 1.06 mg/dL — ABNORMAL HIGH (ref 0.57–1.00)
Globulin, Total: 3.1 g/dL (ref 1.5–4.5)
Glucose: 96 mg/dL (ref 70–99)
Potassium: 4 mmol/L (ref 3.5–5.2)
Sodium: 141 mmol/L (ref 134–144)
Total Protein: 7.6 g/dL (ref 6.0–8.5)
eGFR: 64 mL/min/{1.73_m2} (ref >59)

## 2022-01-20 LAB — CBC WITH DIFFERENTIAL/PLATELET
Basophils Absolute: 0.1 10*3/uL (ref 0.0–0.2)
Basos: 1 %
EOS (ABSOLUTE): 0.1 10*3/uL (ref 0.0–0.4)
Eos: 1 %
Hematocrit: 32.8 % — ABNORMAL LOW (ref 34.0–46.6)
Hemoglobin: 10.3 g/dL — ABNORMAL LOW (ref 11.1–15.9)
Immature Grans (Abs): 0 10*3/uL (ref 0.0–0.1)
Immature Granulocytes: 0 %
Lymphocytes Absolute: 1.7 10*3/uL (ref 0.7–3.1)
Lymphs: 29 %
MCH: 22.6 pg — ABNORMAL LOW (ref 26.6–33.0)
MCHC: 31.4 g/dL — ABNORMAL LOW (ref 31.5–35.7)
MCV: 72 fL — ABNORMAL LOW (ref 79–97)
Monocytes Absolute: 0.5 10*3/uL (ref 0.1–0.9)
Monocytes: 8 %
Neutrophils Absolute: 3.5 10*3/uL (ref 1.4–7.0)
Neutrophils: 61 %
Platelets: 323 10*3/uL (ref 150–450)
RBC: 4.56 x10E6/uL (ref 3.77–5.28)
RDW: 15.9 % — ABNORMAL HIGH (ref 11.7–15.4)
WBC: 5.8 10*3/uL (ref 3.4–10.8)

## 2022-01-20 LAB — LIPID PANEL
Chol/HDL Ratio: 2.5 ratio (ref 0.0–4.4)
Cholesterol, Total: 193 mg/dL (ref 100–199)
HDL: 77 mg/dL (ref >39)
LDL Chol Calc (NIH): 100 mg/dL — ABNORMAL HIGH (ref 0–99)
Triglycerides: 92 mg/dL (ref 0–149)
VLDL Cholesterol Cal: 16 mg/dL (ref 5–40)

## 2022-01-20 LAB — HEPATITIS C ANTIBODY: Hep C Virus Ab: NONREACTIVE

## 2022-01-20 LAB — HEMOGLOBIN A1C
Est. average glucose Bld gHb Est-mCnc: 111 mg/dL
Hgb A1c MFr Bld: 5.5 % (ref 4.8–5.6)

## 2022-01-23 ENCOUNTER — Encounter: Payer: Self-pay | Admitting: Gastroenterology

## 2022-01-23 ENCOUNTER — Ambulatory Visit
Admission: RE | Admit: 2022-01-23 | Discharge: 2022-01-23 | Disposition: A | Discharge status: Home / Self Care | Payer: BC Managed Care – PPO | Source: Ambulatory Visit | Attending: Physician Assistant | Admitting: Physician Assistant

## 2022-01-23 DIAGNOSIS — Z1231 Encounter for screening mammogram for malignant neoplasm of breast: Secondary | ICD-10-CM

## 2022-02-06 ENCOUNTER — Other Ambulatory Visit: Payer: Self-pay

## 2022-02-06 ENCOUNTER — Ambulatory Visit: Payer: BC Managed Care – PPO | Admitting: Internal Medicine

## 2022-02-06 ENCOUNTER — Encounter: Payer: Self-pay | Admitting: Internal Medicine

## 2022-02-06 VITALS — BP 122/68 | HR 88 | Ht 67.0 in | Wt 154.0 lb

## 2022-02-06 DIAGNOSIS — Z7189 Other specified counseling: Secondary | ICD-10-CM | POA: Diagnosis not present

## 2022-02-06 NOTE — Progress Notes (Signed)
?Cardiology Office Note:   ? ?Date:  02/06/2022  ? ?ID:  Jessica [name], DOB 05-23-1971, MRN [id number] ? ?PCP:  Irene Pap, PA-C ?  ?[facility] Providers ?Cardiologist:  Janina Mayo, MD    ? ?Referring MD: Irene Pap, PA-C  ? ?No chief complaint on file. ?Premature family hx CAD ? ?History of Present Illness:   ? ?Jessica [name] is a 51 y.o. female with a hx of HLD, MGUS who was referred by PA Francis Gaines for family hx of premature CAD ? ?She notes her mother , uncle and grandmother. Her mother passed at 58, ?LAD disease. Her sister is healthy. She has hx of panic attacks. She notes some pressure that goes away. She can get tired.  She denies DOE exertion or cp with activity. ? ?She saw a cardiologist a couple years ago. She notes that she was told her echo was normal in 2019. ? ?She is a non smoker. She has hypertension. Managed with HCTz.  ? ?Past Medical History:  ?Diagnosis Date  ? Allergy   ? Anemia   ? Anxiety   ? Depression   ? HYPERCHOLESTEROLEMIA 10/07/2007  ? Qualifier: Diagnosis of  By: Garen Grams    ? Hyperlipidemia   ? MGUS (monoclonal gammopathy of unknown significance)   ? ? ?Past Surgical History:  ?Procedure Laterality Date  ? TUBAL LIGATION    ? ? ?Current Medications: ?Current Meds  ?Medication Sig  ? atorvastatin (LIPITOR) 40 MG tablet Take 1 tablet (40 mg total) by mouth daily.  ? hydrochlorothiazide (HYDRODIURIL) 25 MG tablet Take 1 tablet (25 mg total) by mouth daily.  ? methocarbamol (ROBAXIN) 500 MG tablet TAKE 1 TABLET BY MOUTH FOUR TIMES A DAY  ? traZODone (DESYREL) 50 MG tablet Take 0.5-1 tablets (25-50 mg total) by mouth at bedtime as needed for sleep.  ? venlafaxine XR (EFFEXOR XR) 150 MG 24 hr capsule Take 1 capsule (150 mg total) by mouth daily with breakfast.  ?  ? ?Allergies:   Sulfonamide derivatives  ? ?Social History  ? ?Socioeconomic History  ? Marital status: Married  ?  Spouse name: Not on file  ? Number of children: 2  ? Years of education: Not  on file  ? Highest education level: Not on file  ?Occupational History  ? Not on file  ?Tobacco Use  ? Smoking status: Never  ? Smokeless tobacco: Never  ?Vaping Use  ? Vaping Use: Never used  ?Substance and Sexual Activity  ? Alcohol use: No  ?  Alcohol/week: 0.0 standard drinks  ? Drug use: No  ? Sexual activity: Yes  ?  Birth control/protection: Surgical  ?Other Topics Concern  ? Not on file  ?Social History Narrative  ? Not on file  ? ?Social Determinants of Health  ? ?Financial Resource Strain: Not on file  ?Food Insecurity: Not on file  ?Transportation Needs: Not on file  ?Physical Activity: Not on file  ?Stress: Not on file  ?Social Connections: Not on file  ?  ? ?Family History: ?The patient's family history includes Alcohol abuse in her father and maternal uncle; Cancer in her paternal grandfather and paternal grandmother; Diabetes in her paternal grandmother; Drug abuse in her father; Heart disease in her maternal grandmother and mother; Hyperlipidemia in her father, maternal grandmother, and mother; Hypertension in her father, maternal grandmother, mother, and paternal grandmother. There is no history of Breast cancer. ? ?ROS:   ?Please see the history of present illness.    ?  All other systems reviewed and are negative. ? ?EKGs/Labs/Other Studies Reviewed:   ? ?The following studies were reviewed today: ? ? ?EKG:  EKG is  ordered today.  The ekg ordered today demonstrates  ? ?02/06/2022-NSR, LAD ? ?Recent Labs: ?03/07/2021: TSH 2.210 ?01/19/2022: ALT 34; BUN 7; Creatinine, Ser 1.06; Hemoglobin 10.3; Platelets 323; Potassium 4.0; Sodium 141  ?Recent Lipid Panel ?   ?Component Value Date/Time  ? CHOL 193 01/19/2022 0952  ? TRIG 92 01/19/2022 0952  ? HDL 77 01/19/2022 0952  ? CHOLHDL 2.5 01/19/2022 0952  ? CHOLHDL 3.8 06/16/2019 1112  ? VLDL NOT CALC 08/31/2016 1627  ? VLDL 13 08/31/2016 1627  ? [place] 100 (H) 01/19/2022 6222  ? LDLCALC 175 (H) 06/16/2019 1112  ? ? ? ?Risk Assessment/Calculations:   ?  ? ?The  10-year ASCVD risk score (Arnett DK, et al., 2019) is: 1.9% ?  Values used to calculate the score: ?    Age: 43 years ?    Sex: Female ?    Is Non-Hispanic African American: Yes ?    Diabetic: No ?    Tobacco smoker: No ?    Systolic Blood Pressure: 979 mmHg ?    Is BP treated: Yes ?    HDL Cholesterol: 77 mg/dL ?    Total Cholesterol: 193 mg/dL ? ? ?    ? ?Physical Exam:   ? ?VS:  BP 122/68   Pulse 88   Ht '5\' 7"'$  (1.702 m)   Wt 154 lb (69.9 kg)   LMP 01/09/2022   SpO2 99%   BMI 24.12 kg/m?    ? ?Wt Readings from Last 3 Encounters:  ?02/06/22 154 lb (69.9 kg)  ?01/19/22 158 lb 12.8 oz (72 kg)  ?07/11/21 158 lb 12.8 oz (72 kg)  ?  ? ?GEN:  Well nourished, well developed in no acute distress ?HEENT: Normal ?NECK: No JVD; No carotid bruits ?LYMPHATICS: No lymphadenopathy ?CARDIAC:  RRR, no murmurs, rubs, gallops ?RESPIRATORY:  Clear to auscultation without rales, wheezing or rhonchi  ?ABDOMEN: Soft, non-tender, non-distended ?MUSCULOSKELETAL:  No edema; No deformity  ?SKIN: Warm and dry ?NEUROLOGIC:  Alert and oriented x 3 ?PSYCHIATRIC:  Normal affect  ? ?ASSESSMENT:   ? ?CVD risk mitigation:  Recommend to continue with lifestyle modification and CVD risk mitigation (yearly A1c (goal < 7), lipid monitoring for ASCVD assessment); Her ASCVD risk is low and statin is not indicated. We discussed life's essential 8 recommendations from the American Heart Association. Otherwise she does not have an indication to continue to follow with cardiology at this time.  ? ?HTN: well controlled on Hctz. ? ? ?PLAN:   ? ?In order of problems listed above: ? ?Can stop statin ?Follow up as needed ? ?   ? ?   ?Medication Adjustments/Labs and Tests Ordered: ?Current medicines are reviewed at length with the patient today.  Concerns regarding medicines are outlined above.  ?Orders Placed This Encounter  ?Procedures  ? EKG 12-Lead  ? ?No orders of the defined types were placed in this encounter. ? ? ?Patient Instructions  ?Medication  Instructions:  ?No Changes In Medications at this time.  ?*If you need a refill on your cardiac medications before your next appointment, please call your pharmacy* ? ?Follow-Up: ?At [facility], you and your health needs are our priority.  As part of our continuing mission to provide you with exceptional heart care, we have created designated Provider Care Teams.  These Care Teams include your primary Cardiologist (  physician) and Advanced Practice Providers (APPs -  Physician Assistants and Nurse Practitioners) who all work together to provide you with the care you need, when you need it. ? ?Your next appointment:   ?AS NEEDED  ? ?The format for your next appointment:   ?In Person ? ?Provider:   ?Janina Mayo, MD    ? ?Signed, ?Janina Mayo, MD  ?02/06/2022 8:47 AM    ?[place] ?

## 2022-02-06 NOTE — Patient Instructions (Signed)

## 2022-02-24 ENCOUNTER — Ambulatory Visit (AMBULATORY_SURGERY_CENTER): Payer: BC Managed Care – PPO

## 2022-02-24 VITALS — Ht 66.0 in | Wt 152.8 lb

## 2022-02-24 DIAGNOSIS — Z1211 Encounter for screening for malignant neoplasm of colon: Secondary | ICD-10-CM

## 2022-02-24 MED ORDER — ONDANSETRON HCL 4 MG PO TABS: 4.0000 mg | ORAL_TABLET | ORAL | 0 refills | Status: DC

## 2022-02-24 MED ORDER — NA SULFATE-K SULFATE-MG SULF 17.5-3.13-1.6 GM/177ML PO SOLN
1.0000 | Freq: Once | ORAL | 0 refills | Status: AC
Start: 2022-02-24 — End: 2022-02-24

## 2022-02-24 NOTE — Progress Notes (Signed)
No egg or soy allergy known to patient  ?No issues known to pt with past sedation with any surgeries or procedures ?Patient denies ever being told they had issues or difficulty with intubation  ?No FH of Malignant Hyperthermia ?Pt is not on diet pills ?Pt is not on  home 02  ?Pt is not on blood thinners  ?Pt denies issues with constipation  ?No A fib or A flutter ? ?  NO PA's for preps discussed with pt In PV today  ?Discussed with pt there will be an out-of-pocket cost for prep and that varies from $0 to 70 +  dollars - pt verbalized understanding  ?Pt instructed to use Singlecare.com or GoodRx for a price reduction on prep  ? ?PV completed over the phone. Pt verified name, DOB, address and insurance during PV today.  ?Pt mailed instruction packet with copy of consent form to read and not return, and instructions.  ?Pt encouraged to call with questions or issues.  ?If pt has My chart, procedure instructions also sent via My Chart  ? ?

## 2022-03-07 ENCOUNTER — Other Ambulatory Visit: Payer: Self-pay

## 2022-03-07 DIAGNOSIS — D500 Iron deficiency anemia secondary to blood loss (chronic): Secondary | ICD-10-CM

## 2022-03-08 ENCOUNTER — Encounter: Payer: Self-pay | Admitting: Gastroenterology

## 2022-03-09 ENCOUNTER — Inpatient Hospital Stay: Payer: BC Managed Care – PPO | Attending: Internal Medicine | Admitting: Internal Medicine

## 2022-03-09 ENCOUNTER — Other Ambulatory Visit: Payer: Self-pay

## 2022-03-09 ENCOUNTER — Inpatient Hospital Stay: Payer: BC Managed Care – PPO

## 2022-03-09 VITALS — BP 133/82 | HR 96 | Temp 98.0°F | Wt 159.4 lb

## 2022-03-09 DIAGNOSIS — D500 Iron deficiency anemia secondary to blood loss (chronic): Secondary | ICD-10-CM | POA: Insufficient documentation

## 2022-03-09 DIAGNOSIS — Z79899 Other long term (current) drug therapy: Secondary | ICD-10-CM | POA: Diagnosis not present

## 2022-03-09 DIAGNOSIS — N920 Excessive and frequent menstruation with regular cycle: Secondary | ICD-10-CM | POA: Diagnosis not present

## 2022-03-09 DIAGNOSIS — D563 Thalassemia minor: Secondary | ICD-10-CM | POA: Insufficient documentation

## 2022-03-09 DIAGNOSIS — D509 Iron deficiency anemia, unspecified: Secondary | ICD-10-CM

## 2022-03-09 DIAGNOSIS — D473 Essential (hemorrhagic) thrombocythemia: Secondary | ICD-10-CM | POA: Insufficient documentation

## 2022-03-09 LAB — CBC WITH DIFFERENTIAL (CANCER CENTER ONLY)
Abs Immature Granulocytes: 0.02 10*3/uL (ref 0.00–0.07)
Basophils Absolute: 0.1 10*3/uL (ref 0.0–0.1)
Basophils Relative: 1 %
Eosinophils Absolute: 0.1 10*3/uL (ref 0.0–0.5)
Eosinophils Relative: 1 %
HCT: 34.4 % — ABNORMAL LOW (ref 36.0–46.0)
Hemoglobin: 10.5 g/dL — ABNORMAL LOW (ref 12.0–15.0)
Immature Granulocytes: 0 %
Lymphocytes Relative: 38 %
Lymphs Abs: 2.5 10*3/uL (ref 0.7–4.0)
MCH: 22.5 pg — ABNORMAL LOW (ref 26.0–34.0)
MCHC: 30.5 g/dL (ref 30.0–36.0)
MCV: 73.8 fL — ABNORMAL LOW (ref 80.0–100.0)
Monocytes Absolute: 0.4 10*3/uL (ref 0.1–1.0)
Monocytes Relative: 7 %
Neutro Abs: 3.5 10*3/uL (ref 1.7–7.7)
Neutrophils Relative %: 53 %
Platelet Count: 309 10*3/uL (ref 150–400)
RBC: 4.66 MIL/uL (ref 3.87–5.11)
RDW: 15 % (ref 11.5–15.5)
WBC Count: 6.7 10*3/uL (ref 4.0–10.5)
nRBC: 0 % (ref 0.0–0.2)

## 2022-03-09 LAB — IRON AND IRON BINDING CAPACITY (CC-WL,HP ONLY)
Iron: 75 ug/dL (ref 28–170)
Saturation Ratios: 28 % (ref 10.4–31.8)
TIBC: 266 ug/dL (ref 250–450)
UIBC: 191 ug/dL (ref 148–442)

## 2022-03-09 LAB — FERRITIN: Ferritin: 315 ng/mL — ABNORMAL HIGH (ref 11–307)

## 2022-03-09 NOTE — Progress Notes (Signed)
?    [place] ?[phone]   [phone] ? ?OFFICE PROGRESS NOTE ? ?Jessica [name], Jessica [name] ?[address] ?[place] [id number] ? ?DIAGNOSIS:  ?1) Microcytic, hypochromic anemia secondary to iron deficiency secondary to menorrhagia. ?2) beta thalassemia minor ?3) monoclonal gammopathy suspicious for multiple myeloma. ? ?PRIOR THERAPY: Feraheme infusion on as-needed basis ? ?CURRENT THERAPY: None ? ?INTERVAL HISTORY: ?Jessica [name] 51 y.o. female returns to the clinic today for follow-up visit.  The patient was last seen in August 2022.  She has been doing fine with no concerning complaints except for mild fatigue.  She denied having any current dizzy spells or shortness of breath.  She has no craving for ice or starch.  She denied having any chest pain, cough or hemoptysis.  She has no nausea, vomiting, diarrhea or constipation.  She has no bleeding, bruises or ecchymosis.  She is here today for evaluation and repeat blood work. ? ? ?MEDICAL HISTORY: ?Past Medical History:  ?Diagnosis Date  ? Allergy   ? seasonal  ? Anemia   ? Anxiety   ? Depression   ? HYPERCHOLESTEROLEMIA 10/07/2007  ? Qualifier: Diagnosis of  By: Garen Grams    ? Hyperlipidemia   ? MGUS (monoclonal gammopathy of unknown significance)   ? ? ?ALLERGIES:  is allergic to sulfonamide derivatives. ? ?MEDICATIONS:  ?Current Outpatient Medications  ?Medication Sig Dispense Refill  ? atorvastatin (LIPITOR) 40 MG tablet Take 1 tablet (40 mg total) by mouth daily. 90 tablet 1  ? hydrochlorothiazide (HYDRODIURIL) 25 MG tablet Take 1 tablet (25 mg total) by mouth daily. 90 tablet 1  ? methocarbamol (ROBAXIN) 500 MG tablet TAKE 1 TABLET BY MOUTH FOUR TIMES A DAY (Patient taking differently: every 6 (six) hours as needed.) 60 tablet 0  ? Omega-3 Fatty Acids (OMEGA-3 EPA FISH OIL PO) Take 1,000 mg by mouth in the morning and at bedtime.    ? ondansetron (ZOFRAN) 4 MG tablet Take 1 tablet (4 mg total) by  mouth as directed. Take one Zofran 4 mg tablet 30-60 minutes before each prep dose 2 tablet 0  ? traZODone (DESYREL) 50 MG tablet Take 0.5-1 tablets (25-50 mg total) by mouth at bedtime as needed for sleep. 90 tablet 1  ? venlafaxine XR (EFFEXOR XR) 150 MG 24 hr capsule Take 1 capsule (150 mg total) by mouth daily with breakfast. 90 capsule 1  ? venlafaxine XR (EFFEXOR-XR) 75 MG 24 hr capsule Take 75 mg by mouth every morning.    ? ?No current facility-administered medications for this visit.  ? ? ?SURGICAL HISTORY:  ?Past Surgical History:  ?Procedure Laterality Date  ? TUBAL LIGATION    ? ? ?REVIEW OF SYSTEMS:  A comprehensive review of systems was negative except for: Constitutional: positive for fatigue  ? ?PHYSICAL EXAMINATION: General appearance: alert, cooperative, fatigued, and no distress ?Head: Normocephalic, without obvious abnormality, atraumatic ?Neck: no adenopathy, no JVD, supple, symmetrical, trachea midline, and thyroid not enlarged, symmetric, no tenderness/mass/nodules ?Lymph nodes: Cervical, supraclavicular, and axillary nodes normal. ?Resp: clear to auscultation bilaterally ?Back: symmetric, no curvature. ROM normal. No CVA tenderness. ?Cardio: regular rate and rhythm, S1, S2 normal, no murmur, click, rub or gallop ?GI: soft, non-tender; bowel sounds normal; no masses,  no organomegaly ?Extremities: extremities normal, atraumatic, no cyanosis or edema ? ?ECOG PERFORMANCE STATUS: 1 - Symptomatic but completely ambulatory ? ?Blood pressure 133/82, pulse 96, temperature 98 ?F (36.7 ?C), temperature source Tympanic, weight 159 lb 6.4 oz (72.3 kg),  SpO2 100 %. ? ?LABORATORY DATA: ?Lab Results  ?Component Value Date  ? WBC 5.8 01/19/2022  ? HGB 10.3 (L) 01/19/2022  ? HCT 32.8 (L) 01/19/2022  ? MCV 72 (L) 01/19/2022  ? PLT 323 01/19/2022  ? ? ?  Chemistry   ?   ?Component Value Date/Time  ? NA 141 01/19/2022 0952  ? K 4.0 01/19/2022 0952  ? CL 102 01/19/2022 0952  ? CO2 26 01/19/2022 0952  ? BUN 7  01/19/2022 0952  ? CREATININE 1.06 (H) 01/19/2022 3081  ? CREATININE 1.05 (H) 03/02/2020 1020  ? CREATININE 0.98 09/10/2017 1333  ?    ?Component Value Date/Time  ? CALCIUM 9.7 01/19/2022 0952  ? ALKPHOS 94 01/19/2022 0952  ? AST 18 01/19/2022 0952  ? AST 14 (L) 03/02/2020 1020  ? ALT 34 (H) 01/19/2022 6838  ? ALT 10 03/02/2020 1020  ? BILITOT 0.5 01/19/2022 0952  ? BILITOT 0.3 03/02/2020 1020  ?  ? ? ? ?RADIOGRAPHIC STUDIES: ?No results found. ? ?ASSESSMENT AND PLAN: This is a very pleasant 51 years old African-American female presented for evaluation of microcytic hypochromic anemia likely secondary to beta thalassemia minor as well as iron deficiency.   ?She was previously treated with Feraheme infusion.   ?She is currently on observation and she is feeling fine except for mild fatigue. ?Her lab work is still pending for today. ?I recommended for the patient to continue on observation and repeat CBC, iron study and ferritin in 6 months unless the pending lab results showed significant iron deficiency anemia, I will arrange for the patient to receive iron infusion in the interval. ?The patient was advised to call immediately if she has any concerning symptoms in the interval. ?The patient voices understanding of current disease status and treatment options and is in agreement with the current care plan. ? ?All questions were answered. The patient knows to call the clinic with any problems, questions or concerns. We can certainly see the patient much sooner if necessary. ? ?Disclaimer: This note was dictated with voice recognition software. Similar sounding words can inadvertently be transcribed and may not be corrected upon review. ? ? ?  ?  ?

## 2022-03-11 ENCOUNTER — Encounter (HOSPITAL_COMMUNITY): Payer: Self-pay | Admitting: Psychiatry

## 2022-03-11 ENCOUNTER — Telehealth (HOSPITAL_BASED_OUTPATIENT_CLINIC_OR_DEPARTMENT_OTHER): Payer: BC Managed Care – PPO | Admitting: Psychiatry

## 2022-03-11 DIAGNOSIS — F4010 Social phobia, unspecified: Secondary | ICD-10-CM

## 2022-03-11 DIAGNOSIS — F331 Major depressive disorder, recurrent, moderate: Secondary | ICD-10-CM | POA: Diagnosis not present

## 2022-03-11 NOTE — Progress Notes (Signed)
Virtual Visit via Video Note  ? ?[facility] ?Initial Assessment Note ? ?Patient Location:Home ?Provider Location:Home Office ? ? ?I connected with [name] by Video and verified that I am Talking with correct person using two identifiers.  ? ?I discussed the limitations, risks, security and privacy concerns of performing an evaluation and management service virtually and the availability of in person appointments. I also discussed with the patient that there may be a patient responsible charge related to this service. The patient expressed understanding and agreed to proceed. ? ?[name] ?[id number] ?51 y.o. ? ?03/11/2022 ?10:39 AM ? ?Chief Complaint:  ?I was referred by my PCP. ? ?History of Present Illness:  ?Jessica [name] is a 51 year old African-American, married, unemployed female who is referred from her PCP for the management of depression and anxiety symptoms.  Patient has no history of depression and anxiety and has been doing well on Effexor until she decided to stop the medication and ran out last year.  She started to feeling very depressed, and about to have mental breakdown around December and recently her PCP is restarted Effexor.  She is feeling better.  She is sleeping better.  She has more energy and she slowly and gradually getting her life together.  Patient admitted it was not a good decision to come off from the medication.  She was having a lot of crying spells, mood swings, highs and lows, severe anxiety, social anxiety with low self-esteem and feeling worthlessness.  She was not comfortable around people and does not want to leave the house.  She admitted having strings sadness but now her sleep is improved.  She is also taking trazodone which is helping her sleep.  She had a good support from her husband.  She has 2 grown up children and live on their own.  She has a 66 year old grandchild and she enjoys the company of her grandchild.  She also slowly and gradually going  back to her listening, walking and now since the weather is better she had to plan to walk every day.  Her appetite is improved and her sleep is improved.  She denies any anhedonia or feeling of hopelessness or any suicidal thoughts.  She has no tremor or shakes or any EPS.  Her prescriptions are given by her primary care physician.  Patient denies any mania, psychosis, hallucination, phobia.  Her weight is stable.  She denies drinking or using any illegal substances.  Patient is currently unemployed but had a good support from her husband.  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 ? ?Past Psychiatric History: ?Patient has history of severe depression and anxiety in 2013.  She had IOP and at that time she was going through very difficult time.  At the time her mother died and she was pregnant and also taking care of her grandmother.  She had to stop working as a Quarry manager.  She had tried Zoloft from PCP but it made her more anxious.  She also had tried Luvox and has been established patient with Dr. Buckner [place].  No history of suicidal attempt. ? ? ?Past Medical History:  ?Diagnosis Date  ? Allergy   ? seasonal  ? Anemia   ? Anxiety   ? Depression   ? HYPERCHOLESTEROLEMIA 10/07/2007  ? Qualifier: Diagnosis of  By: Garen Grams    ? Hyperlipidemia   ? MGUS (monoclonal gammopathy of unknown significance)   ? ? ?Work History; ?History of working as a Quarry manager until [employer]  years ago stopped. ? ?Psychosocial History; ?Patient born and raised in [place].  She is married and lives with her husband.  She has 2 grown up children. ? ?History Of Abuse; ?Denies any history of abuse.   ? ?Substance Abuse History; ?Denies any history of drug use. ? ?Neurologic: ?Headache: No ?Seizure: No ?Paresthesias: No ? ? ?Outpatient Encounter Medications as of 03/11/2022  ?Medication Sig  ? atorvastatin (LIPITOR) 40 MG tablet Take 1 tablet (40 mg total) by mouth daily.  ? hydrochlorothiazide (HYDRODIURIL) 25 MG tablet Take 1 tablet (25 mg  total) by mouth daily.  ? methocarbamol (ROBAXIN) 500 MG tablet TAKE 1 TABLET BY MOUTH FOUR TIMES A DAY (Patient taking differently: every 6 (six) hours as needed.)  ? Omega-3 Fatty Acids (OMEGA-3 EPA FISH OIL PO) Take 1,000 mg by mouth in the morning and at bedtime.  ? ondansetron (ZOFRAN) 4 MG tablet Take 1 tablet (4 mg total) by mouth as directed. Take one Zofran 4 mg tablet 30-60 minutes before each prep dose  ? traZODone (DESYREL) 50 MG tablet Take 0.5-1 tablets (25-50 mg total) by mouth at bedtime as needed for sleep.  ? venlafaxine XR (EFFEXOR XR) 150 MG 24 hr capsule Take 1 capsule (150 mg total) by mouth daily with breakfast.  ? [DISCONTINUED] venlafaxine XR (EFFEXOR-XR) 75 MG 24 hr capsule Take 75 mg by mouth every morning.  ? ?No facility-administered encounter medications on file as of 03/11/2022.  ? ? ?Recent Results (from the past 2160 hour(s))  ?CBC with Differential/Platelet     Status: Abnormal  ? Collection Time: 01/19/22  9:52 AM  ?Result Value Ref Range  ? WBC 5.8 3.4 - 10.8 x10E3/uL  ? RBC 4.56 3.77 - 5.28 x10E6/uL  ? Hemoglobin 10.3 (L) 11.1 - 15.9 g/dL  ? Hematocrit 32.8 (L) 34.0 - 46.6 %  ? MCV 72 (L) 79 - 97 fL  ? MCH 22.6 (L) 26.6 - 33.0 pg  ? MCHC 31.4 (L) 31.5 - 35.7 g/dL  ? RDW 15.9 (H) 11.7 - 15.4 %  ? Platelets 323 150 - 450 x10E3/uL  ? Neutrophils 61 Not Estab. %  ? Lymphs 29 Not Estab. %  ? Monocytes 8 Not Estab. %  ? Eos 1 Not Estab. %  ? Basos 1 Not Estab. %  ? Neutrophils Absolute 3.5 1.4 - 7.0 x10E3/uL  ? Lymphocytes Absolute 1.7 0.7 - 3.1 x10E3/uL  ? Monocytes Absolute 0.5 0.1 - 0.9 x10E3/uL  ? EOS (ABSOLUTE) 0.1 0.0 - 0.4 x10E3/uL  ? Basophils Absolute 0.1 0.0 - 0.2 x10E3/uL  ? Immature Granulocytes 0 Not Estab. %  ? Immature Grans (Abs) 0.0 0.0 - 0.1 x10E3/uL  ?Comprehensive metabolic panel     Status: Abnormal  ? Collection Time: 01/19/22  9:52 AM  ?Result Value Ref Range  ? Glucose 96 70 - 99 mg/dL  ? BUN 7 6 - 24 mg/dL  ? Creatinine, Ser 1.06 (H) 0.57 - 1.00 mg/dL  ? eGFR 64  >59 mL/min/1.73  ? BUN/Creatinine Ratio 7 (L) 9 - 23  ? Sodium 141 134 - 144 mmol/L  ? Potassium 4.0 3.5 - 5.2 mmol/L  ? Chloride 102 96 - 106 mmol/L  ? CO2 26 20 - 29 mmol/L  ? Calcium 9.7 8.7 - 10.2 mg/dL  ? Total Protein 7.6 6.0 - 8.5 g/dL  ? Albumin 4.5 3.8 - 4.9 g/dL  ? Globulin, Total 3.1 1.5 - 4.5 g/dL  ? Albumin/Globulin Ratio 1.5 1.2 - 2.2  ?  Bilirubin Total 0.5 0.0 - 1.2 mg/dL  ? Alkaline Phosphatase 94 44 - 121 IU/L  ? AST 18 0 - 40 IU/L  ? ALT 34 (H) 0 - 32 IU/L  ?Hemoglobin A1c     Status: None  ? Collection Time: 01/19/22  9:52 AM  ?Result Value Ref Range  ? Hgb A1c MFr Bld 5.5 4.8 - 5.6 %  ?  Comment:          Prediabetes: 5.7 - 6.4 ?         Diabetes: >6.4 ?         Glycemic control for adults with diabetes: <7.0 ?  ? Est. average glucose Bld gHb Est-mCnc 111 mg/dL  ?Lipid panel     Status: Abnormal  ? Collection Time: 01/19/22  9:52 AM  ?Result Value Ref Range  ? Cholesterol, Total 193 100 - 199 mg/dL  ? Triglycerides 92 0 - 149 mg/dL  ? HDL 77 >39 mg/dL  ? VLDL Cholesterol Cal 16 5 - 40 mg/dL  ? LDL Chol Calc (NIH) 100 (H) 0 - 99 mg/dL  ? Chol/HDL Ratio 2.5 0.0 - 4.4 ratio  ?  Comment:                                   T. Chol/HDL Ratio ?                                            Men  Women ?                              1/2 Avg.Risk  3.4    3.3 ?                                  Avg.Risk  5.0    4.4 ?                               2X Avg.Risk  9.6    7.1 ?                               3X Avg.Risk 23.4   11.0 ?  ?Hepatitis C antibody     Status: None  ? Collection Time: 01/19/22  9:52 AM  ?Result Value Ref Range  ? Hep C Virus Ab Non Reactive Non Reactive  ?  Comment: HCV antibody alone does not differentiate between previously ?resolved infection and active infection. Equivocal and Reactive ?HCV antibody results should be followed up with an HCV RNA test ?to support the diagnosis of active HCV infection. ?  ?Iron and Iron Binding Capacity ([facility] only)     Status: None  ? Collection Time:  03/09/22  8:08 AM  ?Result Value Ref Range  ? Iron 75 28 - 170 ug/dL  ? TIBC 266 250 - 450 ug/dL  ? Saturation Ratios 28 10.4 - 31.8 %  ? UIBC 191 148 - 442 ug/dL  ?  Comment: Performed at [facility]

## 2022-03-17 ENCOUNTER — Encounter: Payer: Self-pay | Admitting: Gastroenterology

## 2022-03-17 ENCOUNTER — Ambulatory Visit (AMBULATORY_SURGERY_CENTER): Payer: BC Managed Care – PPO | Admitting: Gastroenterology

## 2022-03-17 VITALS — BP 147/80 | HR 80 | Temp 97.8°F | Resp 12 | Ht 66.0 in | Wt 152.8 lb

## 2022-03-17 DIAGNOSIS — Z1211 Encounter for screening for malignant neoplasm of colon: Secondary | ICD-10-CM

## 2022-03-17 MED ORDER — SODIUM CHLORIDE 0.9 % IV SOLN: 500.0000 mL | Freq: Once | INTRAVENOUS | Status: DC

## 2022-03-17 NOTE — Op Note (Signed)
[place] ?Patient Name: Jessica [name] ?Procedure Date: 03/17/2022 8:02 AM ?MRN: [id number] ?Endoscopist: Thornton Park MD, MD ?Age: 51 ?Referring MD:  ?Date of Birth: May 17, 1971 ?Gender: Female ?Account #: [id number] ?Procedure:                Colonoscopy ?Indications:              Screening for colorectal malignant neoplasm, This  ?                          is the patient's first colonoscopy ?Medicines:                Monitored Anesthesia Care ?Procedure:                Pre-Anesthesia Assessment: ?                          - Prior to the procedure, a History and Physical  ?                          was performed, and patient medications and  ?                          allergies were reviewed. The patient's tolerance of  ?                          previous anesthesia was also reviewed. The risks  ?                          and benefits of the procedure and the sedation  ?                          options and risks were discussed with the patient.  ?                          All questions were answered, and informed consent  ?                          was obtained. Prior Anticoagulants: The patient has  ?                          taken no previous anticoagulant or antiplatelet  ?                          agents. ASA Grade Assessment: II - A patient with  ?                          mild systemic disease. After reviewing the risks  ?                          and benefits, the patient was deemed in  ?                          satisfactory condition to undergo the procedure. ?  After obtaining informed consent, the colonoscope  ?                          was passed under direct vision. Throughout the  ?                          procedure, the patient's blood pressure, pulse, and  ?                          oxygen saturations were monitored continuously. The  ?                          Olympus CF-HQ190L (Serial# [id number]) Colonoscope was  ?                          introduced through  the anus and advanced to the the  ?                          cecum, identified by appendiceal orifice and  ?                          ileocecal valve. The colonoscopy was performed  ?                          without difficulty. The patient tolerated the  ?                          procedure well. The quality of the bowel  ?                          preparation was good. There was food fiber residual  ?                          in the right colon. A second forward view of the  ?                          right colon was performed. These required extensive  ?                          lavage and the debris repeatedly clogged the scope.  ?                          I was able to remove the debris to evaluate the  ?                          mucosa with extensive lavage and aspiration. The  ?                          ileocecal valve, appendiceal orifice, and rectum  ?                          were photographed. ?Scope In: 8:06:26 AM ?Scope Out: 8:23:24 AM ?Scope Withdrawal Time: 0 hours 13 minutes 27  seconds  ?Total Procedure Duration: 0 hours 16 minutes 58 seconds  ?Findings:                 The perianal and digital rectal examinations were  ?                          normal except for small hemorrhoids. ?                          Non-bleeding internal hemorrhoids were found. ?                          The ascending colon was redundant. ?Complications:            No immediate complications. ?Estimated Blood Loss:     Estimated blood loss: none. ?Impression:               - Non-bleeding internal hemorrhoids. ?                          - Redundant colon. ?                          - No specimens collected. ?Recommendation:           - Patient has a contact number available for  ?                          emergencies. The signs and symptoms of potential  ?                          delayed complications were discussed with the  ?                          patient. Return to normal activities tomorrow.  ?                           Written discharge instructions were provided to the  ?                          patient. ?                          - Resume previous diet. ?                          - Continue present medications. ?                          - Repeat colonoscopy in 10 years for surveillance,  ?                          earlier with new symptoms. Use a different bowel  ?                          prep at that time. ?                          -  Emerging evidence supports eating a diet of  ?                          fruits, vegetables, grains, calcium, and yogurt  ?                          while reducing red meat and alcohol may reduce the  ?                          risk of colon cancer. ?                          - Thank you for allowing me to be involved in your  ?                          colon cancer prevention. ?Thornton Park MD, MD ?03/17/2022 8:29:36 AM ?This report has been signed electronically. ?

## 2022-03-17 NOTE — Patient Instructions (Signed)
Handout on hemorrhoids given. ? ?YOU HAD AN ENDOSCOPIC PROCEDURE TODAY AT [facility]:   Refer to the procedure report that was given to you for any specific questions about what was found during the examination.  If the procedure report does not answer your questions, please call your gastroenterologist to clarify.  If you requested that your care partner not be given the details of your procedure findings, then the procedure report has been included in a sealed envelope for you to review at your convenience later. ? ?YOU SHOULD EXPECT: Some feelings of bloating in the abdomen. Passage of more gas than usual.  Walking can help get rid of the air that was put into your GI tract during the procedure and reduce the bloating. If you had a lower endoscopy (such as a colonoscopy or flexible sigmoidoscopy) you may notice spotting of blood in your stool or on the toilet paper. If you underwent a bowel prep for your procedure, you may not have a normal bowel movement for a few days. ? ?Please Note:  You might notice some irritation and congestion in your nose or some drainage.  This is from the oxygen used during your procedure.  There is no need for concern and it should clear up in a day or so. ? ?SYMPTOMS TO REPORT IMMEDIATELY: ? ?Following lower endoscopy (colonoscopy or flexible sigmoidoscopy): ? Excessive amounts of blood in the stool ? Significant tenderness or worsening of abdominal pains ? Swelling of the abdomen that is new, acute ? Fever of 100?F or higher ? ? ?For urgent or emergent issues, a gastroenterologist can be reached at any hour by calling [phone]. ?Do not use MyChart messaging for urgent concerns.  ? ? ?DIET:  We do recommend a small meal at first, but then you may proceed to your regular diet.  Drink plenty of fluids but you should avoid alcoholic beverages for 24 hours. ? ?ACTIVITY:  You should plan to take it easy for the rest of today and you should NOT DRIVE or use heavy  machinery until tomorrow (because of the sedation medicines used during the test).   ? ?FOLLOW UP: ?Our staff will call the number listed on your records 48-72 hours following your procedure to check on you and address any questions or concerns that you may have regarding the information given to you following your procedure. If we do not reach you, we will leave a message.  We will attempt to reach you two times.  During this call, we will ask if you have developed any symptoms of COVID 19. If you develop any symptoms (ie: fever, flu-like symptoms, shortness of breath, cough etc.) before then, please call [phone].  If you test positive for Covid 19 in the 2 weeks post procedure, please call and report this information to [place].   ? ?If any biopsies were taken you will be contacted by phone or by letter within the next 1-3 weeks.  Please call us at [phone] if you have not heard about the biopsies in 3 weeks.  ? ? ?SIGNATURES/CONFIDENTIALITY: ?You and/or your care partner have signed paperwork which will be entered into your electronic medical record.  These signatures attest to the fact that that the information above on your After Visit Summary has been reviewed and is understood.  Full responsibility of the confidentiality of this discharge information lies with you and/or your care-partner.  ?

## 2022-03-17 NOTE — Progress Notes (Signed)
Pt has HTN.  She takes a diuretic and obviously prepped prior to sedation.  Her starting MAP was 110 so ideally would have liked to kept her sedated MAP around 90. However pt very "skirmy" throughout and occasionally would open eyes even at MAPs in high 50s.  Therefore, Ephedrine was given with propofol doses.  Immediate at completion pt eyes open and asking when she could eat.  Report to PACU, RN, vss, BBS= Clear. ? ?

## 2022-03-17 NOTE — Progress Notes (Signed)
Pt's states no medical or surgical changes since previsit or office visit. 

## 2022-03-17 NOTE — Progress Notes (Signed)
? ?Referring Provider: Irene Pap, PA-C ?Primary Care Physician:  Irene Pap, PA-C ? ?Indication for Procedure:  Colon cancer screening ? ? ?IMPRESSION:  ?Need for colon cancer screening ?Appropriate candidate for monitored anesthesia care ? ?PLAN: ?Colonoscopy in the [facility] today ? ? ?HPI: Jessica [name] is a 51 y.o. female presents for screening colonoscopy. ? ?No prior colonoscopy or colon cancer screening. ? ?No baseline GI symptoms.  ? ?Paternal grandmother with colon polyps. No known family history of colon cancer or polyps. No family history of uterine/endometrial cancer, pancreatic cancer or gastric/stomach cancer. ? ? ?Past Medical History:  ?Diagnosis Date  ? Allergy   ? seasonal  ? Anemia   ? Anxiety   ? Depression   ? HYPERCHOLESTEROLEMIA 10/07/2007  ? Qualifier: Diagnosis of  By: Garen Grams    ? Hyperlipidemia   ? MGUS (monoclonal gammopathy of unknown significance)   ? ? ?Past Surgical History:  ?Procedure Laterality Date  ? TUBAL LIGATION    ? ? ?Current Outpatient Medications  ?Medication Sig Dispense Refill  ? atorvastatin (LIPITOR) 40 MG tablet Take 1 tablet (40 mg total) by mouth daily. 90 tablet 1  ? hydrochlorothiazide (HYDRODIURIL) 25 MG tablet Take 1 tablet (25 mg total) by mouth daily. 90 tablet 1  ? Omega-3 Fatty Acids (OMEGA-3 EPA FISH OIL PO) Take 1,000 mg by mouth in the morning and at bedtime.    ? traZODone (DESYREL) 50 MG tablet Take 0.5-1 tablets (25-50 mg total) by mouth at bedtime as needed for sleep. 90 tablet 1  ? venlafaxine XR (EFFEXOR XR) 150 MG 24 hr capsule Take 1 capsule (150 mg total) by mouth daily with breakfast. 90 capsule 1  ? methocarbamol (ROBAXIN) 500 MG tablet TAKE 1 TABLET BY MOUTH FOUR TIMES A DAY (Patient taking differently: every 6 (six) hours as needed.) 60 tablet 0  ? ?Current Facility-Administered Medications  ?Medication Dose Route Frequency Provider Last Rate Last Admin  ? 0.9 %  sodium chloride infusion  500 mL Intravenous Once Thornton Park, MD      ? ? ?Allergies as of 03/17/2022 - Review Complete 03/17/2022  ?Allergen Reaction Noted  ? Sulfonamide derivatives    ? ? ?Family History  ?Problem Relation Age of Onset  ? Heart disease Mother   ? Hyperlipidemia Mother   ? Hypertension Mother   ? Alcohol abuse Father   ? Drug abuse Father   ? Hyperlipidemia Father   ? Hypertension Father   ? Alcohol abuse Maternal Uncle   ? Colon polyps Maternal Grandmother   ? Heart disease Maternal Grandmother   ? Hypertension Maternal Grandmother   ? Hyperlipidemia Maternal Grandmother   ? Cancer Paternal Grandmother   ? Diabetes Paternal Grandmother   ? Hypertension Paternal Grandmother   ? Cancer Paternal Grandfather   ? Breast cancer Neg Hx   ? Colon cancer Neg Hx   ? Esophageal cancer Neg Hx   ? Stomach cancer Neg Hx   ? Rectal cancer Neg Hx   ? ? ? ?Physical Exam: ?General:   Alert,  well-nourished, pleasant and cooperative in NAD ?Head:  Normocephalic and atraumatic. ?Eyes:  Sclera clear, no icterus.   Conjunctiva pink. ?Mouth:  No deformity or lesions.   ?Neck:  Supple; no masses or thyromegaly. ?Lungs:  Clear throughout to auscultation.   No wheezes. ?Heart:  Regular rate and rhythm; no murmurs. ?Abdomen:  Soft, non-tender, nondistended, normal bowel sounds, no rebound or guarding.  ?Msk:  Symmetrical. No boney  deformities ?LAD: No inguinal or umbilical LAD ?Extremities:  No clubbing or edema. ?Neurologic:  Alert and  oriented x4;  grossly nonfocal ?Skin:  No obvious rash or bruise. ?Psych:  Alert and cooperative. Normal mood and affect. ? ? ? ? ?Studies/Results: ?No results found. ? ? ? ?Tino Ronan L. Tarri Glenn, MD, MPH ?03/17/2022, 8:01 AM ? ? ? ?  ?

## 2022-03-21 ENCOUNTER — Telehealth: Payer: Self-pay

## 2022-03-21 NOTE — Telephone Encounter (Signed)
Left message on answering machine. 

## 2022-03-27 DIAGNOSIS — Z9089 Acquired absence of other organs: Secondary | ICD-10-CM | POA: Diagnosis not present

## 2022-03-27 DIAGNOSIS — Z9889 Other specified postprocedural states: Secondary | ICD-10-CM | POA: Diagnosis not present

## 2022-03-27 DIAGNOSIS — R196 Halitosis: Secondary | ICD-10-CM | POA: Diagnosis not present

## 2022-03-27 DIAGNOSIS — J3489 Other specified disorders of nose and nasal sinuses: Secondary | ICD-10-CM | POA: Diagnosis not present

## 2022-04-24 ENCOUNTER — Telehealth (HOSPITAL_COMMUNITY): Payer: Self-pay | Admitting: Psychiatry

## 2022-05-02 ENCOUNTER — Telehealth: Payer: Self-pay | Admitting: Physician Assistant

## 2022-05-02 NOTE — Telephone Encounter (Signed)
Pt called and is wanting to know if she is update to on all of her immunizations and is also wanting to know if she can come in for a tb test for school,  Pt can be reached at [phone]

## 2022-05-02 NOTE — Telephone Encounter (Signed)
Jessica [name], please call patient to offer a visit for lab draw for quantiferon gold plus instead of a tb skin test. There are several immunizations documented in her chart. We will have to see the paperwork from her school to determine if she is up to date with all of her immunizations. Thank you.

## 2022-05-03 NOTE — Telephone Encounter (Signed)
Thanks. Do I need to order the lab for her?

## 2022-05-04 ENCOUNTER — Other Ambulatory Visit: Payer: BC Managed Care – PPO

## 2022-05-04 DIAGNOSIS — Z111 Encounter for screening for respiratory tuberculosis: Secondary | ICD-10-CM

## 2022-05-08 LAB — QUANTIFERON-TB GOLD PLUS
QuantiFERON Mitogen Value: >10 IU/mL
QuantiFERON Nil Value: 0.01 IU/mL
QuantiFERON TB1 Ag Value: 0.05 IU/mL
QuantiFERON TB2 Ag Value: 0.05 IU/mL
QuantiFERON-TB Gold Plus: NEGATIVE

## 2022-05-09 ENCOUNTER — Telehealth: Payer: Self-pay | Admitting: Physician Assistant

## 2022-07-19 ENCOUNTER — Encounter: Payer: Self-pay | Admitting: Internal Medicine

## 2022-07-21 ENCOUNTER — Encounter: Payer: BC Managed Care – PPO | Admitting: Medical

## 2022-07-27 ENCOUNTER — Other Ambulatory Visit: Payer: Self-pay | Admitting: Physician Assistant

## 2022-07-27 ENCOUNTER — Encounter: Payer: Self-pay | Admitting: Medical

## 2022-07-27 DIAGNOSIS — F5101 Primary insomnia: Secondary | ICD-10-CM

## 2022-07-27 DIAGNOSIS — E782 Mixed hyperlipidemia: Secondary | ICD-10-CM

## 2022-07-27 DIAGNOSIS — I10 Essential (primary) hypertension: Secondary | ICD-10-CM

## 2022-07-27 DIAGNOSIS — F4010 Social phobia, unspecified: Secondary | ICD-10-CM

## 2022-07-27 DIAGNOSIS — F331 Major depressive disorder, recurrent, moderate: Secondary | ICD-10-CM

## 2022-08-01 IMAGING — MG MM DIGITAL SCREENING BILAT W/ TOMO AND CAD
6 of 10 series · 6 of 30 positions shown · non-contrast
Comparison: Previous exam(s).

CLINICAL DATA: Screening.

EXAM:
DIGITAL SCREENING BILATERAL MAMMOGRAM WITH TOMOSYNTHESIS AND CAD
TECHNIQUE: Bilateral screening digital craniocaudal and mediolateral oblique
mammograms were obtained. Bilateral screening digital breast
tomosynthesis was performed. The images were evaluated with
computer-aided detection.

[L CC synth-2D]
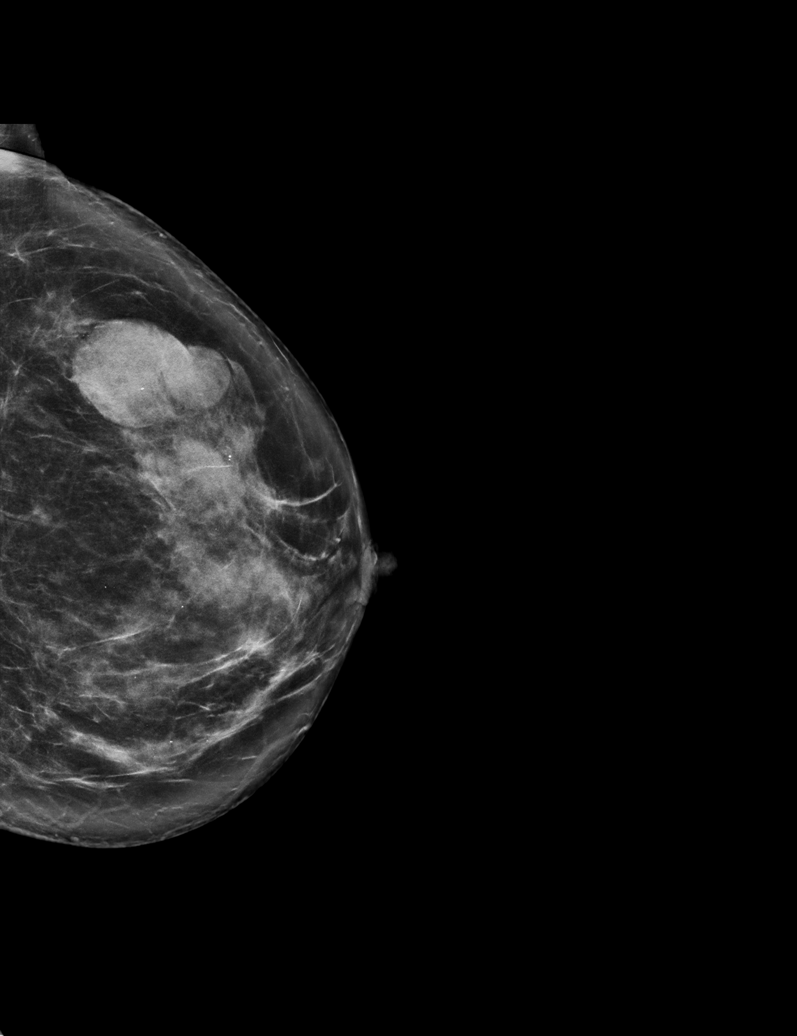

[R MLO synth-2D]
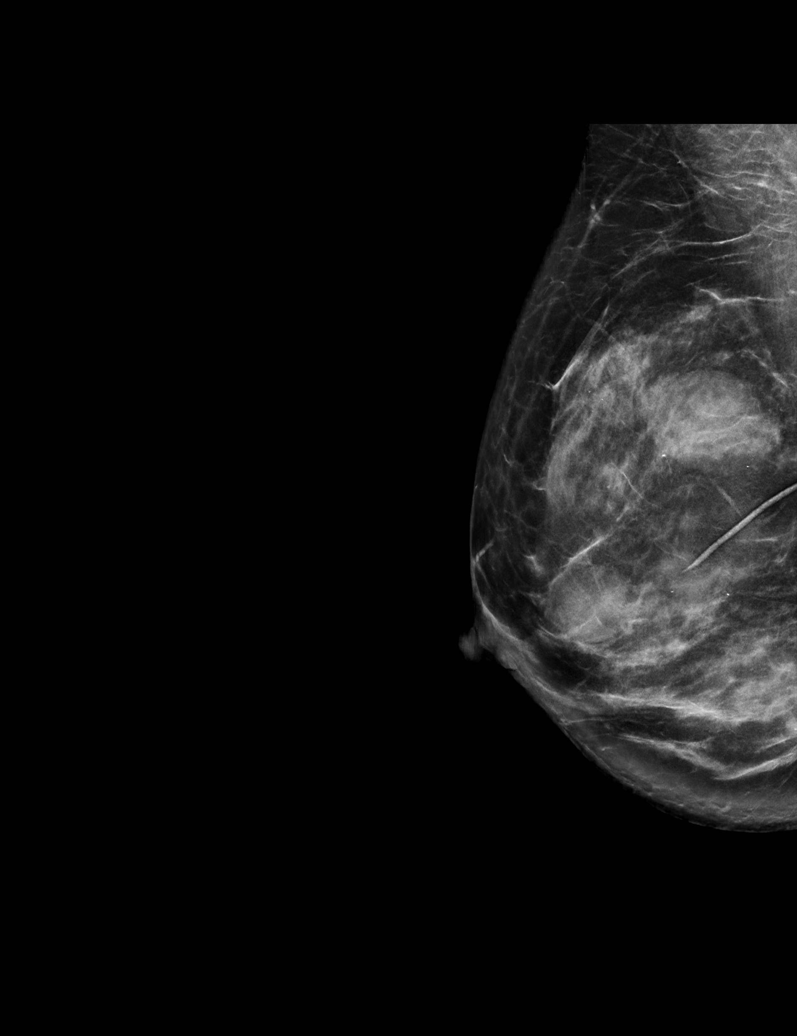

[R CC synth-2D (1 of 2)]
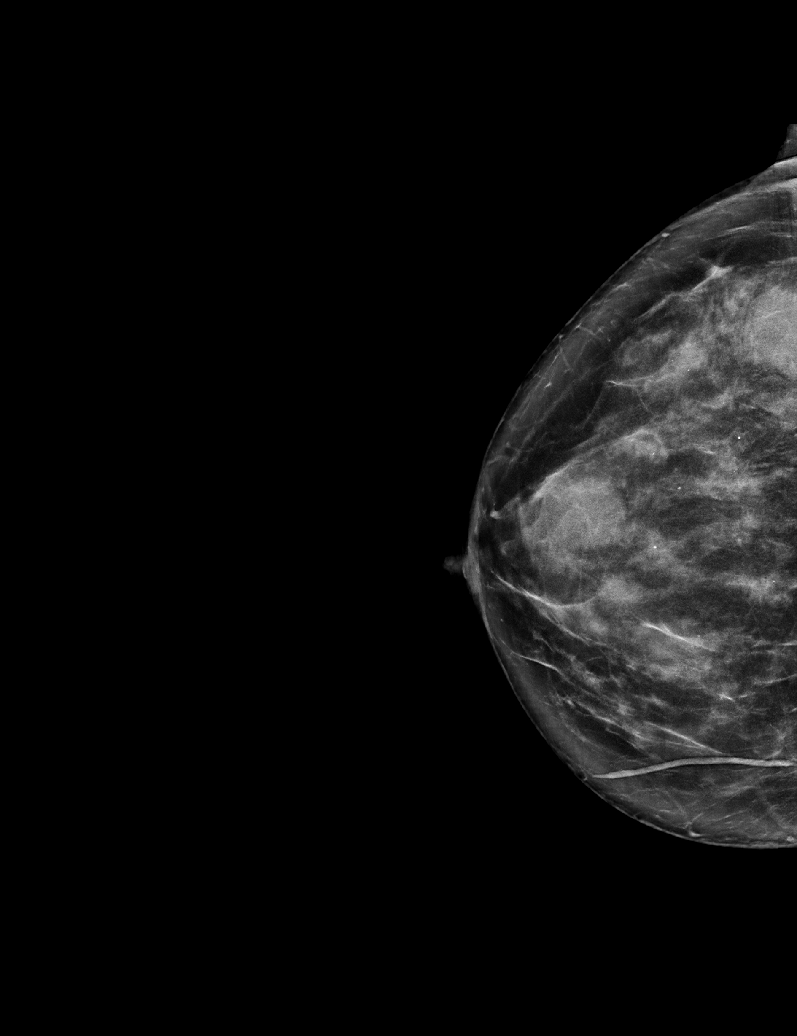

[R CC synth-2D (2 of 2)]
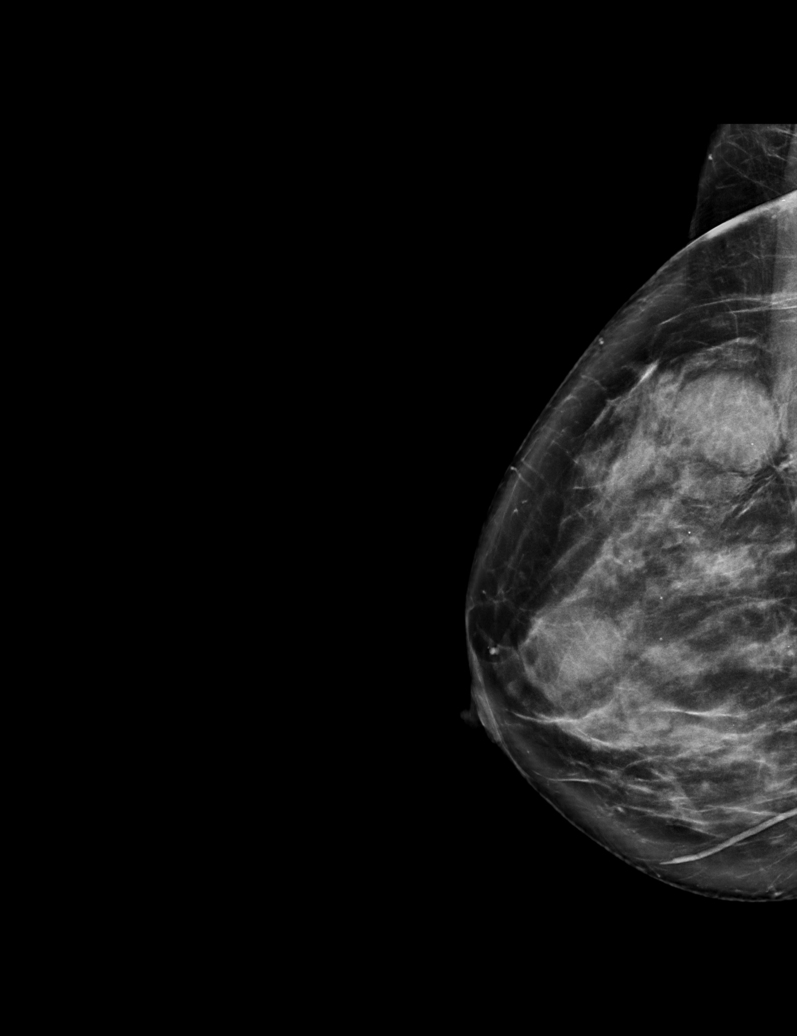

[L MLO synth-2D]
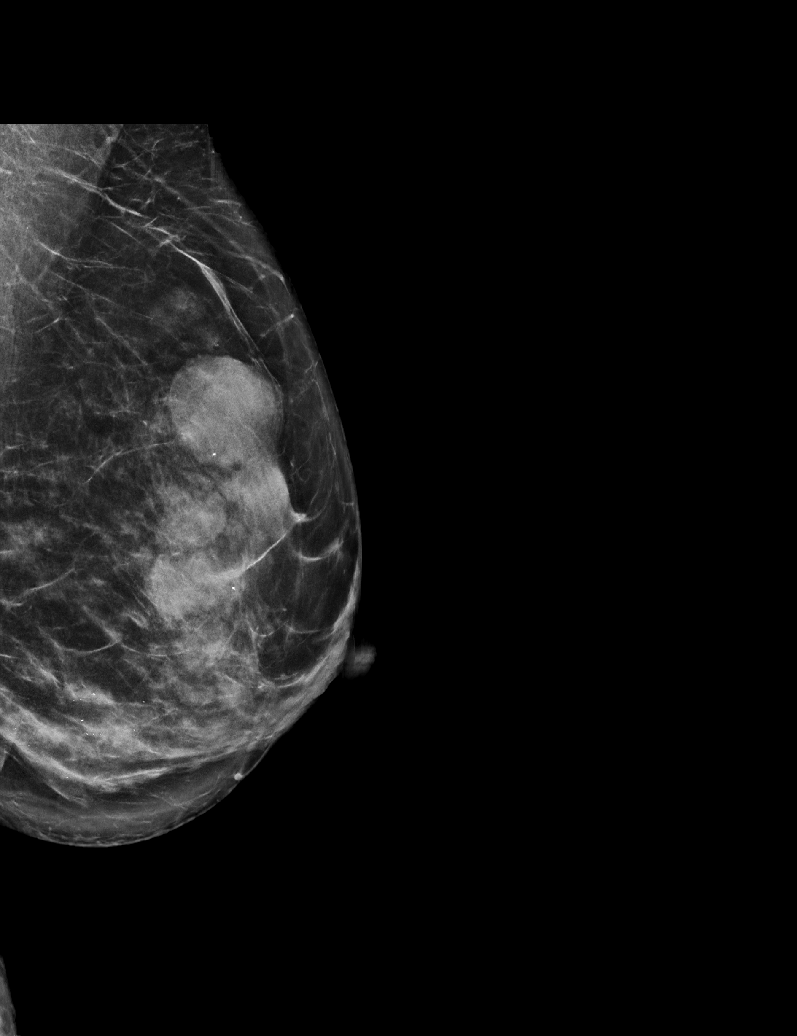

[R CC tomo · tomo slice 35/69.0]
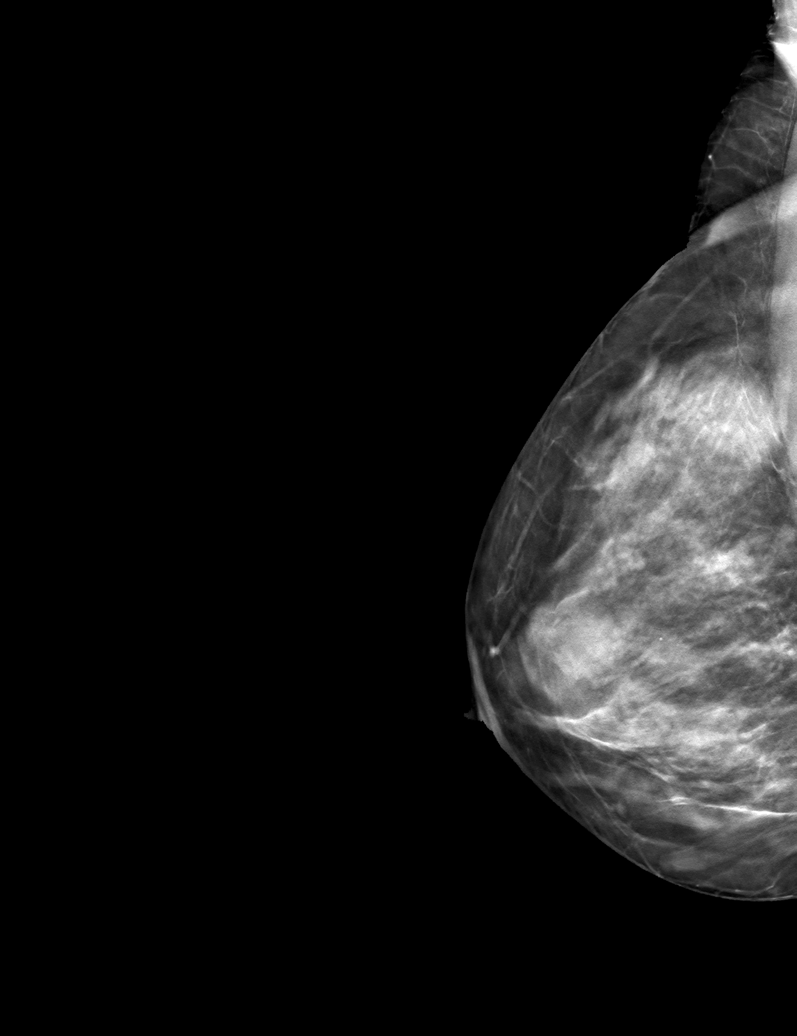

[6 of 30 positions shown; findings below may reference images not displayed]

ACR Breast Density Category c: The breast tissue is heterogeneously
dense, which may obscure small masses.
FINDINGS: There are no findings suspicious for malignancy.
IMPRESSION: No mammographic evidence of malignancy. A result letter of this
screening mammogram will be mailed directly to the patient.

RECOMMENDATION:
Screening mammogram in one year. (Code:Q3-W-BC3)

BI-RADS CATEGORY  1: Negative.

## 2022-08-02 ENCOUNTER — Encounter: Payer: Self-pay | Admitting: Medical

## 2022-08-02 ENCOUNTER — Ambulatory Visit: Payer: BC Managed Care – PPO | Admitting: Medical

## 2022-08-02 VITALS — BP 120/70 | HR 79 | Wt 153.2 lb

## 2022-08-02 DIAGNOSIS — F331 Major depressive disorder, recurrent, moderate: Secondary | ICD-10-CM

## 2022-08-02 DIAGNOSIS — F411 Generalized anxiety disorder: Secondary | ICD-10-CM

## 2022-08-02 DIAGNOSIS — F5101 Primary insomnia: Secondary | ICD-10-CM

## 2022-08-02 DIAGNOSIS — E782 Mixed hyperlipidemia: Secondary | ICD-10-CM

## 2022-08-02 DIAGNOSIS — F3342 Major depressive disorder, recurrent, in full remission: Secondary | ICD-10-CM

## 2022-08-02 DIAGNOSIS — F429 Obsessive-compulsive disorder, unspecified: Secondary | ICD-10-CM | POA: Diagnosis not present

## 2022-08-02 DIAGNOSIS — Z136 Encounter for screening for cardiovascular disorders: Secondary | ICD-10-CM

## 2022-08-02 DIAGNOSIS — Z23 Encounter for immunization: Secondary | ICD-10-CM

## 2022-08-02 DIAGNOSIS — I10 Essential (primary) hypertension: Secondary | ICD-10-CM

## 2022-08-02 DIAGNOSIS — F4010 Social phobia, unspecified: Secondary | ICD-10-CM

## 2022-08-02 MED ORDER — TRAZODONE HCL 50 MG PO TABS: 25.0000 mg | ORAL_TABLET | Freq: Every evening | ORAL | 1 refills | Status: DC | PRN

## 2022-08-02 MED ORDER — HYDROCHLOROTHIAZIDE 25 MG PO TABS: 25.0000 mg | ORAL_TABLET | Freq: Every day | ORAL | 1 refills | Status: DC

## 2022-08-02 MED ORDER — ATORVASTATIN CALCIUM 40 MG PO TABS: 40.0000 mg | ORAL_TABLET | Freq: Every day | ORAL | 1 refills | Status: DC

## 2022-08-02 MED ORDER — VENLAFAXINE HCL ER 150 MG PO CP24: 150.0000 mg | ORAL_CAPSULE | Freq: Every day | ORAL | 1 refills | Status: DC

## 2022-08-02 NOTE — Progress Notes (Signed)
Subjective:  Jessica [name] is a 51 y.o. female who presents for Chief Complaint  Patient presents with   med check    Med check. Flu shot. Having some headaches x 1-2 weeks     Here for med check  Feeling health but having some headaches the last 2 weeks. Dull irritating headache but persistent the last 2 weeks.  Attributes to menopause, hormones  Hyperlipidemia - compliant with lipitor without side effects, takes OTC fish oil as well.  Insomnia - uses trazodone '50mg'$  nightly for sleeps  Been on Effexor for at least 3 years or more, for mood and menopausal symptoms  Hypertension - on hydrochlorothiazide for 3-4 years.    Exercise - on the move constantly, goes to the gym.    In school currently for phlebotomy.    No other aggravating or relieving factors.    No other c/o.  Past Medical History:  Diagnosis Date   Allergy    seasonal   Anemia    Anxiety    Depression    HYPERCHOLESTEROLEMIA 10/07/2007   Qualifier: Diagnosis of  By: Garen Grams     Hyperlipidemia    MGUS (monoclonal gammopathy of unknown significance)    Current Outpatient Medications on File Prior to Visit  Medication Sig Dispense Refill   Omega-3 Fatty Acids (OMEGA-3 EPA FISH OIL PO) Take 1,000 mg by mouth in the morning and at bedtime.     No current facility-administered medications on file prior to visit.   Family History  Problem Relation Age of Onset   Heart disease Mother 37       MI   Hyperlipidemia Mother    Hypertension Mother    Alcohol abuse Father    Drug abuse Father    Hyperlipidemia Father    Hypertension Father    Alcohol abuse Maternal Uncle    Colon polyps Maternal Grandmother    Heart disease Maternal Grandmother    Hypertension Maternal Grandmother    Hyperlipidemia Maternal Grandmother    Cancer Paternal Grandmother    Diabetes Paternal Grandmother    Hypertension Paternal Grandmother    Cancer Paternal Grandfather    Breast cancer Neg Hx    Colon cancer Neg  Hx    Esophageal cancer Neg Hx    Stomach cancer Neg Hx    Rectal cancer Neg Hx      The following portions of the patient's history were reviewed and updated as appropriate: allergies, current medications, past family history, past medical history, past social history, past surgical history and problem list.  ROS Otherwise as in subjective above    Objective: BP 120/70   Pulse 79   Wt 153 lb 3.2 oz (69.5 kg)   BMI 24.73 kg/m   Wt Readings from Last 3 Encounters:  08/02/22 153 lb 3.2 oz (69.5 kg)  03/17/22 152 lb 12.8 oz (69.3 kg)  03/09/22 159 lb 6.4 oz (72.3 kg)   General appearance: alert, no distress, well developed, well nourished Neck: supple, no lymphadenopathy, no thyromegaly, no masses Heart: RRR, normal S1, S2, no murmurs Lungs: CTA bilaterally, no wheezes, rhonchi, or rales Pulses: 2+ radial pulses, 2+ pedal pulses, normal cap refill Ext: no edema   Assessment: Encounter Diagnoses  Name Primary?   Essential hypertension Yes   Mixed hyperlipidemia    Obsessive-compulsive disorder, unspecified type    Primary insomnia    Social anxiety disorder    ANXIETY DISORDER, GENERALIZED    Major depressive disorder, recurrent episode, in full remission (  [place])    Needs flu shot    Screening for heart disease    Major depressive disorder, recurrent episode, moderate ([place])    Need for shingles vaccine      Plan: She really would like to come off most of her medication.  We discussed whether that could be possible or not depends on a lot of factors.  We spent time talking about scenarios, healthy lifestyle, exercise, lab monitoring, ways to potentially come off some of her medications  Hypertension-continue medication for now.  She would like to get off medicine at some point.  We discussed diet, exercise, blood pressure monitoring.  For now she will continue same medicine  Hyperlipidemia-she will get CT coronary test.  If the CT coronary test shows no calcium at all  and then we can stop the statin for sure.  Overall her major risk is history of mother.  She will continue her statin for the time being  Insomnia-does fine on trazodone  Anxiety, menopausal symptoms-doing okay on Effexor  Headaches-we talked about this briefly.  I recommend she keep a headache diary, limit caffeine, limit salt, limit stress.  Follow-up if headaches persist  Counseled on the influenza virus vaccine.  Vaccine information sheet given.  Influenza vaccine given after consent obtained.  Counseled on the Shingrix vaccine.  Vaccine information sheet given. Shingrix #2 vaccine given after consent obtained.     Jessica [name] was seen today for med check.  Diagnoses and all orders for this visit:  Essential hypertension -     CT CARDIAC SCORING ([facility] LOCATIONS ONLY); Future -     hydrochlorothiazide (HYDRODIURIL) 25 MG tablet; Take 1 tablet (25 mg total) by mouth daily.  Mixed hyperlipidemia -     CT CARDIAC SCORING ([facility] LOCATIONS ONLY); Future -     atorvastatin (LIPITOR) 40 MG tablet; Take 1 tablet (40 mg total) by mouth daily.  Obsessive-compulsive disorder, unspecified type  Primary insomnia -     traZODone (DESYREL) 50 MG tablet; Take 0.5-1 tablets (25-50 mg total) by mouth at bedtime as needed for sleep.  Social anxiety disorder -     venlafaxine XR (EFFEXOR XR) 150 MG 24 hr capsule; Take 1 capsule (150 mg total) by mouth daily with breakfast.  ANXIETY DISORDER, GENERALIZED  Major depressive disorder, recurrent episode, in full remission ([place])  Needs flu shot -     Flu Vaccine QUAD 54moIM (Fluarix, Fluzone & Alfiuria Quad PF)  Screening for heart disease -     CT CARDIAC SCORING ([facility] LOCATIONS ONLY); Future  Major depressive disorder, recurrent episode, moderate ([facility]) -     venlafaxine XR (EFFEXOR XR) 150 MG 24 hr capsule; Take 1 capsule (150 mg total) by mouth daily with breakfast.  Need for shingles vaccine -     Varicella-zoster vaccine IM    Follow up:  pending CT coronary test

## 2022-08-02 NOTE — Patient Instructions (Signed)
Monitor blood pressures at least 3 to 4 days/week over the next month.  Given the headaches and given discussion about your medications and they does get a better idea of how your blood pressures are looking over the next few weeks  Goal is 120/70  Plan to get the CT test to check for cholesterol buildup in the arteries.  If this shows calcium score of 0 then you can stop your cholesterol pill for sure  Headaches Limit caffeine Drink 80 to 100 ounces of water a day Keep a headache diary of your symptoms how often you are getting the headaches and what seems to trigger them If your headaches persist we may need to check some other things out Make sure you have seen your eye doctor in the last year Limit caffeine Limit screen time in particularly in dark rooms

## 2022-08-18 ENCOUNTER — Encounter: Payer: Self-pay | Admitting: Internal Medicine

## 2022-08-18 ENCOUNTER — Ambulatory Visit
Admission: RE | Admit: 2022-08-18 | Discharge: 2022-08-18 | Disposition: A | Discharge status: Home / Self Care | Payer: No Typology Code available for payment source | Source: Ambulatory Visit | Attending: Medical | Admitting: Medical

## 2022-08-18 DIAGNOSIS — E782 Mixed hyperlipidemia: Secondary | ICD-10-CM

## 2022-08-18 DIAGNOSIS — I10 Essential (primary) hypertension: Secondary | ICD-10-CM

## 2022-08-18 DIAGNOSIS — Z136 Encounter for screening for cardiovascular disorders: Secondary | ICD-10-CM

## 2022-08-22 ENCOUNTER — Encounter: Payer: Self-pay | Admitting: Internal Medicine

## 2022-09-07 ENCOUNTER — Other Ambulatory Visit: Payer: BC Managed Care – PPO

## 2022-09-07 ENCOUNTER — Ambulatory Visit: Payer: BC Managed Care – PPO | Admitting: Internal Medicine

## 2022-09-26 ENCOUNTER — Inpatient Hospital Stay: Payer: BC Managed Care – PPO | Admitting: Internal Medicine

## 2022-09-26 ENCOUNTER — Inpatient Hospital Stay: Payer: BC Managed Care – PPO | Attending: Internal Medicine

## 2022-09-26 VITALS — BP 153/84 | HR 93 | Temp 98.2°F | Resp 16 | Wt 154.2 lb

## 2022-09-26 DIAGNOSIS — N920 Excessive and frequent menstruation with regular cycle: Secondary | ICD-10-CM | POA: Diagnosis not present

## 2022-09-26 DIAGNOSIS — D509 Iron deficiency anemia, unspecified: Secondary | ICD-10-CM

## 2022-09-26 DIAGNOSIS — Z79899 Other long term (current) drug therapy: Secondary | ICD-10-CM | POA: Diagnosis not present

## 2022-09-26 DIAGNOSIS — D500 Iron deficiency anemia secondary to blood loss (chronic): Secondary | ICD-10-CM | POA: Insufficient documentation

## 2022-09-26 DIAGNOSIS — D563 Thalassemia minor: Secondary | ICD-10-CM | POA: Insufficient documentation

## 2022-09-26 DIAGNOSIS — D472 Monoclonal gammopathy: Secondary | ICD-10-CM | POA: Insufficient documentation

## 2022-09-26 LAB — CBC WITH DIFFERENTIAL (CANCER CENTER ONLY)
Abs Immature Granulocytes: 0.02 10*3/uL (ref 0.00–0.07)
Basophils Absolute: 0.1 10*3/uL (ref 0.0–0.1)
Basophils Relative: 1 %
Eosinophils Absolute: 0 10*3/uL (ref 0.0–0.5)
Eosinophils Relative: 0 %
HCT: 37 % (ref 36.0–46.0)
Hemoglobin: 11.5 g/dL — ABNORMAL LOW (ref 12.0–15.0)
Immature Granulocytes: 0 %
Lymphocytes Relative: 29 %
Lymphs Abs: 2 10*3/uL (ref 0.7–4.0)
MCH: 23 pg — ABNORMAL LOW (ref 26.0–34.0)
MCHC: 31.1 g/dL (ref 30.0–36.0)
MCV: 73.9 fL — ABNORMAL LOW (ref 80.0–100.0)
Monocytes Absolute: 0.5 10*3/uL (ref 0.1–1.0)
Monocytes Relative: 7 %
Neutro Abs: 4.3 10*3/uL (ref 1.7–7.7)
Neutrophils Relative %: 63 %
Platelet Count: 326 10*3/uL (ref 150–400)
RBC: 5.01 MIL/uL (ref 3.87–5.11)
RDW: 15.9 % — ABNORMAL HIGH (ref 11.5–15.5)
WBC Count: 6.9 10*3/uL (ref 4.0–10.5)
nRBC: 0 % (ref 0.0–0.2)

## 2022-09-26 LAB — IRON AND IRON BINDING CAPACITY (CC-WL,HP ONLY)
Iron: 48 ug/dL (ref 28–170)
Saturation Ratios: 15 % (ref 10.4–31.8)
TIBC: 312 ug/dL (ref 250–450)
UIBC: 264 ug/dL (ref 148–442)

## 2022-09-26 LAB — FERRITIN: Ferritin: 287 ng/mL (ref 11–307)

## 2022-09-26 NOTE — Progress Notes (Signed)
[facility] [phone]   [phone]  OFFICE PROGRESS NOTE  Carlena Hurl, PA-C [address] [place] [address]  DIAGNOSIS:  1) Microcytic, hypochromic anemia secondary to iron deficiency secondary to menorrhagia. 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  PRIOR THERAPY: Feraheme infusion on as-needed basis  CURRENT THERAPY: None  INTERVAL HISTORY: Jessica [name] 51 y.o. female returns to the clinic today for 24-monthfollow-up visit.  The patient is feeling fine today with no concerning complaints.  She denied having any fatigue or weakness.  She has no nausea, vomiting, diarrhea or constipation.  She has no headache or visual changes and no dizzy spells.  She denied having any recent weight loss or night sweats.  She eats more beets supplements.  She is here today for evaluation with repeat blood work.   MEDICAL HISTORY: Past Medical History:  Diagnosis Date   Allergy    seasonal   Anemia    Anxiety    Depression    HYPERCHOLESTEROLEMIA 10/07/2007   Qualifier: Diagnosis of  By: AGaren Grams    Hyperlipidemia    MGUS (monoclonal gammopathy of unknown significance)     ALLERGIES:  is allergic to sulfonamide derivatives.  MEDICATIONS:  Current Outpatient Medications  Medication Sig Dispense Refill   atorvastatin (LIPITOR) 40 MG tablet Take 1 tablet (40 mg total) by mouth daily. 90 tablet 1   hydrochlorothiazide (HYDRODIURIL) 25 MG tablet Take 1 tablet (25 mg total) by mouth daily. 90 tablet 1   Omega-3 Fatty Acids (OMEGA-3 EPA FISH OIL PO) Take 1,000 mg by mouth in the morning and at bedtime.     traZODone (DESYREL) 50 MG tablet Take 0.5-1 tablets (25-50 mg total) by mouth at bedtime as needed for sleep. 90 tablet 1   venlafaxine XR (EFFEXOR XR) 150 MG 24 hr capsule Take 1 capsule (150 mg total) by mouth daily with breakfast. 90 capsule 1   No current  facility-administered medications for this visit.    SURGICAL HISTORY:  Past Surgical History:  Procedure Laterality Date   TUBAL LIGATION      REVIEW OF SYSTEMS:  A comprehensive review of systems was negative except for: Constitutional: positive for fatigue   PHYSICAL EXAMINATION: General appearance: alert, cooperative, fatigued, and no distress Head: Normocephalic, without obvious abnormality, atraumatic Neck: no adenopathy, no JVD, supple, symmetrical, trachea midline, and thyroid not enlarged, symmetric, no tenderness/mass/nodules Lymph nodes: Cervical, supraclavicular, and axillary nodes normal. Resp: clear to auscultation bilaterally Back: symmetric, no curvature. ROM normal. No CVA tenderness. Cardio: regular rate and rhythm, S1, S2 normal, no murmur, click, rub or gallop GI: soft, non-tender; bowel sounds normal; no masses,  no organomegaly Extremities: extremities normal, atraumatic, no cyanosis or edema  ECOG PERFORMANCE STATUS: 1 - Symptomatic but completely ambulatory  Blood pressure (!) 153/84, pulse 93, temperature 98.2 F (36.8 C), temperature source Oral, resp. rate 16, weight 154 lb 4 oz (70 kg), SpO2 100 %.  LABORATORY DATA: Lab Results  Component Value Date   WBC 6.9 09/26/2022   HGB 11.5 (L) 09/26/2022   HCT 37.0 09/26/2022   MCV 73.9 (L) 09/26/2022   PLT 326 09/26/2022      Chemistry      Component Value Date/Time   NA 141 01/19/2022 0952   K 4.0 01/19/2022 0952   CL 102 01/19/2022 0952   CO2 26 01/19/2022 0952   BUN 7 01/19/2022 0952   CREATININE 1.06 (H)  01/19/2022 0952   CREATININE 1.05 (H) 03/02/2020 1020   CREATININE 0.98 09/10/2017 1333      Component Value Date/Time   CALCIUM 9.7 01/19/2022 0952   ALKPHOS 94 01/19/2022 0952   AST 18 01/19/2022 0952   AST 14 (L) 03/02/2020 1020   ALT 34 (H) 01/19/2022 0952   ALT 10 03/02/2020 1020   BILITOT 0.5 01/19/2022 0952   BILITOT 0.3 03/02/2020 1020       RADIOGRAPHIC STUDIES: No results  found.  ASSESSMENT AND PLAN: This is a very pleasant 51 years old African-American female presented for evaluation of microcytic hypochromic anemia likely secondary to beta thalassemia minor as well as iron deficiency.   She was previously treated with Feraheme infusion.   The patient is currently on observation and she is feeling fine. She had repeat CBC performed earlier today that showed hemoglobin up to 11.5 and hematocrit 37.0%.  Iron study and ferritin are still pending. I recommended for the patient to continue on observation for now with repeat blood work in 6 months but if the pending iron studies showed significant deficiency, I would consider her for iron infusion in the interval. The patient was advised to call immediately if she has any concerning symptoms in the interval. The patient voices understanding of current disease status and treatment options and is in agreement with the current care plan.  All questions were answered. The patient knows to call the clinic with any problems, questions or concerns. We can certainly see the patient much sooner if necessary.  Disclaimer: This note was dictated with voice recognition software. Similar sounding words can inadvertently be transcribed and may not be corrected upon review.

## 2023-01-23 ENCOUNTER — Ambulatory Visit: Payer: BC Managed Care – PPO | Admitting: Nurse Practitioner

## 2023-01-23 ENCOUNTER — Encounter: Payer: Self-pay | Admitting: Nurse Practitioner

## 2023-01-23 VITALS — BP 128/72 | HR 80 | Ht 66.5 in | Wt 157.0 lb

## 2023-01-23 DIAGNOSIS — E782 Mixed hyperlipidemia: Secondary | ICD-10-CM

## 2023-01-23 DIAGNOSIS — I10 Essential (primary) hypertension: Secondary | ICD-10-CM | POA: Diagnosis not present

## 2023-01-23 DIAGNOSIS — F331 Major depressive disorder, recurrent, moderate: Secondary | ICD-10-CM | POA: Diagnosis not present

## 2023-01-23 DIAGNOSIS — L608 Other nail disorders: Secondary | ICD-10-CM

## 2023-01-23 DIAGNOSIS — Z0000 Encounter for general adult medical examination without abnormal findings: Secondary | ICD-10-CM

## 2023-01-23 DIAGNOSIS — N951 Menopausal and female climacteric states: Secondary | ICD-10-CM

## 2023-01-23 DIAGNOSIS — F4010 Social phobia, unspecified: Secondary | ICD-10-CM | POA: Diagnosis not present

## 2023-01-23 DIAGNOSIS — R6882 Decreased libido: Secondary | ICD-10-CM

## 2023-01-23 DIAGNOSIS — F429 Obsessive-compulsive disorder, unspecified: Secondary | ICD-10-CM

## 2023-01-23 MED ORDER — VENLAFAXINE HCL ER 75 MG PO CP24: 75.0000 mg | ORAL_CAPSULE | Freq: Every day | ORAL | 3 refills | Status: DC

## 2023-01-23 NOTE — Progress Notes (Signed)
Worthy Keeler, DNP, AGNP-c [place], [place] [address] [place] [phone]  BP 128/72   Pulse 80   Ht 5' 6.5" (1.689 m)   Wt 157 lb (71.2 kg)   LMP 01/15/2023   BMI 24.96 kg/m    Subjective:    Patient ID: Jessica [name], female    DOB: 11-04-1971, 52 y.o.   MRN: [id number]  HPI: Jessica [name] is a 52 y.o. female presenting on 01/23/2023 for comprehensive medical examination.   The patient, Jessica [name], reports a worsening of stress, anxiety, and depression, with a notable history of social anxiety. 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 However, due to escalating stress levels, she resigned from her position after four weeks.  Currently, the patient is on Effexor 150mg  to manage her anxiety and depression but expresses concerns over its diminishing effectiveness. Additionally, she takes Trazodone, which aids in relaxation and sleep. She mentions a resurgence of panic attacks, which had been part of her medical history. She expresses worries about her appearance, having odor, etc that are not corroborated by others.   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 Although she has sought counseling previously, she now feels it no longer benefits her.    The patient has observed the development of ridges on her nails, raising concerns about a potential vitamin deficiency. She clarifies that she has never had artificial nails and that this condition first became apparent two years ago.  IMMUNIZATIONS:   Flu: Flu vaccine completed elsewhere this season Prevnar 13: Prevnar 13 N/A for this patient Prevnar 20: Prevnar 20 N/A for this patient Pneumovax 23: Pneumovax 23 N/A for this patient Vac Shingrix: Shingrix completed, documentation in chart (Dose #  2/2) HPV: HPV N/A for this patient Tetanus: Tetanus completed in the last 10 years  HEALTH MAINTENANCE: Pap Smear HM Status: is up to date Mammogram HM Status: is due and to be scheduled by patient for later completion Colon Cancer Screening HM Status: is up to date Bone Density HM Status: is not applicable for this patient STI Testing HM Status: was declined   She reports regular vision exams q1-5y: Yes  She reports regular dental exams q 34m:  Yes  The patient eats a regular, healthy diet. She endorses exercise and/or activity of:  none currently  She currently: Marital Status: married Living situation: with spouse  Pertinent items are noted in HPI.  Most Recent Depression Screen:     01/23/2023    9:35 AM 08/02/2022    3:47 PM 01/19/2022    9:21 AM 01/19/2022    9:03 AM 03/07/2021   10:55 AM  Depression screen PHQ 2/9  Decreased Interest 1 0 1 1 0  Down, Depressed, Hopeless 2 0 2 3 0  PHQ - 2 Score 3 0 3 4 0  Altered sleeping 0  0 0   Tired, decreased energy 0  2 1   Change in appetite 1  2 2    Feeling bad or failure about yourself  3  2 3    Trouble concentrating 3  1 0   Moving slowly or fidgety/restless 0  1 0   Suicidal thoughts 0  1 1   PHQ-9 Score 10  12 11    Difficult doing work/chores Somewhat difficult  Very  difficult Very difficult    Most Recent Anxiety Screen:     01/19/2022    9:22 AM 09/10/2019    9:22 AM 04/11/2018    5:15 PM  GAD 7 : Generalized Anxiety Score  Nervous, Anxious, on Edge 2 1 2   Control/stop worrying 1 1 3   Worry too much - different things 1 1 3   Trouble relaxing 1 1 2   Restless 1 0 0  Easily annoyed or irritable 1 1 1   Afraid - awful might happen 2 1 2   Total GAD 7 Score 9 6 13   Anxiety Difficulty Very difficult Not difficult at all Somewhat difficult   Most Recent Fall Screen:    01/23/2023    9:35 AM 08/02/2022    3:47 PM 01/19/2022    9:01 AM 03/07/2021   10:55 AM 11/24/2020    1:55 PM  Fall Risk   Falls in the past year? 0 0 1  0 0  Number falls in past yr: 0 0 0 0 0  Injury with Fall? 0 0 1 0 0  Comment   pt has a bruise on upper left arm    Risk for fall due to : No Fall Risks No Fall Risks  No Fall Risks   Follow up Falls evaluation completed Falls evaluation completed Falls evaluation completed;Education provided Falls evaluation completed Falls evaluation completed    Past medical history, surgical history, medications, allergies, family history and social history reviewed with patient today and changes made to appropriate areas of the chart.  Past Medical History:  Past Medical History:  Diagnosis Date   Allergy    seasonal   Anemia    Anxiety    Depression    HYPERCHOLESTEROLEMIA 10/07/2007   Qualifier: Diagnosis of  By: Garen Grams     Hyperlipidemia    MGUS (monoclonal gammopathy of unknown significance)    Medications:  Current Outpatient Medications on File Prior to Visit  Medication Sig   atorvastatin (LIPITOR) 40 MG tablet Take 1 tablet (40 mg total) by mouth daily.   hydrochlorothiazide (HYDRODIURIL) 25 MG tablet Take 1 tablet (25 mg total) by mouth daily.   Omega-3 Fatty Acids (OMEGA-3 EPA FISH OIL PO) Take 1,000 mg by mouth in the morning and at bedtime.   traZODone (DESYREL) 50 MG tablet Take 0.5-1 tablets (25-50 mg total) by mouth at bedtime as needed for sleep.   venlafaxine XR (EFFEXOR XR) 150 MG 24 hr capsule Take 1 capsule (150 mg total) by mouth daily with breakfast.   No current facility-administered medications on file prior to visit.   Surgical History:  Past Surgical History:  Procedure Laterality Date   TUBAL LIGATION     Allergies:  Allergies  Allergen Reactions   Sulfonamide Derivatives     REACTION: hives   Family History:  Family History  Problem Relation Age of Onset   Heart disease Mother 67       MI   Hyperlipidemia Mother    Hypertension Mother    Alcohol abuse Father    Drug abuse Father    Hyperlipidemia Father    Hypertension Father     Alcohol abuse Maternal Uncle    Colon polyps Maternal Grandmother    Heart disease Maternal Grandmother    Hypertension Maternal Grandmother    Hyperlipidemia Maternal Grandmother    Cancer Paternal Grandmother    Diabetes Paternal Grandmother    Hypertension Paternal Grandmother    Cancer Paternal Grandfather    Breast cancer Neg  Hx    Colon cancer Neg Hx    Esophageal cancer Neg Hx    Stomach cancer Neg Hx    Rectal cancer Neg Hx        Objective:    BP 128/72   Pulse 80   Ht 5' 6.5" (1.689 m)   Wt 157 lb (71.2 kg)   LMP 01/15/2023   BMI 24.96 kg/m   Wt Readings from Last 3 Encounters:  01/23/23 157 lb (71.2 kg)  09/26/22 154 lb 4 oz (70 kg)  08/02/22 153 lb 3.2 oz (69.5 kg)    Physical Exam Vitals and nursing note reviewed.  Constitutional:      General: She is not in acute distress.    Appearance: Normal appearance.  HENT:     Head: Normocephalic and atraumatic.     Right Ear: Hearing, tympanic membrane, ear canal and external ear normal.     Left Ear: Hearing, tympanic membrane, ear canal and external ear normal.     Nose: Nose normal.     Right Sinus: No maxillary sinus tenderness or frontal sinus tenderness.     Left Sinus: No maxillary sinus tenderness or frontal sinus tenderness.     Mouth/Throat:     Lips: Pink.     Mouth: Mucous membranes are moist.     Pharynx: Oropharynx is clear.  Eyes:     General: Lids are normal. Vision grossly intact.     Extraocular Movements: Extraocular movements intact.     Conjunctiva/sclera: Conjunctivae normal.     Pupils: Pupils are equal, round, and reactive to light.     Funduscopic exam:    Right eye: Red reflex present.        Left eye: Red reflex present.    Visual Fields: Right eye visual fields normal and left eye visual fields normal.  Neck:     Thyroid: No thyromegaly.     Vascular: No carotid bruit.  Cardiovascular:     Rate and Rhythm: Normal rate and regular rhythm.     Chest Wall: PMI is not  displaced.     Pulses: Normal pulses.          Dorsalis pedis pulses are 2+ on the right side and 2+ on the left side.       Posterior tibial pulses are 2+ on the right side and 2+ on the left side.     Heart sounds: Normal heart sounds. No murmur heard. Pulmonary:     Effort: Pulmonary effort is normal. No respiratory distress.     Breath sounds: Normal breath sounds.  Abdominal:     General: Abdomen is flat. Bowel sounds are normal. There is no distension.     Palpations: Abdomen is soft. There is no hepatomegaly, splenomegaly or mass.     Tenderness: There is no abdominal tenderness. There is no right CVA tenderness, left CVA tenderness, guarding or rebound.  Musculoskeletal:        General: Normal range of motion.     Cervical back: Full passive range of motion without pain, normal range of motion and neck supple. No tenderness.     Right lower leg: No edema.     Left lower leg: No edema.  Feet:     Left foot:     Toenail Condition: Left toenails are normal.  Lymphadenopathy:     Cervical: No cervical adenopathy.     Upper Body:     Right upper body: No supraclavicular adenopathy.  Left upper body: No supraclavicular adenopathy.  Skin:    General: Skin is warm and dry.     Capillary Refill: Capillary refill takes less than 2 seconds.     Nails: There is no clubbing.  Neurological:     General: No focal deficit present.     Mental Status: She is alert and oriented to person, place, and time.     GCS: GCS eye subscore is 4. GCS verbal subscore is 5. GCS motor subscore is 6.     Sensory: Sensation is intact.     Motor: Motor function is intact.     Coordination: Coordination is intact.     Gait: Gait is intact.     Deep Tendon Reflexes: Reflexes are normal and symmetric.  Psychiatric:        Attention and Perception: Attention normal.        Mood and Affect: Mood is anxious.        Speech: Speech normal.        Behavior: Behavior normal. Behavior is cooperative.         Cognition and Memory: Cognition and memory normal.     Results for orders placed or performed in visit on 01/23/23  CBC with Differential/Platelet  Result Value Ref Range   WBC 6.9 3.4 - 10.8 x10E3/uL   RBC 4.53 3.77 - 5.28 x10E6/uL   Hemoglobin 10.3 (L) 11.1 - 15.9 g/dL   Hematocrit 32.7 (L) 34.0 - 46.6 %   MCV 72 (L) 79 - 97 fL   MCH 22.7 (L) 26.6 - 33.0 pg   MCHC 31.5 31.5 - 35.7 g/dL   RDW 16.0 (H) 11.7 - 15.4 %   Platelets 299 150 - 450 x10E3/uL   Neutrophils 64 Not Estab. %   Lymphs 27 Not Estab. %   Monocytes 7 Not Estab. %   Eos 1 Not Estab. %   Basos 1 Not Estab. %   Neutrophils Absolute 4.5 1.4 - 7.0 x10E3/uL   Lymphocytes Absolute 1.9 0.7 - 3.1 x10E3/uL   Monocytes Absolute 0.5 0.1 - 0.9 x10E3/uL   EOS (ABSOLUTE) 0.1 0.0 - 0.4 x10E3/uL   Basophils Absolute 0.0 0.0 - 0.2 x10E3/uL   Immature Granulocytes 0 Not Estab. %   Immature Grans (Abs) 0.0 0.0 - 0.1 x10E3/uL  Comprehensive metabolic panel  Result Value Ref Range   Glucose 103 (H) 70 - 99 mg/dL   BUN 19 6 - 24 mg/dL   Creatinine, Ser 1.09 (H) 0.57 - 1.00 mg/dL   eGFR 61 >59 mL/min/1.73   BUN/Creatinine Ratio 17 9 - 23   Sodium 142 134 - 144 mmol/L   Potassium 4.6 3.5 - 5.2 mmol/L   Chloride 103 96 - 106 mmol/L   CO2 23 20 - 29 mmol/L   Calcium 10.0 8.7 - 10.2 mg/dL   Total Protein 7.8 6.0 - 8.5 g/dL   Albumin 4.5 3.8 - 4.9 g/dL   Globulin, Total 3.3 1.5 - 4.5 g/dL   Albumin/Globulin Ratio 1.4 1.2 - 2.2   Bilirubin Total 0.3 0.0 - 1.2 mg/dL   Alkaline Phosphatase 82 44 - 121 IU/L   AST 22 0 - 40 IU/L   ALT 19 0 - 32 IU/L  Hemoglobin A1c  Result Value Ref Range   Hgb A1c MFr Bld 5.7 (H) 4.8 - 5.6 %   Est. average glucose Bld gHb Est-mCnc 117 mg/dL  Iron, TIBC and Ferritin Panel  Result Value Ref Range   Total Iron Binding Capacity 256  250 - 450 ug/dL   UIBC 215 131 - 425 ug/dL   Iron 41 27 - 159 ug/dL   Iron Saturation 16 15 - 55 %   Ferritin 318 (H) 15 - 150 ng/mL  TSH  Result Value Ref Range    TSH 2.840 0.450 - 4.500 uIU/mL  VITAMIN D 25 Hydroxy (Vit-D Deficiency, Fractures)  Result Value Ref Range   Vit D, 25-Hydroxy 21.4 (L) 30.0 - 100.0 ng/mL  Lipid panel  Result Value Ref Range   Cholesterol, Total 212 (H) 100 - 199 mg/dL   Triglycerides 56 0 - 149 mg/dL   HDL 87 >39 mg/dL   VLDL Cholesterol Cal 10 5 - 40 mg/dL   LDL Chol Calc (NIH) 115 (H) 0 - 99 mg/dL   Chol/HDL Ratio 2.4 0.0 - 4.4 ratio  Testosterone  Result Value Ref Range   Testosterone <3 (L) 4 - 50 ng/dL  Vitamin B12  Result Value Ref Range   Vitamin B-12 753 232 - 1,245 pg/mL         Assessment & Plan:   Problem List Items Addressed This Visit     Social anxiety disorder    Jessica [name] is currently experiencing a resurgence of stress, anxiety, and depression, which has been exacerbated by a recent stressful job experience. She has a long-standing history of social anxiety and has been out of the workforce for a decade before recently undertaking a new job, which she left after four weeks due to overwhelming stress. Jessica [name] is on Effexor 150mg , which she reports as being ineffective currently, and Trazodone, which aids her sleep. Previous attempts at counseling were not found to be effective by her. Today, she appears anxious but not panicked or in distress. She has no alarm symptoms present. Given that effexor has been effective in the past, we will first try to increase that before considering a medication change.  Plan: - Increase the dosage of Effexor to 225mg  (150mg  + 75mg ) to assess symptom improvement. - Continue with Trazodone for sleep support. - Explore changing counseling approaches, focusing on brain retraining and positive thinking. If you would like a referral, please let me know.  - Reassurance provided about perceived body odor, discussing the psychological aspects of her concern. - Schedule a follow-up appointment in four weeks to evaluate the response to the increased dosage of Effexor and consider  further treatment options if needed.      Relevant Medications   venlafaxine XR (EFFEXOR XR) 75 MG 24 hr capsule   Other Relevant Orders   CBC with Differential/Platelet (Completed)   Comprehensive metabolic panel (Completed)   Hemoglobin A1c (Completed)   Iron, TIBC and Ferritin Panel (Completed)   TSH (Completed)   VITAMIN D 25 Hydroxy (Vit-D Deficiency, Fractures) (Completed)   Lipid panel (Completed)   Major depressive disorder, recurrent episode, moderate (Jessica [name])    Jessica [name] inquires about the possibility of her condition being recognized as a disability and the use of FMLA for work accommodations. We discussed that anxiety and depression are recognized as disabilities under the law, qualifying for accommodations such as FMLA.  Plan: - Bring/send FMLA paperwork to the office for completion.       Relevant Medications   venlafaxine XR (EFFEXOR XR) 75 MG 24 hr capsule   Other Relevant Orders   CBC with Differential/Platelet (Completed)   Comprehensive metabolic panel (Completed)   Hemoglobin A1c (Completed)   Iron, TIBC and Ferritin Panel (Completed)   TSH (Completed)   VITAMIN D 25  Hydroxy (Vit-D Deficiency, Fractures) (Completed)   Lipid panel (Completed)   Perimenopause    Jessica [name] expresses concerns regarding the impact of menopause on her mental health, noting an exacerbation of her anxiety and depression symptoms. She is particularly concerned about the relationship between menopause and sexual desire, reporting a decrease in libido. Also consider a correlation with her mental health status and poor self image as a relative factor for decreased desire for intimacy.  Plan: - Discussed the commonality of emotional difficulties during menopause and the potential for exacerbation of anxiety and depression symptoms. - Will check testosterone levels to evaluate if hormonal changes are contributing to the decreased libido. - Education provided on the variability of menopause onset and  symptoms, advising against the purchase of over-the-counter menopause tests as these are not likely to be helpful.  - Encouraged working on self-image and mental health to reduce the concerns about odor/image to improve desire.       Low libido    See perimenopause.       Relevant Orders   Testosterone (Completed)   Deformity of nail bed    Jessica [name] has observed the development of ridges on her nails, a new occurrence for her. I am concerned about anemia and possible vitamin deficiency given the presence of these findings. Plan: - Lab tests ordered. - Schedule a follow-up appointment in four weeks to review lab results and discuss any necessary supplementation or dietary adjustments.      Relevant Orders   Vitamin B12 (Completed)   Mixed hyperlipidemia - Primary   Relevant Orders   CBC with Differential/Platelet (Completed)   Comprehensive metabolic panel (Completed)   Hemoglobin A1c (Completed)   Iron, TIBC and Ferritin Panel (Completed)   TSH (Completed)   VITAMIN D 25 Hydroxy (Vit-D Deficiency, Fractures) (Completed)   Lipid panel (Completed)   Essential hypertension   Relevant Orders   CBC with Differential/Platelet (Completed)   Comprehensive metabolic panel (Completed)   Hemoglobin A1c (Completed)   Iron, TIBC and Ferritin Panel (Completed)   TSH (Completed)   VITAMIN D 25 Hydroxy (Vit-D Deficiency, Fractures) (Completed)   Lipid panel (Completed)   Obsessive compulsive disorder   Relevant Medications   venlafaxine XR (EFFEXOR XR) 75 MG 24 hr capsule   Other Relevant Orders   CBC with Differential/Platelet (Completed)   Comprehensive metabolic panel (Completed)   Hemoglobin A1c (Completed)   Iron, TIBC and Ferritin Panel (Completed)   TSH (Completed)   VITAMIN D 25 Hydroxy (Vit-D Deficiency, Fractures) (Completed)   Lipid panel (Completed)       Follow up plan: Return in about 4 weeks (around 02/20/2023) for Virtual Mood (30).  NEXT PREVENTATIVE PHYSICAL DUE IN  1 YEAR.  PATIENT COUNSELING PROVIDED FOR ALL ADULT PATIENTS:  Consume a well balanced diet low in saturated fats, cholesterol, and moderation in carbohydrates.   This can be as simple as monitoring portion sizes and cutting back on sugary beverages such as soda and juice to start with.    Daily water consumption of at least 64 ounces.  Physical activity at least 180 minutes per week, if just starting out.   This can be as simple as taking the stairs instead of the elevator and walking 2-3 laps around the office  purposefully every day.   STD protection, partner selection, and regular testing if high risk.  Limited consumption of alcoholic beverages if alcohol is consumed.  For women, I recommend no more than 7 alcoholic beverages per week, spread out throughout  the week.  Avoid "binge" drinking or consuming large quantities of alcohol in one setting.   Please let me know if you feel you may need help with reduction or quitting alcohol consumption.   Avoidance of nicotine, if used.  Please let me know if you feel you may need help with reduction or quitting nicotine use.   Daily mental health attention.  This can be in the form of 5 minute daily meditation, prayer, journaling, yoga, reflection, etc.   Purposeful attention to your emotions and mental state can significantly improve your overall wellbeing and Health.  Please know that I am here to help you with all of your health care goals and am happy to work with you to find a solution that works best for you.  The greatest advice I have received with any changes in life are to take it one step at a time, that even means if all you can focus on is the next 60 seconds, then do that and celebrate your victories.  With any changes in life, you will have set backs, and that is OK. The important thing to remember is, if you have a set back, it is not a failure, it is an opportunity to try again!  Health Maintenance Recommendations Screening  Testing Mammogram Every 1 -2 years based on history and risk factors Starting at age 51 Pap Smear Ages 21-39 every 3 years Ages 62-65 every 5 years with HPV testing More frequent testing may be required based on results and history Colon Cancer Screening Every 1-10 years based on test performed, risk factors, and history Starting at age 51 Bone Density Screening Every 2-10 years based on history Starting at age 46 for women Recommendations for men differ based on medication usage, history, and risk factors AAA Screening One time ultrasound Men 23-50 years old who have every smoked Lung Cancer Screening Low Dose Lung CT every 12 months Age 88-80 years with a 30 pack-year smoking history who still smoke or who have quit within the last 15 years  Screening Labs Routine  Labs: Complete Blood Count (CBC), Complete Metabolic Panel (CMP), Cholesterol (Lipid Panel) Every 6-12 months based on history and medications May be recommended more frequently based on current conditions or previous results Hemoglobin A1c Lab Every 3-12 months based on history and previous results Starting at age 19 or earlier with diagnosis of diabetes, high cholesterol, BMI >26, and/or risk factors Frequent monitoring for patients with diabetes to ensure blood sugar control Thyroid Panel (TSH w/ T3 & T4) Every 6 months based on history, symptoms, and risk factors May be repeated more often if on medication HIV One time testing for all patients 40 and older May be repeated more frequently for patients with increased risk factors or exposure Hepatitis C One time testing for all patients 29 and older May be repeated more frequently for patients with increased risk factors or exposure Gonorrhea, Chlamydia Every 12 months for all sexually active persons 13-24 years Additional monitoring may be recommended for those who are considered high risk or who have symptoms PSA Men 51-13 years old with risk  factors Additional screening may be recommended from age 57-69 based on risk factors, symptoms, and history  Vaccine Recommendations Tetanus Booster All adults every 10 years Flu Vaccine All patients 6 months and older every year COVID Vaccine All patients 12 years and older Initial dosing with booster May recommend additional booster based on age and health history HPV Vaccine 2 doses all patients  age 27-26 Dosing may be considered for patients over 26 Shingles Vaccine (Shingrix) 2 doses all adults 25 years and older Pneumonia (Pneumovax 23) All adults 30 years and older May recommend earlier dosing based on health history Pneumonia (Prevnar 13) All adults 49 years and older Dosed 1 year after Pneumovax 23  Additional Screening, Testing, and Vaccinations may be recommended on an individualized basis based on family history, health history, risk factors, and/or exposure.

## 2023-01-23 NOTE — Patient Instructions (Addendum)
I want you to work on three good things and positive thinking.

## 2023-01-24 LAB — COMPREHENSIVE METABOLIC PANEL
ALT: 19 IU/L (ref 0–32)
AST: 22 IU/L (ref 0–40)
Albumin/Globulin Ratio: 1.4 (ref 1.2–2.2)
Albumin: 4.5 g/dL (ref 3.8–4.9)
Alkaline Phosphatase: 82 IU/L (ref 44–121)
BUN/Creatinine Ratio: 17 (ref 9–23)
BUN: 19 mg/dL (ref 6–24)
Bilirubin Total: 0.3 mg/dL (ref 0.0–1.2)
CO2: 23 mmol/L (ref 20–29)
Calcium: 10 mg/dL (ref 8.7–10.2)
Chloride: 103 mmol/L (ref 96–106)
Creatinine, Ser: 1.09 mg/dL — ABNORMAL HIGH (ref 0.57–1.00)
Globulin, Total: 3.3 g/dL (ref 1.5–4.5)
Glucose: 103 mg/dL — ABNORMAL HIGH (ref 70–99)
Potassium: 4.6 mmol/L (ref 3.5–5.2)
Sodium: 142 mmol/L (ref 134–144)
Total Protein: 7.8 g/dL (ref 6.0–8.5)
eGFR: 61 mL/min/{1.73_m2} (ref >59)

## 2023-01-24 LAB — TSH: TSH: 2.84 u[IU]/mL (ref 0.450–4.500)

## 2023-01-24 LAB — IRON,TIBC AND FERRITIN PANEL
Ferritin: 318 ng/mL — ABNORMAL HIGH (ref 15–150)
Iron Saturation: 16 % (ref 15–55)
Iron: 41 ug/dL (ref 27–159)
Total Iron Binding Capacity: 256 ug/dL (ref 250–450)
UIBC: 215 ug/dL (ref 131–425)

## 2023-01-24 LAB — CBC WITH DIFFERENTIAL/PLATELET
Basophils Absolute: 0 10*3/uL (ref 0.0–0.2)
Basos: 1 %
EOS (ABSOLUTE): 0.1 10*3/uL (ref 0.0–0.4)
Eos: 1 %
Hematocrit: 32.7 % — ABNORMAL LOW (ref 34.0–46.6)
Hemoglobin: 10.3 g/dL — ABNORMAL LOW (ref 11.1–15.9)
Immature Grans (Abs): 0 10*3/uL (ref 0.0–0.1)
Immature Granulocytes: 0 %
Lymphocytes Absolute: 1.9 10*3/uL (ref 0.7–3.1)
Lymphs: 27 %
MCH: 22.7 pg — ABNORMAL LOW (ref 26.6–33.0)
MCHC: 31.5 g/dL (ref 31.5–35.7)
MCV: 72 fL — ABNORMAL LOW (ref 79–97)
Monocytes Absolute: 0.5 10*3/uL (ref 0.1–0.9)
Monocytes: 7 %
Neutrophils Absolute: 4.5 10*3/uL (ref 1.4–7.0)
Neutrophils: 64 %
Platelets: 299 10*3/uL (ref 150–450)
RBC: 4.53 x10E6/uL (ref 3.77–5.28)
RDW: 16 % — ABNORMAL HIGH (ref 11.7–15.4)
WBC: 6.9 10*3/uL (ref 3.4–10.8)

## 2023-01-24 LAB — VITAMIN B12: Vitamin B-12: 753 pg/mL (ref 232–1245)

## 2023-01-24 LAB — LIPID PANEL
Chol/HDL Ratio: 2.4 ratio (ref 0.0–4.4)
Cholesterol, Total: 212 mg/dL — ABNORMAL HIGH (ref 100–199)
HDL: 87 mg/dL (ref >39)
LDL Chol Calc (NIH): 115 mg/dL — ABNORMAL HIGH (ref 0–99)
Triglycerides: 56 mg/dL (ref 0–149)
VLDL Cholesterol Cal: 10 mg/dL (ref 5–40)

## 2023-01-24 LAB — HEMOGLOBIN A1C
Est. average glucose Bld gHb Est-mCnc: 117 mg/dL
Hgb A1c MFr Bld: 5.7 % — ABNORMAL HIGH (ref 4.8–5.6)

## 2023-01-24 LAB — VITAMIN D 25 HYDROXY (VIT D DEFICIENCY, FRACTURES): Vit D, 25-Hydroxy: 21.4 ng/mL — ABNORMAL LOW (ref 30.0–100.0)

## 2023-01-24 LAB — TESTOSTERONE: Testosterone: <3 ng/dL — ABNORMAL LOW (ref 4–50)

## 2023-01-26 ENCOUNTER — Encounter: Payer: Self-pay | Admitting: Nurse Practitioner

## 2023-01-26 ENCOUNTER — Encounter: Payer: Self-pay | Admitting: Internal Medicine

## 2023-01-26 DIAGNOSIS — R6882 Decreased libido: Secondary | ICD-10-CM | POA: Insufficient documentation

## 2023-01-26 DIAGNOSIS — L608 Other nail disorders: Secondary | ICD-10-CM | POA: Insufficient documentation

## 2023-01-26 DIAGNOSIS — N951 Menopausal and female climacteric states: Secondary | ICD-10-CM | POA: Insufficient documentation

## 2023-01-26 NOTE — Assessment & Plan Note (Signed)
Jessica [name] is currently experiencing a resurgence of stress, anxiety, and depression, which has been exacerbated by a recent stressful job experience. She has a long-standing history of social anxiety and has been out of the workforce for a decade before recently undertaking a new job, which she left after four weeks due to overwhelming stress. Jessica [name] is on Effexor 150mg , which she reports as being ineffective currently, and Trazodone, which aids her sleep. Previous attempts at counseling were not found to be effective by her. Today, she appears anxious but not panicked or in distress. She has no alarm symptoms present. Given that effexor has been effective in the past, we will first try to increase that before considering a medication change.  Plan: - Increase the dosage of Effexor to 225mg  (150mg  + 75mg ) to assess symptom improvement. - Continue with Trazodone for sleep support. - Explore changing counseling approaches, focusing on brain retraining and positive thinking. If you would like a referral, please let me know.  - Reassurance provided about perceived body odor, discussing the psychological aspects of her concern. - Schedule a follow-up appointment in four weeks to evaluate the response to the increased dosage of Effexor and consider further treatment options if needed.

## 2023-01-26 NOTE — Assessment & Plan Note (Signed)
Jessica [name] has observed the development of ridges on her nails, a new occurrence for her. I am concerned about anemia and possible vitamin deficiency given the presence of these findings. Plan: - Lab tests ordered. - Schedule a follow-up appointment in four weeks to review lab results and discuss any necessary supplementation or dietary adjustments.

## 2023-01-26 NOTE — Assessment & Plan Note (Signed)
Sury inquires about the possibility of her condition being recognized as a disability and the use of FMLA for work accommodations. We discussed that anxiety and depression are recognized as disabilities under the law, qualifying for accommodations such as FMLA.  Plan: - Bring/send FMLA paperwork to the office for completion.

## 2023-01-26 NOTE — Assessment & Plan Note (Signed)
See perimenopause.

## 2023-01-26 NOTE — Assessment & Plan Note (Signed)
Jessica [name] expresses concerns regarding the impact of menopause on her mental health, noting an exacerbation of her anxiety and depression symptoms. She is particularly concerned about the relationship between menopause and sexual desire, reporting a decrease in libido. Also consider a correlation with her mental health status and poor self image as a relative factor for decreased desire for intimacy.  Plan: - Discussed the commonality of emotional difficulties during menopause and the potential for exacerbation of anxiety and depression symptoms. - Will check testosterone levels to evaluate if hormonal changes are contributing to the decreased libido. - Education provided on the variability of menopause onset and symptoms, advising against the purchase of over-the-counter menopause tests as these are not likely to be helpful.  - Encouraged working on self-image and mental health to reduce the concerns about odor/image to improve desire.

## 2023-01-30 ENCOUNTER — Telehealth: Payer: Self-pay | Admitting: Internal Medicine

## 2023-01-30 ENCOUNTER — Encounter: Payer: Self-pay | Admitting: Medical Oncology

## 2023-01-30 ENCOUNTER — Telehealth: Payer: Self-pay | Admitting: Nurse Practitioner

## 2023-01-30 ENCOUNTER — Other Ambulatory Visit: Payer: Self-pay | Admitting: Nurse Practitioner

## 2023-01-30 DIAGNOSIS — Z1231 Encounter for screening mammogram for malignant neoplasm of breast: Secondary | ICD-10-CM

## 2023-01-30 NOTE — Telephone Encounter (Signed)
Yoli called in wondering if Vitamin D was sent int for yet. I didn't see it in her list but she says she is ready and prefers  [id number] - [place], [place]

## 2023-01-30 NOTE — Telephone Encounter (Signed)
Per 3/19 IB reached out to patient to schedule , patient aware of date and time of appointment.

## 2023-01-31 ENCOUNTER — Ambulatory Visit
Admission: RE | Admit: 2023-01-31 | Discharge: 2023-01-31 | Disposition: A | Discharge status: Home / Self Care | Payer: BC Managed Care – PPO | Source: Ambulatory Visit | Attending: Nurse Practitioner | Admitting: Nurse Practitioner

## 2023-01-31 ENCOUNTER — Other Ambulatory Visit: Payer: Self-pay

## 2023-01-31 DIAGNOSIS — Z1231 Encounter for screening mammogram for malignant neoplasm of breast: Secondary | ICD-10-CM | POA: Diagnosis not present

## 2023-01-31 MED ORDER — VITAMIN D (ERGOCALCIFEROL) 1.25 MG (50000 UNIT) PO CAPS: 50000.0000 [IU] | ORAL_CAPSULE | ORAL | 0 refills | Status: DC

## 2023-02-06 ENCOUNTER — Other Ambulatory Visit: Payer: Self-pay | Admitting: Medical

## 2023-02-06 DIAGNOSIS — F5101 Primary insomnia: Secondary | ICD-10-CM

## 2023-02-06 DIAGNOSIS — F4010 Social phobia, unspecified: Secondary | ICD-10-CM

## 2023-02-06 DIAGNOSIS — E782 Mixed hyperlipidemia: Secondary | ICD-10-CM

## 2023-02-06 DIAGNOSIS — I10 Essential (primary) hypertension: Secondary | ICD-10-CM

## 2023-02-06 DIAGNOSIS — F331 Major depressive disorder, recurrent, moderate: Secondary | ICD-10-CM

## 2023-02-19 ENCOUNTER — Ambulatory Visit: Payer: BC Managed Care – PPO | Admitting: Nurse Practitioner

## 2023-02-19 ENCOUNTER — Encounter: Payer: Self-pay | Admitting: Nurse Practitioner

## 2023-02-19 VITALS — BP 116/78 | HR 100 | Wt 158.8 lb

## 2023-02-19 DIAGNOSIS — F331 Major depressive disorder, recurrent, moderate: Secondary | ICD-10-CM

## 2023-02-19 DIAGNOSIS — F4010 Social phobia, unspecified: Secondary | ICD-10-CM | POA: Diagnosis not present

## 2023-02-19 DIAGNOSIS — E559 Vitamin D deficiency, unspecified: Secondary | ICD-10-CM | POA: Diagnosis not present

## 2023-02-19 MED ORDER — BUSPIRONE HCL 5 MG PO TABS: ORAL_TABLET | ORAL | 1 refills | Status: DC

## 2023-02-19 MED ORDER — ALPRAZOLAM 0.25 MG PO TABS
ORAL_TABLET | ORAL | 1 refills | Status: DC
Start: 2023-02-19 — End: 2023-06-14

## 2023-02-19 NOTE — Assessment & Plan Note (Signed)
The patient continues to experience anxiety and depression despite current treatment with venlafaxine and recent dose increase. She reports headaches were a side effect when the dose was increased. She does not feel the new dosage has made any change.  Plan: - Gradually taper venlafaxine, taking it every other day for one week, then every three days for another week. Monitor symptoms and revert to the previous dose for another week if symptoms worsen. - Initiate BuSpar (buspirone) for anxiety management, with instructions to trial at home. If effective, continue with BuSpar; otherwise, use Xanax (alprazolam) as needed. Discontinue BuSpar if ineffective after several attempts and solely rely on Xanax. Promptly report to the provider if Xanax is ineffective. - Consider psychiatric consultation if there is no improvement with the adjusted treatment plan. 

## 2023-02-19 NOTE — Assessment & Plan Note (Signed)
The patient is on a regimen of 50000iU weekly vitamin D supplementation for recent low levels. Plan: - Re-evaluate vitamin D levels after three months of supplementation. If levels are satisfactory, transition to maintenance with over-the-counter supplements. Be vigilant for symptoms such as muscle or bone pain that could indicate persistently low vitamin D levels.

## 2023-02-19 NOTE — Progress Notes (Signed)
[name], DNP, AGNP-c [facility]  [address] [place], [place] [address] [phone]  ESTABLISHED PATIENT- Chronic Health and/or Follow-Up Visit  Blood pressure 116/78, pulse 100, weight 158 lb 12.8 oz (72 kg), last menstrual period 01/15/2023.    Jessica [name] is a 52 y.o. year old female presenting today for evaluation and management of chronic conditions.   Jessica [name] presents today with concerns regarding her current medication regimen and its effectiveness in managing her symptoms. She reports that despite a recent increase in her venlafaxine dosage, she has not experienced an improvement in her condition and suspects that the medication may be contributing to her headaches.  The patient describes her anxiety as "functional," indicating that while she is able to go out and perform necessary activities, it requires extensive planning due to the impact of her anxiety on her daily life. Jessica [name] notes that her ability to focus is predominantly challenged when she is required to be around people, whereas at home, she finds it easier to relax. She credits Trazodone with aiding her ability to stay calm and sleep.  During the visit, Jessica [name] expresses concerns about the hereditary aspects of her anxiety and depression, suggesting she is seeking a deeper understanding of the underlying causes. She is open to exploring new pharmacological options to better manage her anxiety and has expressed specific interest in Buspar and Xanax for situations that necessitate her going out.  Jessica [name] confirms adherence to her current medication, taking 150mg  of venlafaxine daily, and discusses her vitamin D supplementation, recognizing its significance for bone health. She consents to a plan to adjust her medication regimen, which includes the introduction of Buspar and Xanax, with the goal of achieving a more effective balance in managing her anxiety symptoms.    All ROS negative with  exception of what is listed above.   PHYSICAL EXAM Physical Exam Vitals and nursing note reviewed.  Constitutional:      Appearance: Normal appearance.  HENT:     Head: Normocephalic.  Eyes:     Pupils: Pupils are equal, round, and reactive to light.  Cardiovascular:     Rate and Rhythm: Normal rate and regular rhythm.     Pulses: Normal pulses.     Heart sounds: Normal heart sounds.  Pulmonary:     Effort: Pulmonary effort is normal.     Breath sounds: Normal breath sounds.  Musculoskeletal:        General: Normal range of motion.     Cervical back: Normal range of motion.  Skin:    General: Skin is warm and dry.     Capillary Refill: Capillary refill takes less than 2 seconds.  Neurological:     General: No focal deficit present.     Mental Status: She is alert and oriented to person, place, and time.  Psychiatric:        Mood and Affect: Mood normal.     PLAN Problem List Items Addressed This Visit     Social anxiety disorder - Primary    The patient continues to experience anxiety and depression despite current treatment with venlafaxine and recent dose increase. She reports headaches were a side effect when the dose was increased. She does not feel the new dosage has made any change.  Plan: - Gradually taper venlafaxine, taking it every other day for one week, then every three days for another week. Monitor symptoms and revert to the previous dose for another week if symptoms worsen. - Initiate BuSpar (buspirone) for anxiety  management, with instructions to trial at home. If effective, continue with BuSpar; otherwise, use Xanax (alprazolam) as needed. Discontinue BuSpar if ineffective after several attempts and solely rely on Xanax. Promptly report to the provider if Xanax is ineffective. - Consider psychiatric consultation if there is no improvement with the adjusted treatment plan.      Relevant Medications   busPIRone (BUSPAR) 5 MG tablet   ALPRAZolam (XANAX) 0.25  MG tablet   Major depressive disorder, recurrent episode, moderate    The patient continues to experience anxiety and depression despite current treatment with venlafaxine and recent dose increase. She reports headaches were a side effect when the dose was increased. She does not feel the new dosage has made any change.  Plan: - Gradually taper venlafaxine, taking it every other day for one week, then every three days for another week. Monitor symptoms and revert to the previous dose for another week if symptoms worsen. - Initiate BuSpar (buspirone) for anxiety management, with instructions to trial at home. If effective, continue with BuSpar; otherwise, use Xanax (alprazolam) as needed. Discontinue BuSpar if ineffective after several attempts and solely rely on Xanax. Promptly report to the provider if Xanax is ineffective. - Consider psychiatric consultation if there is no improvement with the adjusted treatment plan.      Relevant Medications   busPIRone (BUSPAR) 5 MG tablet   ALPRAZolam (XANAX) 0.25 MG tablet   Vitamin D deficiency    The patient is on a regimen of 50000iU weekly vitamin D supplementation for recent low levels. Plan: - Re-evaluate vitamin D levels after three months of supplementation. If levels are satisfactory, transition to maintenance with over-the-counter supplements. Be vigilant for symptoms such as muscle or bone pain that could indicate persistently low vitamin D levels.       Return in about 6 weeks (around 04/02/2023) for Med Check.   [name], DNP, AGNP-c 02/19/2023 11:20 AM

## 2023-02-19 NOTE — Assessment & Plan Note (Signed)
The patient continues to experience anxiety and depression despite current treatment with venlafaxine and recent dose increase. She reports headaches were a side effect when the dose was increased. She does not feel the new dosage has made any change.  Plan: - Gradually taper venlafaxine, taking it every other day for one week, then every three days for another week. Monitor symptoms and revert to the previous dose for another week if symptoms worsen. - Initiate BuSpar (buspirone) for anxiety management, with instructions to trial at home. If effective, continue with BuSpar; otherwise, use Xanax (alprazolam) as needed. Discontinue BuSpar if ineffective after several attempts and solely rely on Xanax. Promptly report to the provider if Xanax is ineffective. - Consider psychiatric consultation if there is no improvement with the adjusted treatment plan.

## 2023-02-19 NOTE — Patient Instructions (Addendum)
I want you to taper down on the venlafaxine by taking the 75mg  dose every other day for the next 7 days then every 3 days for 7 more days then stop.   I have sent in two different Buspar (buspirone) and Xanax (alprazolam). Start with the Buspar and see if that helps with the anxiety you experience as you go out. If you don't have relief with the Buspar, you can then take 1/2 to 1 xanax.   There may be times the Buspar is enough and other times where you need the Xanax. It is ok to go back and forth between both, if you feel the Buspar is helpful sometimes. If you feel the Buspar is never helpful, you can go straight to the Xanax. If this doesn't seem to help, please let me know.

## 2023-03-01 ENCOUNTER — Telehealth: Payer: Self-pay | Admitting: Nurse Practitioner

## 2023-03-01 NOTE — Telephone Encounter (Signed)
[facility]cl Please send to the  [id number] [place],  - [address]

## 2023-03-15 ENCOUNTER — Inpatient Hospital Stay: Payer: BC Managed Care – PPO | Attending: Internal Medicine | Admitting: Internal Medicine

## 2023-03-15 ENCOUNTER — Inpatient Hospital Stay: Payer: BC Managed Care – PPO

## 2023-03-15 VITALS — BP 122/77 | HR 101 | Temp 97.5°F | Resp 17 | Wt 160.4 lb

## 2023-03-15 DIAGNOSIS — D500 Iron deficiency anemia secondary to blood loss (chronic): Secondary | ICD-10-CM | POA: Insufficient documentation

## 2023-03-15 DIAGNOSIS — N920 Excessive and frequent menstruation with regular cycle: Secondary | ICD-10-CM | POA: Diagnosis not present

## 2023-03-15 DIAGNOSIS — D563 Thalassemia minor: Secondary | ICD-10-CM

## 2023-03-15 DIAGNOSIS — D472 Monoclonal gammopathy: Secondary | ICD-10-CM | POA: Insufficient documentation

## 2023-03-15 DIAGNOSIS — Z79899 Other long term (current) drug therapy: Secondary | ICD-10-CM | POA: Insufficient documentation

## 2023-03-15 LAB — CBC WITH DIFFERENTIAL (CANCER CENTER ONLY)
Abs Immature Granulocytes: 0.01 10*3/uL (ref 0.00–0.07)
Basophils Absolute: 0.1 10*3/uL (ref 0.0–0.1)
Basophils Relative: 1 %
Eosinophils Absolute: 0.1 10*3/uL (ref 0.0–0.5)
Eosinophils Relative: 1 %
HCT: 34.4 % — ABNORMAL LOW (ref 36.0–46.0)
Hemoglobin: 10.5 g/dL — ABNORMAL LOW (ref 12.0–15.0)
Immature Granulocytes: 0 %
Lymphocytes Relative: 32 %
Lymphs Abs: 1.7 10*3/uL (ref 0.7–4.0)
MCH: 22.6 pg — ABNORMAL LOW (ref 26.0–34.0)
MCHC: 30.5 g/dL (ref 30.0–36.0)
MCV: 74 fL — ABNORMAL LOW (ref 80.0–100.0)
Monocytes Absolute: 0.4 10*3/uL (ref 0.1–1.0)
Monocytes Relative: 8 %
Neutro Abs: 3.1 10*3/uL (ref 1.7–7.7)
Neutrophils Relative %: 58 %
Platelet Count: 254 10*3/uL (ref 150–400)
RBC: 4.65 MIL/uL (ref 3.87–5.11)
RDW: 15.6 % — ABNORMAL HIGH (ref 11.5–15.5)
WBC Count: 5.4 10*3/uL (ref 4.0–10.5)
nRBC: 0 % (ref 0.0–0.2)

## 2023-03-15 LAB — IRON AND IRON BINDING CAPACITY (CC-WL,HP ONLY)
Iron: 88 ug/dL (ref 28–170)
Saturation Ratios: 34 % — ABNORMAL HIGH (ref 10.4–31.8)
TIBC: 260 ug/dL (ref 250–450)
UIBC: 172 ug/dL (ref 148–442)

## 2023-03-15 LAB — FERRITIN: Ferritin: 204 ng/mL (ref 11–307)

## 2023-03-15 NOTE — Progress Notes (Signed)
[facility] [phone]   [phone]  OFFICE PROGRESS NOTE  [name], NP [address] [place] [address]  DIAGNOSIS:  1) Microcytic, hypochromic anemia secondary to iron deficiency secondary to menorrhagia. 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  PRIOR THERAPY: Feraheme infusion on as-needed basis  CURRENT THERAPY: Over-the-counter ferrous sulfate with vitamin C  INTERVAL HISTORY: Jessica [name] 52 y.o. female returns to clinic today for follow-up visit accompanied by her husband.  The patient is feeling fine today with no concerning complaints.  She denied having any current chest pain, shortness of breath, cough or hemoptysis.  She has no nausea, vomiting, diarrhea or constipation.  She has no headache or visual changes.  She has no recent weight loss or night sweats.  She had blood work performed 6 weeks ago that showed persistent anemia but no evidence of iron deficiency.   MEDICAL HISTORY: Past Medical History:  Diagnosis Date   Allergy    seasonal   Anemia    Anxiety    Depression    HYPERCHOLESTEROLEMIA 10/07/2007   Qualifier: Diagnosis of  By: Cheri Guppy     Hyperlipidemia    MGUS (monoclonal gammopathy of unknown significance)     ALLERGIES:  is allergic to sulfonamide derivatives.  MEDICATIONS:  Current Outpatient Medications  Medication Sig Dispense Refill   ALPRAZolam (XANAX) 0.25 MG tablet Take 0.125 - 0.25mg  (1/2 to 1 tab) for anxiety up to twice a day as needed, if Buspar ineffective. 30 tablet 1   atorvastatin (LIPITOR) 40 MG tablet TAKE 1 TABLET BY MOUTH EVERY DAY 90 tablet 1   busPIRone (BUSPAR) 5 MG tablet Take 5mg  (1 tablet)  as needed for anxiety up to 3 times in 1 day. 45 tablet 1   hydrochlorothiazide (HYDRODIURIL) 25 MG tablet TAKE 1 TABLET BY MOUTH EVERY DAY 90 tablet 1   Omega-3 Fatty Acids (OMEGA-3 EPA FISH OIL PO) Take 1,000 mg by  mouth in the morning and at bedtime.     traZODone (DESYREL) 50 MG tablet TAKE 0.5-1 TABLETS BY MOUTH AT BEDTIME AS NEEDED FOR SLEEP. 90 tablet 1   venlafaxine XR (EFFEXOR-XR) 150 MG 24 hr capsule TAKE 1 CAPSULE BY MOUTH DAILY WITH BREAKFAST. 90 capsule 1   Vitamin D, Ergocalciferol, (DRISDOL) 1.25 MG (50000 UNIT) CAPS capsule Take 1 capsule (50,000 Units total) by mouth every 7 (seven) days. 12 capsule 0   No current facility-administered medications for this visit.    SURGICAL HISTORY:  Past Surgical History:  Procedure Laterality Date   TUBAL LIGATION      REVIEW OF SYSTEMS:  A comprehensive review of systems was negative.   PHYSICAL EXAMINATION: General appearance: alert, cooperative, and no distress Head: Normocephalic, without obvious abnormality, atraumatic Neck: no adenopathy, no JVD, supple, symmetrical, trachea midline, and thyroid not enlarged, symmetric, no tenderness/mass/nodules Lymph nodes: Cervical, supraclavicular, and axillary nodes normal. Resp: clear to auscultation bilaterally Back: symmetric, no curvature. ROM normal. No CVA tenderness. Cardio: regular rate and rhythm, S1, S2 normal, no murmur, click, rub or gallop GI: soft, non-tender; bowel sounds normal; no masses,  no organomegaly Extremities: extremities normal, atraumatic, no cyanosis or edema  ECOG PERFORMANCE STATUS: 1 - Symptomatic but completely ambulatory  Blood pressure 122/77, pulse (!) 101, temperature (!) 97.5 F (36.4 C), resp. rate 17, weight 160 lb 6.4 oz (72.8 kg), SpO2 98 %.  LABORATORY DATA: Lab Results  Component Value Date   WBC  6.9 01/23/2023   HGB 10.3 (L) 01/23/2023   HCT 32.7 (L) 01/23/2023   MCV 72 (L) 01/23/2023   PLT 299 01/23/2023      Chemistry      Component Value Date/Time   NA 142 01/23/2023 1059   K 4.6 01/23/2023 1059   CL 103 01/23/2023 1059   CO2 23 01/23/2023 1059   BUN 19 01/23/2023 1059   CREATININE 1.09 (H) 01/23/2023 1059   CREATININE 1.05 (H)  03/02/2020 1020   CREATININE 0.98 09/10/2017 1333      Component Value Date/Time   CALCIUM 10.0 01/23/2023 1059   ALKPHOS 82 01/23/2023 1059   AST 22 01/23/2023 1059   AST 14 (L) 03/02/2020 1020   ALT 19 01/23/2023 1059   ALT 10 03/02/2020 1020   BILITOT 0.3 01/23/2023 1059   BILITOT 0.3 03/02/2020 1020       RADIOGRAPHIC STUDIES: No results found.  ASSESSMENT AND PLAN: This is a very pleasant 52 years old African-American female presented for evaluation of microcytic hypochromic anemia likely secondary to beta thalassemia minor as well as iron deficiency.   She was previously treated with Feraheme infusion.   She is currently on over-the-counter ferrous sulfate with vitamin C and tolerating it fairly well.  Her last blood work 6 weeks ago showed persistent anemia but no evidence of iron deficiency. I recommended for the patient to have repeat blood work today including CBC, iron study and ferritin. I will see her back for follow-up visit in 6 months for evaluation with repeat blood work.  She was advised to continue with the oral iron tablet for now. The patient was advised to call immediately if she has any other concerning symptoms in the interval. The patient voices understanding of current disease status and treatment options and is in agreement with the current care plan.  All questions were answered. The patient knows to call the clinic with any problems, questions or concerns. We can certainly see the patient much sooner if necessary.  Disclaimer: This note was dictated with voice recognition software. Similar sounding words can inadvertently be transcribed and may not be corrected upon review.

## 2023-04-02 ENCOUNTER — Telehealth: Payer: BC Managed Care – PPO | Admitting: Nurse Practitioner

## 2023-04-02 ENCOUNTER — Encounter: Payer: Self-pay | Admitting: Nurse Practitioner

## 2023-04-02 VITALS — Wt 160.0 lb

## 2023-04-02 DIAGNOSIS — F4010 Social phobia, unspecified: Secondary | ICD-10-CM | POA: Diagnosis not present

## 2023-04-02 DIAGNOSIS — M545 Low back pain, unspecified: Secondary | ICD-10-CM | POA: Insufficient documentation

## 2023-04-02 MED ORDER — CYCLOBENZAPRINE HCL 5 MG PO TABS
5.0000 mg | ORAL_TABLET | Freq: Three times a day (TID) | ORAL | 1 refills | Status: DC | PRN
Start: 2023-04-02 — End: 2024-01-29

## 2023-04-02 NOTE — Progress Notes (Signed)
Virtual Visit Encounter mychart visit.   I connected with  Jessica [name] on 04/02/23 at 10:30 AM EDT by secure video and audio telemedicine application. I verified that I am speaking with the correct person using two identifiers.   I introduced myself as a Publishing rights manager with the practice. The limitations of evaluation and management by telemedicine discussed with the patient and the availability of in person appointments. The patient expressed verbal understanding and consent to proceed.  Participating parties in this visit include: Myself and patient  The patient is: Patient Location: Home I am: Provider Location: Office/Clinic Subjective:    CC and HPI: Jessica [name] is a 52 y.o. year old female presenting for follow up of anxiety. Patient reports the following:  Biancia tells me that her social anxiety symptoms have improved significantly with the addition of xanax as needed. She did not see a difference with the buspar, but feels the xanax has been particularly helpful when she has to go out of the house or has a panic attack at home. She reports she is not using the medication daily, but only as needed. She would like to continue on this.   She also reports pain in her lumbar region that started about 4 weeks ago. She has been using ice, heat, and NSADs for pain management, but the pain persists. She feels that she may have pulled a muscle while lifting her granddaughter from her bouncy seat. She has no radiation or saddle symptoms present.   Past medical history, Surgical history, Family history not pertinant except as noted below, Social history, Allergies, and medications have been entered into the medical record, reviewed, and corrections made.   Review of Systems:  All review of systems negative except what is listed in the HPI  Objective:    Alert and oriented x 4 Speaking in clear sentences with no shortness of breath. No distress.  Impression and Recommendations:     Problem List Items Addressed This Visit     Social anxiety disorder - Primary    Significant improvement of social anxiety symptoms with PRN use of xanax. Unfortunately, buspar was ineffective for her. She is using the medication only as needed and not daily.  Plan: - continue as needed use of xanax for social anxiety symptom management - if finding yourself needing daily dosing, please let me know and we can try to add something for daily management.       Pain in left lumbar region of back    Left lower back pain for the past 4 weeks with little improvement with NSAIDs, ice, and heat therapy.  No alarm symptoms are present at this time.  The patient does feel that the symptoms are primarily musculoskeletal in nature.  We discussed the option of imaging versus conservative care for another couple of weeks.  I will go ahead and send in the order for x-ray that she can have completed at any point if she feels the symptoms are progressing or not improving.  For now okay to continue with the Aleve daily as needed and will add Flexeril as needed and stretching exercises.  If no improvement of symptoms recommend evaluation with orthopedics.      Relevant Medications   cyclobenzaprine (FLEXERIL) 5 MG tablet   Other Relevant Orders   DG Lumbar Spine 2-3 Views    orders and follow up as documented in EMR I discussed the assessment and treatment plan with the patient. The patient was provided an opportunity to  ask questions and all were answered. The patient agreed with the plan and demonstrated an understanding of the instructions.   The patient was advised to call back or seek an in-person evaluation if the symptoms worsen or if the condition fails to improve as anticipated.  Follow-Up: in 6 months  I provided 18 minutes of non-face-to-face interaction with this non face-to-face encounter including intake, same-day documentation, and chart review.   [name], NP , DNP, AGNP-c [facility]

## 2023-04-02 NOTE — Assessment & Plan Note (Signed)
Significant improvement of social anxiety symptoms with PRN use of xanax. Unfortunately, buspar was ineffective for her. She is using the medication only as needed and not daily.  Plan: - continue as needed use of xanax for social anxiety symptom management - if finding yourself needing daily dosing, please let me know and we can try to add something for daily management.

## 2023-04-02 NOTE — Assessment & Plan Note (Signed)
Left lower back pain for the past 4 weeks with little improvement with NSAIDs, ice, and heat therapy.  No alarm symptoms are present at this time.  The patient does feel that the symptoms are primarily musculoskeletal in nature.  We discussed the option of imaging versus conservative care for another couple of weeks.  I will go ahead and send in the order for x-ray that she can have completed at any point if she feels the symptoms are progressing or not improving.  For now okay to continue with the Aleve daily as needed and will add Flexeril as needed and stretching exercises.  If no improvement of symptoms recommend evaluation with orthopedics.

## 2023-04-02 NOTE — Patient Instructions (Addendum)
You can have the x-ray completed at [address] at [facility]. You can just walk in for this.   Low Back Sprain or Strain Rehab Ask your health care provider which exercises are safe for you. Do exercises exactly as told by your health care provider and adjust them as directed. It is normal to feel mild stretching, pulling, tightness, or discomfort as you do these exercises. Stop right away if you feel sudden pain or your pain gets worse. Do not begin these exercises until told by your health care provider. Stretching and range-of-motion exercises These exercises warm up your muscles and joints and improve the movement and flexibility of your back. These exercises also help to relieve pain, numbness, and tingling. Lumbar rotation  Lie on your back on a firm bed or the floor with your knees bent. Straighten your arms out to your sides so each arm forms a 90-degree angle (right angle) with a side of your body. Slowly move (rotate) both of your knees to one side of your body until you feel a stretch in your lower back (lumbar). Try not to let your shoulders lift off the floor. Hold this position for _____15_____ seconds. Tense your abdominal muscles and slowly move your knees back to the starting position. Repeat this exercise on the other side of your body. Repeat ____3______ times. Complete this exercise _____2_____ times a day. Single knee to chest  Lie on your back on a firm bed or the floor with both legs straight. Bend one of your knees. Use your hands to move your knee up toward your chest until you feel a gentle stretch in your lower back and buttock. Hold your leg in this position by holding on to the front of your knee. Keep your other leg as straight as possible. Hold this position for _____15_____ seconds. Slowly return to the starting position. Repeat with your other leg. Repeat ____3______ times. Complete this exercise _____2_____ times a day. Prone extension on  elbows  Lie on your abdomen on a firm bed or the floor (prone position). Prop yourself up on your elbows. Use your arms to help lift your chest up until you feel a gentle stretch in your abdomen and your lower back. This will place some of your body weight on your elbows. If this is uncomfortable, try stacking pillows under your chest. Your hips should stay down, against the surface that you are lying on. Keep your hip and back muscles relaxed. Hold this position for ____15______ seconds. Slowly relax your upper body and return to the starting position. Repeat ___3_______ times. Complete this exercise ___2_______ times a day. Strengthening exercises These exercises build strength and endurance in your back. Endurance is the ability to use your muscles for a long time, even after they get tired. Pelvic tilt This exercise strengthens the muscles that lie deep in the abdomen. Lie on your back on a firm bed or the floor with your legs extended. Bend your knees so they are pointing toward the ceiling and your feet are flat on the floor. Tighten your lower abdominal muscles to press your lower back against the floor. This motion will tilt your pelvis so your tailbone points up toward the ceiling instead of pointing to your feet or the floor. To help with this exercise, you may place a small towel under your lower back and try to push your back into the towel. Hold this position for ___15_______ seconds. Let your muscles relax completely before you  repeat this exercise. Repeat _____3_____ times. Complete this exercise _____2_____ times a day. Alternating arm and leg raises  Get on your hands and knees on a firm surface. If you are on a hard floor, you may want to use padding, such as an exercise mat, to cushion your knees. Line up your arms and legs. Your hands should be directly below your shoulders, and your knees should be directly below your hips. Lift your left leg behind you. At the same  time, raise your right arm and straighten it in front of you. Do not lift your leg higher than your hip. Do not lift your arm higher than your shoulder. Keep your abdominal and back muscles tight. Keep your hips facing the ground. Do not arch your back. Keep your balance carefully, and do not hold your breath. Hold this position for ____15______ seconds. Slowly return to the starting position. Repeat with your right leg and your left arm. Repeat _____3_____ times. Complete this exercise _____2_____ times a day. Abdominal set with straight leg raise  Lie on your back on a firm bed or the floor. Bend one of your knees and keep your other leg straight. Tense your abdominal muscles and lift your straight leg up, 4-6 inches (10-15 cm) off the ground. Keep your abdominal muscles tight and hold this position for _____15_____ seconds. Do not hold your breath. Do not arch your back. Keep it flat against the ground. Keep your abdominal muscles tense as you slowly lower your leg back to the starting position. Repeat with your other leg. Repeat _____3_____ times. Complete this exercise ____2______ times a day. Single leg lower with bent knees Lie on your back on a firm bed or the floor. Tense your abdominal muscles and lift your feet off the floor, one foot at a time, so your knees and hips are bent in 90-degree angles (right angles). Your knees should be over your hips and your lower legs should be parallel to the floor. Keeping your abdominal muscles tense and your knee bent, slowly lower one of your legs so your toe touches the ground. Lift your leg back up to return to the starting position. Do not hold your breath. Do not let your back arch. Keep your back flat against the ground. Repeat with your other leg. Repeat _____3_____ times. Complete this exercise ___2_______ times a day. Posture and body mechanics Good posture and healthy body mechanics can help to relieve stress in your body's  tissues and joints. Body mechanics refers to the movements and positions of your body while you do your daily activities. Posture is part of body mechanics. Good posture means: Your spine is in its natural S-curve position (neutral). Your shoulders are pulled back slightly. Your head is not tipped forward (neutral). Follow these guidelines to improve your posture and body mechanics in your everyday activities. Standing  When standing, keep your spine neutral and your feet about hip-width apart. Keep a slight bend in your knees. Your ears, shoulders, and hips should line up. When you do a task in which you stand in one place for a long time, place one foot up on a stable object that is 2-4 inches (5-10 cm) high, such as a footstool. This helps keep your spine neutral. Sitting  When sitting, keep your spine neutral and keep your feet flat on the floor. Use a footrest, if necessary, and keep your thighs parallel to the floor. Avoid rounding your shoulders, and avoid tilting your head forward. When working  at a desk or a computer, keep your desk at a height where your hands are slightly lower than your elbows. Slide your chair under your desk so you are close enough to maintain good posture. When working at a computer, place your monitor at a height where you are looking straight ahead and you do not have to tilt your head forward or downward to look at the screen. Resting When lying down and resting, avoid positions that are most painful for you. If you have pain with activities such as sitting, bending, stooping, or squatting, lie in a position in which your body does not bend very much. For example, avoid curling up on your side with your arms and knees near your chest (fetal position). If you have pain with activities such as standing for a long time or reaching with your arms, lie with your spine in a neutral position and bend your knees slightly. Try the following positions: Lying on your side  with a pillow between your knees. Lying on your back with a pillow under your knees. Lifting  When lifting objects, keep your feet at least shoulder-width apart and tighten your abdominal muscles. Bend your knees and hips and keep your spine neutral. It is important to lift using the strength of your legs, not your back. Do not lock your knees straight out. Always ask for help to lift heavy or awkward objects. This information is not intended to replace advice given to you by your health care provider. Make sure you discuss any questions you have with your health care provider. Document Revised: 01/17/2021 Document Reviewed: 01/17/2021 Elsevier Patient Education  2023 [employer].

## 2023-04-12 ENCOUNTER — Other Ambulatory Visit: Payer: Self-pay | Admitting: Nurse Practitioner

## 2023-06-11 ENCOUNTER — Other Ambulatory Visit: Payer: Self-pay | Admitting: Nurse Practitioner

## 2023-06-11 DIAGNOSIS — F4010 Social phobia, unspecified: Secondary | ICD-10-CM

## 2023-06-11 NOTE — Telephone Encounter (Signed)
Refill request last apt 04/02/23.

## 2023-06-14 NOTE — Telephone Encounter (Signed)
Can you fill this?

## 2023-06-28 ENCOUNTER — Other Ambulatory Visit: Payer: Self-pay | Admitting: Nurse Practitioner

## 2023-08-05 ENCOUNTER — Other Ambulatory Visit: Payer: Self-pay | Admitting: Nurse Practitioner

## 2023-08-05 DIAGNOSIS — E782 Mixed hyperlipidemia: Secondary | ICD-10-CM

## 2023-08-05 DIAGNOSIS — F5101 Primary insomnia: Secondary | ICD-10-CM

## 2023-08-05 DIAGNOSIS — F331 Major depressive disorder, recurrent, moderate: Secondary | ICD-10-CM

## 2023-08-05 DIAGNOSIS — F4010 Social phobia, unspecified: Secondary | ICD-10-CM

## 2023-08-05 DIAGNOSIS — I10 Essential (primary) hypertension: Secondary | ICD-10-CM

## 2023-08-06 NOTE — Telephone Encounter (Signed)
Last apt 04/02/23 did not see venlafaxine in current med list,

## 2023-09-09 ENCOUNTER — Other Ambulatory Visit: Payer: Self-pay | Admitting: Nurse Practitioner

## 2023-09-09 DIAGNOSIS — F5101 Primary insomnia: Secondary | ICD-10-CM

## 2023-09-13 ENCOUNTER — Inpatient Hospital Stay: Payer: BC Managed Care – PPO

## 2023-09-13 ENCOUNTER — Inpatient Hospital Stay: Payer: BC Managed Care – PPO | Attending: Internal Medicine | Admitting: Internal Medicine

## 2023-09-13 VITALS — BP 143/82 | HR 97 | Temp 98.2°F | Resp 16 | Ht 67.0 in | Wt 167.3 lb

## 2023-09-13 DIAGNOSIS — D500 Iron deficiency anemia secondary to blood loss (chronic): Secondary | ICD-10-CM

## 2023-09-13 DIAGNOSIS — D472 Monoclonal gammopathy: Secondary | ICD-10-CM | POA: Diagnosis not present

## 2023-09-13 DIAGNOSIS — D563 Thalassemia minor: Secondary | ICD-10-CM

## 2023-09-13 DIAGNOSIS — Z79899 Other long term (current) drug therapy: Secondary | ICD-10-CM | POA: Insufficient documentation

## 2023-09-13 DIAGNOSIS — N920 Excessive and frequent menstruation with regular cycle: Secondary | ICD-10-CM | POA: Diagnosis not present

## 2023-09-13 DIAGNOSIS — R519 Headache, unspecified: Secondary | ICD-10-CM | POA: Insufficient documentation

## 2023-09-13 LAB — CBC WITH DIFFERENTIAL (CANCER CENTER ONLY)
Abs Immature Granulocytes: 0.01 10*3/uL (ref 0.00–0.07)
Basophils Absolute: 0.1 10*3/uL (ref 0.0–0.1)
Basophils Relative: 1 %
Eosinophils Absolute: 0.1 10*3/uL (ref 0.0–0.5)
Eosinophils Relative: 1 %
HCT: 35.9 % — ABNORMAL LOW (ref 36.0–46.0)
Hemoglobin: 11.4 g/dL — ABNORMAL LOW (ref 12.0–15.0)
Immature Granulocytes: 0 %
Lymphocytes Relative: 37 %
Lymphs Abs: 1.8 10*3/uL (ref 0.7–4.0)
MCH: 22.8 pg — ABNORMAL LOW (ref 26.0–34.0)
MCHC: 31.8 g/dL (ref 30.0–36.0)
MCV: 71.9 fL — ABNORMAL LOW (ref 80.0–100.0)
Monocytes Absolute: 0.6 10*3/uL (ref 0.1–1.0)
Monocytes Relative: 13 %
Neutro Abs: 2.3 10*3/uL (ref 1.7–7.7)
Neutrophils Relative %: 48 %
Platelet Count: 274 10*3/uL (ref 150–400)
RBC: 4.99 MIL/uL (ref 3.87–5.11)
RDW: 15.2 % (ref 11.5–15.5)
WBC Count: 4.8 10*3/uL (ref 4.0–10.5)
nRBC: 0 % (ref 0.0–0.2)

## 2023-09-13 LAB — IRON AND IRON BINDING CAPACITY (CC-WL,HP ONLY)
Iron: 40 ug/dL (ref 28–170)
Saturation Ratios: 16 % (ref 10.4–31.8)
TIBC: 249 ug/dL — ABNORMAL LOW (ref 250–450)
UIBC: 209 ug/dL (ref 148–442)

## 2023-09-13 LAB — FERRITIN: Ferritin: 265 ng/mL (ref 11–307)

## 2023-09-13 NOTE — Progress Notes (Signed)
[facility] [phone]   [phone]  OFFICE PROGRESS NOTE  [name], NP [address] 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  PRIOR THERAPY: Feraheme infusion on as-needed basis  CURRENT THERAPY: None.  INTERVAL HISTORY: Jessica [name] 52 y.o. female returns to the clinic today for follow-up visit. Discussed the use of AI scribe software for clinical note transcription with the patient, who gave verbal consent to proceed.  History of Present Illness   Jessica [name], a 52 year old patient with a history of microcytic anemia due to iron deficiency and beta-thalassemia minor, as well as MGUS, presented with a recent return of menorrhagia after a seven-month hiatus. The patient reported that the heavy menstrual bleeding was accompanied by lightheadedness and dizziness, symptoms that had been absent during the period of amenorrhea.  In the months leading up to the return of menstruation, the patient experienced frequent headaches, a symptom not usually present. Additionally, the patient reported back pain, which was managed with muscle relaxers prescribed by her primary care physician. The patient suspected that the headaches might be related to neck pain, potentially due to her sleeping position.  The patient had been taking iron tablets to manage her anemia but discontinued due to severe constipation, despite daily use of a stool softener. The patient reported no other symptoms such as nausea, vomiting, chest pain, or shortness of breath. However, she did note a general feeling of low energy and malaise since the return of her menstrual cycle.        MEDICAL HISTORY: Past Medical History:  Diagnosis Date   Allergy    seasonal   Anemia    Anxiety     Depression    HYPERCHOLESTEROLEMIA 10/07/2007   Qualifier: Diagnosis of  By: Cheri Guppy     Hyperlipidemia    MGUS (monoclonal gammopathy of unknown significance)     ALLERGIES:  is allergic to sulfonamide derivatives.  MEDICATIONS:  Current Outpatient Medications  Medication Sig Dispense Refill   ALPRAZolam (XANAX) 0.25 MG tablet TAKE 0.125 - 0.25MG  (1/2 TO 1 TAB) FOR ANXIETY UP TO TWICE A DAY AS NEEDED, IF BUSPAR INEFFECTIVE. 30 tablet 1   atorvastatin (LIPITOR) 40 MG tablet TAKE 1 TABLET BY MOUTH EVERY DAY 30 tablet 5   busPIRone (BUSPAR) 5 MG tablet Take 5mg  (1 tablet)  as needed for anxiety up to 3 times in 1 day. 45 tablet 1   cyclobenzaprine (FLEXERIL) 5 MG tablet Take 1 tablet (5 mg total) by mouth 3 (three) times daily as needed for muscle spasms. 30 tablet 1   hydrochlorothiazide (HYDRODIURIL) 25 MG tablet TAKE 1 TABLET BY MOUTH EVERY DAY 30 tablet 5   traZODone (DESYREL) 50 MG tablet TAKE 1/2 TO 1 TABLET BY MOUTH AT BEDTIME AS NEEDED FOR SLEEP 30 tablet 5   venlafaxine XR (EFFEXOR-XR) 150 MG 24 hr capsule TAKE 1 CAPSULE BY MOUTH DAILY WITH BREAKFAST. 30 capsule 5   Vitamin D, Ergocalciferol, (DRISDOL) 1.25 MG (50000 UNIT) CAPS capsule TAKE 1 CAPSULE (50,000 UNITS TOTAL) BY MOUTH EVERY 7 (SEVEN) DAYS 4 capsule 2   No current facility-administered medications for this visit.    SURGICAL HISTORY:  Past Surgical History:  Procedure Laterality Date   TUBAL LIGATION      REVIEW OF SYSTEMS:  A comprehensive review of systems was negative except for: Constitutional:  positive for fatigue Neurological: positive for dizziness and headaches   PHYSICAL EXAMINATION: General appearance: alert, cooperative, fatigued, and no distress Head: Normocephalic, without obvious abnormality, atraumatic Neck: no adenopathy, no JVD, supple, symmetrical, trachea midline, and thyroid not enlarged, symmetric, no tenderness/mass/nodules Lymph nodes: Cervical, supraclavicular, and axillary nodes  normal. Resp: clear to auscultation bilaterally Back: symmetric, no curvature. ROM normal. No CVA tenderness. Cardio: regular rate and rhythm, S1, S2 normal, no murmur, click, rub or gallop GI: soft, non-tender; bowel sounds normal; no masses,  no organomegaly Extremities: extremities normal, atraumatic, no cyanosis or edema  ECOG PERFORMANCE STATUS: 1 - Symptomatic but completely ambulatory  Blood pressure (!) 143/82, pulse 97, temperature 98.2 F (36.8 C), temperature source Oral, resp. rate 16, height 5\' 7"  (1.702 m), weight 167 lb 5 oz (75.9 kg), SpO2 100%.  LABORATORY DATA: Lab Results  Component Value Date   WBC 5.4 03/15/2023   HGB 10.5 (L) 03/15/2023   HCT 34.4 (L) 03/15/2023   MCV 74.0 (L) 03/15/2023   PLT 254 03/15/2023      Chemistry      Component Value Date/Time   NA 142 01/23/2023 1059   K 4.6 01/23/2023 1059   CL 103 01/23/2023 1059   CO2 23 01/23/2023 1059   BUN 19 01/23/2023 1059   CREATININE 1.09 (H) 01/23/2023 1059   CREATININE 1.05 (H) 03/02/2020 1020   CREATININE 0.98 09/10/2017 1333      Component Value Date/Time   CALCIUM 10.0 01/23/2023 1059   ALKPHOS 82 01/23/2023 1059   AST 22 01/23/2023 1059   AST 14 (L) 03/02/2020 1020   ALT 19 01/23/2023 1059   ALT 10 03/02/2020 1020   BILITOT 0.3 01/23/2023 1059   BILITOT 0.3 03/02/2020 1020       RADIOGRAPHIC STUDIES: No results found.  ASSESSMENT AND PLAN: This is a very pleasant 52 years old African-American female presented for evaluation of microcytic hypochromic anemia likely secondary to beta thalassemia minor as well as iron deficiency.   She was previously treated with Feraheme infusion.   She does not take her oral iron tablet at regular basis because of constipation.    Iron Deficiency Anemia Heavy menses resumed after 7 months of amenorrhea. Reports dizziness and low energy. Iron supplementation discontinued due to constipation. Awaiting CBC and iron studies. -If iron and hemoglobin  levels are low, consider iron infusion. -Encourage iron-rich diet. -Consider resuming iron supplementation every other day with orange juice if tolerable.  Beta Thalassemia Minor Chronic condition contributing to microcytic anemia. -No change in management.  Monoclonal Gammopathy of Undetermined Significance (MGUS) Chronic condition under surveillance. -No change in management.  Headaches New onset, possibly related to neck strain or sleep position. -No change in management.  Follow-up in 6 months, or sooner if lab results are concerning.  The patient was advised to call immediately if she has any other concerning symptoms in the interval.   The patient voices understanding of current disease status and treatment options and is in agreement with the current care plan.  All questions were answered. The patient knows to call the clinic with any problems, questions or concerns. We can certainly see the patient much sooner if necessary.  Disclaimer: This note was dictated with voice recognition software. Similar sounding words can inadvertently be transcribed and may not be corrected upon review.

## 2023-10-10 ENCOUNTER — Other Ambulatory Visit: Payer: Self-pay | Admitting: Nurse Practitioner

## 2023-10-10 DIAGNOSIS — F5101 Primary insomnia: Secondary | ICD-10-CM

## 2023-10-10 NOTE — Telephone Encounter (Signed)
Asking to change to 90 day supply instead of 30.

## 2024-01-12 ENCOUNTER — Other Ambulatory Visit: Payer: Self-pay | Admitting: Nurse Practitioner

## 2024-01-14 ENCOUNTER — Ambulatory Visit: Admitting: Internal Medicine

## 2024-01-14 VITALS — BP 120/70 | HR 95 | Temp 98.5°F | Ht 67.0 in | Wt 175.0 lb

## 2024-01-14 DIAGNOSIS — K5904 Chronic idiopathic constipation: Secondary | ICD-10-CM | POA: Diagnosis not present

## 2024-01-14 DIAGNOSIS — M549 Dorsalgia, unspecified: Secondary | ICD-10-CM

## 2024-01-14 NOTE — Progress Notes (Signed)
 Patient Care Team: Early, Sung Amabile, NP as PCP - General (Nurse Practitioner) Maisie Fus, MD as PCP - Cardiology (Cardiology) Si Gaul, MD as Consulting Physician (Oncology)  Visit Date: 01/14/24 Subjective:   Chief Complaint  Patient presents with   Bloated        Constipation   Back Pain    Back spasms left back.   Patient ZO:XWRUE Glore,Female DOB:10/19/1971,53 y.o. AVW:[id number]   53 y.o. Female presents today for acute sick visit with Bloating Constipation, Back Spasms x2 weeks. Patient has a past medical history of Colonoscopy 2023 normal. Says that she has always had constipation, even since childhood, which usually has been relieved with water (tries to stay well hydrated) and 1 capful of Miralax, but her bloating is a new symptom she has been dealing with for the past 2 weeks. Notes she did have a bowel movement yesterday, but did have to strain.   Regarding her back spasms, she says that these started after reaching down over a playpen wall to pick up her 17 lbs granddaughter, and she has been trying to use a heating pad, tiger balm, and Aleve to alleviate but hasn't noticed any improvement.   Past Medical History:  Diagnosis Date   Allergy    seasonal   Anemia    Anxiety    Depression    HYPERCHOLESTEROLEMIA 10/07/2007   Qualifier: Diagnosis of  By: Cheri Guppy     Hyperlipidemia    MGUS (monoclonal gammopathy of unknown significance)     Allergies  Allergen Reactions   Sulfonamide Derivatives     REACTION: hives    Family History  Problem Relation Age of Onset   Heart disease Mother 53       MI   Hyperlipidemia Mother    Hypertension Mother    Alcohol abuse Father    Drug abuse Father    Hyperlipidemia Father    Hypertension Father    Alcohol abuse Maternal Uncle    Colon polyps Maternal Grandmother    Heart disease Maternal Grandmother    Hypertension Maternal Grandmother    Hyperlipidemia Maternal Grandmother    Cancer Paternal  Grandmother    Diabetes Paternal Grandmother    Hypertension Paternal Grandmother    Cancer Paternal Grandfather    Breast cancer Neg Hx    Colon cancer Neg Hx    Esophageal cancer Neg Hx    Stomach cancer Neg Hx    Rectal cancer Neg Hx    Social History   Social History Narrative   Not on file   Review of Systems  Gastrointestinal:  Positive for constipation.       (+) Bloating  Musculoskeletal:  Positive for back pain (left-side).  All other systems reviewed and are negative.    Objective:  Vitals: BP 120/70   Pulse 95   Temp 98.5 F (36.9 C)   Ht 5\' 7"  (1.702 m)   Wt 175 lb (79.4 kg)   LMP 09/12/2023   SpO2 98%   BMI 27.41 kg/m   Physical Exam Vitals and nursing note reviewed.  Constitutional:      General: She is not in acute distress.    Appearance: Normal appearance. She is not toxic-appearing.  HENT:     Head: Normocephalic and atraumatic.  Pulmonary:     Effort: Pulmonary effort is normal.  Musculoskeletal:     Thoracic back: Tenderness (left-side) present.  Skin:    General: Skin is warm and dry.  Neurological:  Mental Status: She is alert and oriented to person, place, and time. Mental status is at baseline.  Psychiatric:        Mood and Affect: Mood normal.        Behavior: Behavior normal.        Thought Content: Thought content normal.        Judgment: Judgment normal.     Results:  Studies Obtained And Personally Reviewed By Me: Labs:     Component Value Date/Time   NA 142 01/23/2023 1059   K 4.6 01/23/2023 1059   CL 103 01/23/2023 1059   CO2 23 01/23/2023 1059   GLUCOSE 103 (H) 01/23/2023 1059   GLUCOSE 112 (H) 03/02/2020 1020   BUN 19 01/23/2023 1059   CREATININE 1.09 (H) 01/23/2023 1059   CREATININE 1.05 (H) 03/02/2020 1020   CREATININE 0.98 09/10/2017 1333   CALCIUM 10.0 01/23/2023 1059   PROT 7.8 01/23/2023 1059   ALBUMIN 4.5 01/23/2023 1059   AST 22 01/23/2023 1059   AST 14 (L) 03/02/2020 1020   ALT 19 01/23/2023 1059    ALT 10 03/02/2020 1020   ALKPHOS 82 01/23/2023 1059   BILITOT 0.3 01/23/2023 1059   BILITOT 0.3 03/02/2020 1020   GFRNONAA 60 08/25/2020 0838   GFRNONAA >60 03/02/2020 1020   GFRAA 69 08/25/2020 0838   GFRAA >60 03/02/2020 1020    Lab Results  Component Value Date   WBC 4.8 09/13/2023   HGB 11.4 (L) 09/13/2023   HCT 35.9 (L) 09/13/2023   MCV 71.9 (L) 09/13/2023   PLT 274 09/13/2023   Lab Results  Component Value Date   CHOL 212 (H) 01/23/2023   HDL 87 01/23/2023   LDLCALC 115 (H) 01/23/2023   TRIG 56 01/23/2023   CHOLHDL 2.4 01/23/2023   Lab Results  Component Value Date   HGBA1C 5.7 (H) 01/23/2023    Lab Results  Component Value Date   TSH 2.840 01/23/2023   Assessment & Plan:   Constipation: instructed to take 2 tablets of Dulcolax, start 1 capful of Miralax daily, stay well-hydrated throughout the day, and can add a fiber supplement into diet regimen.    Back Pain: recommended using ice or heat, whichever best alleviates her pain.    I,Emily Lagle,acting as a Neurosurgeon for Margaree Mackintosh, MD.,have documented all relevant documentation on the behalf of Margaree Mackintosh, MD,as directed by  Margaree Mackintosh, MD while in the presence of Margaree Mackintosh, MD.   ***

## 2024-01-16 ENCOUNTER — Encounter: Payer: Self-pay | Admitting: Internal Medicine

## 2024-01-16 NOTE — Patient Instructions (Addendum)
 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  You had a negative GI workup by Dr. Orvan Falconer in 2023 which is reassuring.  Suggest taking 2 tabs of Dulcolax laxative by mouth to relieve constipation and then start MiraLAX daily starting initially with 1 capful in 8 ounces of water and may increase the dose if needed to promote a good bowel movement on a daily basis.  You can also add a fiber supplement into your daily regimen as well.  For back pain, I feel this is musculoskeletal pain perhaps relating to straining to pass stool or overexertion.  May take over-the-counter pain reliever such as Tylenol or Aleve if needed and use heating pad or ice.  It was a pleasure to see you today.  We hope you feel better soon.

## 2024-01-20 ENCOUNTER — Other Ambulatory Visit: Payer: Self-pay | Admitting: Nurse Practitioner

## 2024-01-20 DIAGNOSIS — E782 Mixed hyperlipidemia: Secondary | ICD-10-CM

## 2024-01-20 DIAGNOSIS — I10 Essential (primary) hypertension: Secondary | ICD-10-CM

## 2024-01-29 ENCOUNTER — Ambulatory Visit: Admitting: Nurse Practitioner

## 2024-01-29 ENCOUNTER — Encounter: Payer: Self-pay | Admitting: Nurse Practitioner

## 2024-01-29 VITALS — BP 150/88 | HR 84 | Wt 170.2 lb

## 2024-01-29 DIAGNOSIS — M546 Pain in thoracic spine: Secondary | ICD-10-CM

## 2024-01-29 DIAGNOSIS — E782 Mixed hyperlipidemia: Secondary | ICD-10-CM

## 2024-01-29 DIAGNOSIS — Z791 Long term (current) use of non-steroidal anti-inflammatories (NSAID): Secondary | ICD-10-CM | POA: Insufficient documentation

## 2024-01-29 DIAGNOSIS — I10 Essential (primary) hypertension: Secondary | ICD-10-CM | POA: Diagnosis not present

## 2024-01-29 DIAGNOSIS — R140 Abdominal distension (gaseous): Secondary | ICD-10-CM | POA: Insufficient documentation

## 2024-01-29 DIAGNOSIS — F5101 Primary insomnia: Secondary | ICD-10-CM | POA: Diagnosis not present

## 2024-01-29 DIAGNOSIS — F331 Major depressive disorder, recurrent, moderate: Secondary | ICD-10-CM

## 2024-01-29 DIAGNOSIS — F4010 Social phobia, unspecified: Secondary | ICD-10-CM

## 2024-01-29 HISTORY — DX: Long term (current) use of non-steroidal anti-inflammatories (nsaid): Z79.1

## 2024-01-29 HISTORY — DX: Abdominal distension (gaseous): R14.0

## 2024-01-29 HISTORY — DX: Pain in thoracic spine: M54.6

## 2024-01-29 MED ORDER — MELOXICAM 15 MG PO TABS: 15.0000 mg | ORAL_TABLET | Freq: Every day | ORAL | 1 refills | Status: DC

## 2024-01-29 MED ORDER — BUSPIRONE HCL 5 MG PO TABS: ORAL_TABLET | ORAL | 3 refills | Status: DC

## 2024-01-29 MED ORDER — ATORVASTATIN CALCIUM 40 MG PO TABS: 40.0000 mg | ORAL_TABLET | Freq: Every day | ORAL | 3 refills | Status: DC

## 2024-01-29 MED ORDER — METHYLPREDNISOLONE SODIUM SUCC 125 MG IJ SOLR
125.0000 mg | Freq: Once | INTRAMUSCULAR | Status: AC
Start: 2024-01-29 — End: 2024-01-29
  Administered 2024-01-29: 125 mg via INTRAMUSCULAR

## 2024-01-29 MED ORDER — ALPRAZOLAM 0.25 MG PO TABS: ORAL_TABLET | ORAL | 1 refills | Status: DC

## 2024-01-29 MED ORDER — METHOCARBAMOL 750 MG PO TABS: 750.0000 mg | ORAL_TABLET | Freq: Three times a day (TID) | ORAL | 2 refills | Status: DC | PRN

## 2024-01-29 MED ORDER — KETOROLAC TROMETHAMINE 60 MG/2ML IM SOLN
60.0000 mg | Freq: Once | INTRAMUSCULAR | Status: AC
  Administered 2024-01-29: 60 mg via INTRAMUSCULAR

## 2024-01-29 MED ORDER — PREDNISONE 20 MG PO TABS: ORAL_TABLET | ORAL | 0 refills | Status: DC

## 2024-01-29 MED ORDER — PANTOPRAZOLE SODIUM 40 MG PO TBEC: 40.0000 mg | DELAYED_RELEASE_TABLET | Freq: Every day | ORAL | 3 refills | Status: DC

## 2024-01-29 MED ORDER — VENLAFAXINE HCL ER 75 MG PO CP24: 75.0000 mg | ORAL_CAPSULE | Freq: Every day | ORAL | 1 refills | Status: DC

## 2024-01-29 MED ORDER — HYDROCHLOROTHIAZIDE 25 MG PO TABS: 25.0000 mg | ORAL_TABLET | Freq: Every day | ORAL | 3 refills | Status: DC

## 2024-01-29 NOTE — Assessment & Plan Note (Signed)
 Refill of medications provided.

## 2024-01-29 NOTE — Progress Notes (Signed)
 Jessica Clamp, DNP, AGNP-c [facility]  [address] [place], [place] [address] [phone]  ESTABLISHED PATIENT- Chronic Health and/or Follow-Up Visit  Blood pressure (!) 150/88, pulse 84, weight 170 lb 3.2 oz (77.2 kg), last menstrual period 09/12/2023.    Jessica [name] is a 53 y.o. year old female presenting today for evaluation and management of chronic conditions.   History of Present Illness Jessica [name] is a 53 year old female who presents with back pain and bloating.  She has been experiencing back pain for the past month, which began after lifting her one-year-old child, weighing approximately 17.5 pounds, out of a playpen. The pain initially started in the lower back but has since moved to the left side under the shoulder blade. It is described as spasmodic and worsens at night, radiating into her left leg. She has tried using Flexeril, Tylenol, Aleve, and a heating pad without relief. Sleeping lying flat or on her side is uncomfortable. No tenderness on the spine and the pain does not radiate into her arms. Leg pain occurs only at night on the left side.  She also reports feeling bloated since the onset of her back pain. She has a history of constipation, which she attributes to her antidepressant medication, venlafaxine. She has been using Miralax and Colace to manage her constipation. Bloating worsens with eating or drinking water and is accompanied by occasional acid reflux symptoms, such as a sensation of something coming back up once or twice a week, but no burning sensation. No tenderness in the abdomen and no significant burping.  Her current medications include venlafaxine 150 mg, which may contribute to her constipation. She also takes iron supplements every other day, which can exacerbate constipation. She uses Buspar as needed and has been on Miralax for years. She is trying to increase her water intake to help with her symptoms.  All ROS  negative with exception of what is listed above.   PHYSICAL EXAM Physical Exam Vitals and nursing note reviewed.  Constitutional:      General: She is not in acute distress.    Appearance: Normal appearance.  Eyes:     Conjunctiva/sclera: Conjunctivae normal.  Musculoskeletal:        General: Tenderness present.       Arms:     Right lower leg: No edema.     Left lower leg: No edema.     Comments: Spasms present.   Neurological:     Mental Status: She is alert and oriented to person, place, and time.     Sensory: No sensory deficit.     Motor: Weakness present.     Coordination: Coordination normal.  Psychiatric:        Behavior: Behavior normal.      PLAN Problem List Items Addressed This Visit     Major depressive disorder, recurrent episode, moderate (HCC)   Concern with medication as causing worsening constipation. She would like to taper off of medication that could be offending. Given that venlafaxine can have side effects when stopping, we will start a slow taper with every other day lowering the dose for 2 weeks then dropping to daily at the lower dose. She understands to restart the normal dose if she begins to have any worsen depression or side effects.  - Start alternating 75mg  and 150mg  every other day for 2 weeks then drop to 75mg  daily.       Relevant Medications   busPIRone (BUSPAR) 5 MG tablet   venlafaxine  XR (EFFEXOR-XR) 75 MG 24 hr capsule   ALPRAZolam (XANAX) 0.25 MG tablet   Mixed hyperlipidemia   Refill of medications provided.       Relevant Medications   hydrochlorothiazide (HYDRODIURIL) 25 MG tablet   atorvastatin (LIPITOR) 40 MG tablet   Essential hypertension   Refill of medications provided. Blood pressure elevated today, but likely related to patient in pain at this time.       Relevant Medications   hydrochlorothiazide (HYDRODIURIL) 25 MG tablet   atorvastatin (LIPITOR) 40 MG tablet   Acute left-sided thoracic back pain - Primary    Chronic back pain for one month, localized to the left side under the shoulder blade, with radiation to the left leg at night. Initial injury occurred while lifting a 17.5-pound child. Flexeril, Tylenol, Aleve, and heating pad have been ineffective. Differential includes muscle spasm, pinched nerve, or inflammation. No spinal tenderness noted. Possible nerve involvement due to leg radiation. MRI considered but not immediately pursued due to cost; x-ray may be considered if no improvement. Discussed that x-rays show bone structure but not nerves or muscles, and MRI would be needed for detailed imaging of soft tissues. Considered that muscle spasm could be secondary to nerve irritation or vice versa. Discussed potential degeneration of spinal discs as a contributing factor. - Administer Toradol and steroid injection for immediate relief, expected to provide relief within an hour and last for a few days. - Prescribe prednisone to reduce inflammation. - Prescribe methocarbamol (Robaxin) as an alternative muscle relaxant. - Prescribe meloxicam as an NSAID for inflammation. - Provide stretching exercises. - Consider physical therapy if no improvement with medication.      Relevant Medications   pantoprazole (PROTONIX) 40 MG tablet   methocarbamol (ROBAXIN-750) 750 MG tablet   predniSONE (DELTASONE) 20 MG tablet   meloxicam (MOBIC) 15 MG tablet   Encounter for long-term (current) use of NSAIDs   Relevant Medications   pantoprazole (PROTONIX) 40 MG tablet   Abdominal bloating   Chronic constipation possibly exacerbated by venlafaxine. Bloating and occasional acid reflux symptoms noted, possibly related to NSAID use. Reports bloating since back pain onset, with no prior history of bloating. Iron supplementation may contribute to constipation. Discussed that gastritis or GERD could be underlying causes of bloating, especially with NSAID use. Considered reducing venlafaxine to assess impact on  constipation. - Reduce venlafaxine dose from 150 mg to 75 mg over two weeks, alternating doses initially. Monitor mood closely and return to 150 mg if mood deteriorates. - Continue Miralax and consider increasing to two capfuls if needed. - Continue Colace as advised by previous provider. - Prescribe pantoprazole for 30 days to reduce stomach acid and heal potential NSAID-induced gastritis.      Relevant Medications   pantoprazole (PROTONIX) 40 MG tablet   Social anxiety disorder   Relevant Medications   busPIRone (BUSPAR) 5 MG tablet   venlafaxine XR (EFFEXOR-XR) 75 MG 24 hr capsule   ALPRAZolam (XANAX) 0.25 MG tablet   Primary insomnia    Return for CPE - At convenience.  Jessica Clamp, DNP, AGNP-c

## 2024-01-29 NOTE — Assessment & Plan Note (Signed)
 Chronic back pain for one month, localized to the left side under the shoulder blade, with radiation to the left leg at night. Initial injury occurred while lifting a 17.5-pound child. Flexeril, Tylenol, Aleve, and heating pad have been ineffective. Differential includes muscle spasm, pinched nerve, or inflammation. No spinal tenderness noted. Possible nerve involvement due to leg radiation. MRI considered but not immediately pursued due to cost; x-ray may be considered if no improvement. Discussed that x-rays show bone structure but not nerves or muscles, and MRI would be needed for detailed imaging of soft tissues. Considered that muscle spasm could be secondary to nerve irritation or vice versa. Discussed potential degeneration of spinal discs as a contributing factor. - Administer Toradol and steroid injection for immediate relief, expected to provide relief within an hour and last for a few days. - Prescribe prednisone to reduce inflammation. - Prescribe methocarbamol (Robaxin) as an alternative muscle relaxant. - Prescribe meloxicam as an NSAID for inflammation. - Provide stretching exercises. - Consider physical therapy if no improvement with medication.

## 2024-01-29 NOTE — Assessment & Plan Note (Signed)
 Refill of medications provided. Blood pressure elevated today, but likely related to patient in pain at this time.

## 2024-01-29 NOTE — Assessment & Plan Note (Signed)
 Chronic constipation possibly exacerbated by venlafaxine. Bloating and occasional acid reflux symptoms noted, possibly related to NSAID use. Reports bloating since back pain onset, with no prior history of bloating. Iron supplementation may contribute to constipation. Discussed that gastritis or GERD could be underlying causes of bloating, especially with NSAID use. Considered reducing venlafaxine to assess impact on constipation. - Reduce venlafaxine dose from 150 mg to 75 mg over two weeks, alternating doses initially. Monitor mood closely and return to 150 mg if mood deteriorates. - Continue Miralax and consider increasing to two capfuls if needed. - Continue Colace as advised by previous provider. - Prescribe pantoprazole for 30 days to reduce stomach acid and heal potential NSAID-induced gastritis.

## 2024-01-29 NOTE — Patient Instructions (Signed)
 Low Back Sprain or Strain Rehab Ask your health care provider which exercises are safe for you. Do exercises exactly as told by your health care provider and adjust them as directed. It is normal to feel mild stretching, pulling, tightness, or discomfort as you do these exercises. Stop right away if you feel sudden pain or your pain gets worse. Do not begin these exercises until told by your health care provider. Stretching and range-of-motion exercises These exercises warm up your muscles and joints and improve the movement and flexibility of your back. These exercises also help to relieve pain, numbness, and tingling. Lumbar rotation  Lie on your back on a firm bed or the floor with your knees bent. Straighten your arms out to your sides so each arm forms a 90-degree angle (right angle) with a side of your body. Slowly move (rotate) both of your knees to one side of your body until you feel a stretch in your lower back (lumbar). Try not to let your shoulders lift off the floor. Hold this position for __________ seconds. Tense your abdominal muscles and slowly move your knees back to the starting position. Repeat this exercise on the other side of your body. Repeat __________ times. Complete this exercise __________ times a day. Single knee to chest  Lie on your back on a firm bed or the floor with both legs straight. Bend one of your knees. Use your hands to move your knee up toward your chest until you feel a gentle stretch in your lower back and buttock. Hold your leg in this position by holding on to the front of your knee. Keep your other leg as straight as possible. Hold this position for __________ seconds. Slowly return to the starting position. Repeat with your other leg. Repeat __________ times. Complete this exercise __________ times a day. Prone extension on elbows  Lie on your abdomen on a firm bed or the floor (prone position). Prop yourself up on your elbows. Use your arms  to help lift your chest up until you feel a gentle stretch in your abdomen and your lower back. This will place some of your body weight on your elbows. If this is uncomfortable, try stacking pillows under your chest. Your hips should stay down, against the surface that you are lying on. Keep your hip and back muscles relaxed. Hold this position for __________ seconds. Slowly relax your upper body and return to the starting position. Repeat __________ times. Complete this exercise __________ times a day. Strengthening exercises These exercises build strength and endurance in your back. Endurance is the ability to use your muscles for a long time, even after they get tired. Pelvic tilt This exercise strengthens the muscles that lie deep in the abdomen. Lie on your back on a firm bed or the floor with your legs extended. Bend your knees so they are pointing toward the ceiling and your feet are flat on the floor. Tighten your lower abdominal muscles to press your lower back against the floor. This motion will tilt your pelvis so your tailbone points up toward the ceiling instead of pointing to your feet or the floor. To help with this exercise, you may place a small towel under your lower back and try to push your back into the towel. Hold this position for __________ seconds. Let your muscles relax completely before you repeat this exercise. Repeat __________ times. Complete this exercise __________ times a day. Alternating arm and leg raises  Get on your hands  and knees on a firm surface. If you are on a hard floor, you may want to use padding, such as an exercise mat, to cushion your knees. Line up your arms and legs. Your hands should be directly below your shoulders, and your knees should be directly below your hips. Lift your left leg behind you. At the same time, raise your right arm and straighten it in front of you. Do not lift your leg higher than your hip. Do not lift your arm higher  than your shoulder. Keep your abdominal and back muscles tight. Keep your hips facing the ground. Do not arch your back. Keep your balance carefully, and do not hold your breath. Hold this position for __________ seconds. Slowly return to the starting position. Repeat with your right leg and your left arm. Repeat __________ times. Complete this exercise __________ times a day. Abdominal set with straight leg raise  Lie on your back on a firm bed or the floor. Bend one of your knees and keep your other leg straight. Tense your abdominal muscles and lift your straight leg up, 4-6 inches (10-15 cm) off the ground. Keep your abdominal muscles tight and hold this position for __________ seconds. Do not hold your breath. Do not arch your back. Keep it flat against the ground. Keep your abdominal muscles tense as you slowly lower your leg back to the starting position. Repeat with your other leg. Repeat __________ times. Complete this exercise __________ times a day. Single leg lower with bent knees Lie on your back on a firm bed or the floor. Tense your abdominal muscles and lift your feet off the floor, one foot at a time, so your knees and hips are bent in 90-degree angles (right angles). Your knees should be over your hips and your lower legs should be parallel to the floor. Keeping your abdominal muscles tense and your knee bent, slowly lower one of your legs so your toe touches the ground. Lift your leg back up to return to the starting position. Do not hold your breath. Do not let your back arch. Keep your back flat against the ground. Repeat with your other leg. Repeat __________ times. Complete this exercise __________ times a day. Posture and body mechanics Good posture and healthy body mechanics can help to relieve stress in your body's tissues and joints. Body mechanics refers to the movements and positions of your body while you do your daily activities. Posture is part of body  mechanics. Good posture means: Your spine is in its natural S-curve position (neutral). Your shoulders are pulled back slightly. Your head is not tipped forward (neutral). Follow these guidelines to improve your posture and body mechanics in your everyday activities. Standing  When standing, keep your spine neutral and your feet about hip-width apart. Keep a slight bend in your knees. Your ears, shoulders, and hips should line up. When you do a task in which you stand in one place for a long time, place one foot up on a stable object that is 2-4 inches (5-10 cm) high, such as a footstool. This helps keep your spine neutral. Sitting  When sitting, keep your spine neutral and keep your feet flat on the floor. Use a footrest, if necessary, and keep your thighs parallel to the floor. Avoid rounding your shoulders, and avoid tilting your head forward. When working at a desk or a computer, keep your desk at a height where your hands are slightly lower than your elbows. Slide your  chair under your desk so you are close enough to maintain good posture. When working at a computer, place your monitor at a height where you are looking straight ahead and you do not have to tilt your head forward or downward to look at the screen. Resting When lying down and resting, avoid positions that are most painful for you. If you have pain with activities such as sitting, bending, stooping, or squatting, lie in a position in which your body does not bend very much. For example, avoid curling up on your side with your arms and knees near your chest (fetal position). If you have pain with activities such as standing for a long time or reaching with your arms, lie with your spine in a neutral position and bend your knees slightly. Try the following positions: Lying on your side with a pillow between your knees. Lying on your back with a pillow under your knees. Lifting  When lifting objects, keep your feet at least  shoulder-width apart and tighten your abdominal muscles. Bend your knees and hips and keep your spine neutral. It is important to lift using the strength of your legs, not your back. Do not lock your knees straight out. Always ask for help to lift heavy or awkward objects. This information is not intended to replace advice given to you by your health care provider. Make sure you discuss any questions you have with your health care provider. Document Revised: 03/05/2023 Document Reviewed: 01/17/2021 [employer] Patient Education  2024 [employer].

## 2024-01-29 NOTE — Assessment & Plan Note (Signed)
 Concern with medication as causing worsening constipation. She would like to taper off of medication that could be offending. Given that venlafaxine can have side effects when stopping, we will start a slow taper with every other day lowering the dose for 2 weeks then dropping to daily at the lower dose. She understands to restart the normal dose if she begins to have any worsen depression or side effects.  - Start alternating 75mg  and 150mg  every other day for 2 weeks then drop to 75mg  daily.

## 2024-02-05 ENCOUNTER — Other Ambulatory Visit: Payer: Self-pay | Admitting: Nurse Practitioner

## 2024-02-05 DIAGNOSIS — F4010 Social phobia, unspecified: Secondary | ICD-10-CM

## 2024-02-05 NOTE — Telephone Encounter (Signed)
Asking for 90 day supply

## 2024-02-06 ENCOUNTER — Other Ambulatory Visit: Payer: Self-pay | Admitting: Nurse Practitioner

## 2024-02-06 DIAGNOSIS — Z1231 Encounter for screening mammogram for malignant neoplasm of breast: Secondary | ICD-10-CM

## 2024-02-10 ENCOUNTER — Other Ambulatory Visit: Payer: Self-pay | Admitting: Nurse Practitioner

## 2024-02-10 DIAGNOSIS — F4010 Social phobia, unspecified: Secondary | ICD-10-CM

## 2024-02-10 DIAGNOSIS — F331 Major depressive disorder, recurrent, moderate: Secondary | ICD-10-CM

## 2024-02-14 ENCOUNTER — Ambulatory Visit
Admission: RE | Admit: 2024-02-14 | Discharge: 2024-02-14 | Disposition: A | Discharge status: Home / Self Care | Source: Ambulatory Visit | Attending: Nurse Practitioner | Admitting: Nurse Practitioner

## 2024-02-14 DIAGNOSIS — Z1231 Encounter for screening mammogram for malignant neoplasm of breast: Secondary | ICD-10-CM

## 2024-02-18 ENCOUNTER — Other Ambulatory Visit: Payer: Self-pay | Admitting: Nurse Practitioner

## 2024-02-18 DIAGNOSIS — R928 Other abnormal and inconclusive findings on diagnostic imaging of breast: Secondary | ICD-10-CM

## 2024-02-25 ENCOUNTER — Ambulatory Visit
Admission: RE | Admit: 2024-02-25 | Discharge: 2024-02-25 | Disposition: A | Discharge status: Home / Self Care | Source: Ambulatory Visit | Attending: Nurse Practitioner | Admitting: Nurse Practitioner

## 2024-02-25 ENCOUNTER — Ambulatory Visit
Admission: RE | Admit: 2024-02-25 | Discharge: 2024-02-25 | Disposition: A | Discharge status: Home / Self Care | Source: Ambulatory Visit | Attending: Nurse Practitioner

## 2024-02-25 DIAGNOSIS — N6315 Unspecified lump in the right breast, overlapping quadrants: Secondary | ICD-10-CM | POA: Diagnosis not present

## 2024-02-25 DIAGNOSIS — R928 Other abnormal and inconclusive findings on diagnostic imaging of breast: Secondary | ICD-10-CM

## 2024-02-25 DIAGNOSIS — N6001 Solitary cyst of right breast: Secondary | ICD-10-CM | POA: Diagnosis not present

## 2024-03-03 ENCOUNTER — Inpatient Hospital Stay: Payer: BC Managed Care – PPO | Attending: Internal Medicine

## 2024-03-03 DIAGNOSIS — Z79899 Other long term (current) drug therapy: Secondary | ICD-10-CM | POA: Insufficient documentation

## 2024-03-03 DIAGNOSIS — I10 Essential (primary) hypertension: Secondary | ICD-10-CM | POA: Diagnosis not present

## 2024-03-03 DIAGNOSIS — R7989 Other specified abnormal findings of blood chemistry: Secondary | ICD-10-CM | POA: Insufficient documentation

## 2024-03-03 DIAGNOSIS — D500 Iron deficiency anemia secondary to blood loss (chronic): Secondary | ICD-10-CM

## 2024-03-03 DIAGNOSIS — D472 Monoclonal gammopathy: Secondary | ICD-10-CM | POA: Diagnosis not present

## 2024-03-03 DIAGNOSIS — N920 Excessive and frequent menstruation with regular cycle: Secondary | ICD-10-CM | POA: Diagnosis not present

## 2024-03-03 DIAGNOSIS — D509 Iron deficiency anemia, unspecified: Secondary | ICD-10-CM | POA: Diagnosis not present

## 2024-03-03 DIAGNOSIS — D563 Thalassemia minor: Secondary | ICD-10-CM | POA: Insufficient documentation

## 2024-03-03 LAB — CBC WITH DIFFERENTIAL (CANCER CENTER ONLY)
Abs Immature Granulocytes: 0.03 10*3/uL (ref 0.00–0.07)
Basophils Absolute: 0.1 10*3/uL (ref 0.0–0.1)
Basophils Relative: 1 %
Eosinophils Absolute: 0.2 10*3/uL (ref 0.0–0.5)
Eosinophils Relative: 3 %
HCT: 33 % — ABNORMAL LOW (ref 36.0–46.0)
Hemoglobin: 10.2 g/dL — ABNORMAL LOW (ref 12.0–15.0)
Immature Granulocytes: 0 %
Lymphocytes Relative: 32 %
Lymphs Abs: 2.3 10*3/uL (ref 0.7–4.0)
MCH: 22.5 pg — ABNORMAL LOW (ref 26.0–34.0)
MCHC: 30.9 g/dL (ref 30.0–36.0)
MCV: 72.7 fL — ABNORMAL LOW (ref 80.0–100.0)
Monocytes Absolute: 0.5 10*3/uL (ref 0.1–1.0)
Monocytes Relative: 8 %
Neutro Abs: 4 10*3/uL (ref 1.7–7.7)
Neutrophils Relative %: 56 %
Platelet Count: 310 10*3/uL (ref 150–400)
RBC: 4.54 MIL/uL (ref 3.87–5.11)
RDW: 16.6 % — ABNORMAL HIGH (ref 11.5–15.5)
WBC Count: 7.1 10*3/uL (ref 4.0–10.5)
nRBC: 0 % (ref 0.0–0.2)

## 2024-03-03 LAB — CMP (CANCER CENTER ONLY)
ALT: 21 U/L (ref 0–44)
AST: 20 U/L (ref 15–41)
Albumin: 4.2 g/dL (ref 3.5–5.0)
Alkaline Phosphatase: 73 U/L (ref 38–126)
Anion gap: 7 (ref 5–15)
BUN: 20 mg/dL (ref 6–20)
CO2: 29 mmol/L (ref 22–32)
Calcium: 9.4 mg/dL (ref 8.9–10.3)
Chloride: 107 mmol/L (ref 98–111)
Creatinine: 1.43 mg/dL — ABNORMAL HIGH (ref 0.44–1.00)
GFR, Estimated: 44 mL/min — ABNORMAL LOW (ref >60)
Glucose, Bld: 101 mg/dL — ABNORMAL HIGH (ref 70–99)
Potassium: 3.6 mmol/L (ref 3.5–5.1)
Sodium: 143 mmol/L (ref 135–145)
Total Bilirubin: 0.4 mg/dL (ref 0.0–1.2)
Total Protein: 7.6 g/dL (ref 6.5–8.1)

## 2024-03-03 LAB — LACTATE DEHYDROGENASE: LDH: 173 U/L (ref 98–192)

## 2024-03-03 LAB — IRON AND IRON BINDING CAPACITY (CC-WL,HP ONLY)
Iron: 81 ug/dL (ref 28–170)
Saturation Ratios: 30 % (ref 10.4–31.8)
TIBC: 269 ug/dL (ref 250–450)
UIBC: 188 ug/dL (ref 148–442)

## 2024-03-03 LAB — FERRITIN: Ferritin: 181 ng/mL (ref 11–307)

## 2024-03-04 LAB — KAPPA/LAMBDA LIGHT CHAINS
Kappa free light chain: 67.2 mg/L — ABNORMAL HIGH (ref 3.3–19.4)
Kappa, lambda light chain ratio: 3.84 — ABNORMAL HIGH (ref 0.26–1.65)
Lambda free light chains: 17.5 mg/L (ref 5.7–26.3)

## 2024-03-04 LAB — IGG, IGA, IGM
IgA: 206 mg/dL (ref 87–352)
IgG (Immunoglobin G), Serum: 1266 mg/dL (ref 586–1602)
IgM (Immunoglobulin M), Srm: 147 mg/dL (ref 26–217)

## 2024-03-04 LAB — BETA 2 MICROGLOBULIN, SERUM: Beta-2 Microglobulin: 2.4 mg/L (ref 0.6–2.4)

## 2024-03-10 ENCOUNTER — Inpatient Hospital Stay (HOSPITAL_BASED_OUTPATIENT_CLINIC_OR_DEPARTMENT_OTHER): Payer: BC Managed Care – PPO | Admitting: Internal Medicine

## 2024-03-10 VITALS — BP 136/73 | HR 83 | Temp 97.9°F | Resp 16 | Ht 67.0 in | Wt 174.5 lb

## 2024-03-10 DIAGNOSIS — D509 Iron deficiency anemia, unspecified: Secondary | ICD-10-CM | POA: Diagnosis not present

## 2024-03-10 DIAGNOSIS — I10 Essential (primary) hypertension: Secondary | ICD-10-CM | POA: Diagnosis not present

## 2024-03-10 DIAGNOSIS — D563 Thalassemia minor: Secondary | ICD-10-CM | POA: Diagnosis not present

## 2024-03-10 DIAGNOSIS — D472 Monoclonal gammopathy: Secondary | ICD-10-CM | POA: Diagnosis not present

## 2024-03-10 DIAGNOSIS — R7989 Other specified abnormal findings of blood chemistry: Secondary | ICD-10-CM | POA: Diagnosis not present

## 2024-03-10 DIAGNOSIS — N920 Excessive and frequent menstruation with regular cycle: Secondary | ICD-10-CM | POA: Diagnosis not present

## 2024-03-10 DIAGNOSIS — Z79899 Other long term (current) drug therapy: Secondary | ICD-10-CM | POA: Diagnosis not present

## 2024-03-10 NOTE — Progress Notes (Signed)
 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  PRIOR THERAPY: Feraheme  infusion on as-needed basis  CURRENT THERAPY: None.  INTERVAL HISTORY: Jessica [name] 53 y.o. female returns to the clinic today for follow-up visit. Discussed the use of AI scribe software for clinical note transcription with the patient, who gave verbal consent to proceed.  History of Present Illness   Jessica [name] is a 53 year old female with microcytic hypochromic anemia secondary to iron deficiency and beta-thalassemia minor and MGUS who presents for evaluation and repeat blood work.  She has a history of microcytic hypochromic anemia secondary to iron deficiency, attributed to menorrhagia. Previously treated with iron infusions using Feraheme , she is currently under observation. No current symptoms of fatigue or dizziness are present, and she has no cravings for ice, which can be associated with iron deficiency.  She also has a diagnosis of beta-thalassemia minor, contributing to her anemia. Her hemoglobin levels have been consistently low, with a recent measurement of 10.2, slightly decreased from 10.5 in May of the previous year. Despite this, she feels well and is not experiencing any symptoms related to her anemia.  Additionally, she has a history of monoclonal gammopathy of undetermined significance (MGUS), which remains stable with no changes noted in recent protein studies.  Her kidney function is being monitored due to a recent increase in serum creatinine levels from 1.09 to 1.43. She has high blood pressure, which may need better control to manage her kidney function.  She is currently taking  green chlorophyll and an iron pill as part of her regimen.         MEDICAL HISTORY: Past Medical History:  Diagnosis Date   Allergy    seasonal   Anemia    Anxiety    Depression    HYPERCHOLESTEROLEMIA 10/07/2007   Qualifier: Diagnosis of  By: Marilyn Shropshire     Hyperlipidemia    MGUS (monoclonal gammopathy of unknown significance)     ALLERGIES:  is allergic to sulfonamide derivatives.  MEDICATIONS:  Current Outpatient Medications  Medication Sig Dispense Refill   ALPRAZolam  (XANAX ) 0.25 MG tablet Take 0.125 - 0.25mg  (1/2 to 1 tab) for anxiety up to twice a day as needed, if Buspar  ineffective. 30 tablet 1   atorvastatin  (LIPITOR) 40 MG tablet Take 1 tablet (40 mg total) by mouth daily. 90 tablet 3   busPIRone  (BUSPAR ) 5 MG tablet Take 5mg  (1 tablet)  as needed for anxiety up to 3 times in 1 day. 270 tablet 1   hydrochlorothiazide  (HYDRODIURIL ) 25 MG tablet Take 1 tablet (25 mg total) by mouth daily. 90 tablet 3   meloxicam  (MOBIC ) 15 MG tablet Take 1 tablet (15 mg total) by mouth daily. 90 tablet 1   methocarbamol  (ROBAXIN -750) 750 MG tablet Take 1 tablet (750 mg total) by mouth every 8 (eight) hours as needed for muscle spasms. 90 tablet 2   pantoprazole  (PROTONIX ) 40 MG tablet Take 1 tablet (40 mg total) by mouth daily. Take for at least 30 days for bloating and reflux. 30 tablet 3   predniSONE  (DELTASONE ) 20 MG tablet Take 60mg  PO daily x 2 days, then 40mg  PO daily x 5 days, then 20mg  PO daily x 5 days 21 tablet 0  traZODone  (DESYREL ) 50 MG tablet TAKE 1/2 TO 1 TABLET BY MOUTH AT BEDTIME AS NEEDED FOR SLEEP 90 tablet 2   venlafaxine  XR (EFFEXOR -XR) 75 MG 24 hr capsule Take 1 capsule (75 mg total) by mouth daily with breakfast. Take 1 tablet (75mg ) every other day alternating with 150mg  venlafaxine  for 14 days then stop 150mg  dose and only take 75mg  dose daily. 90 capsule 1   Vitamin D , Ergocalciferol , (DRISDOL ) 1.25 MG (50000 UNIT) CAPS capsule TAKE 1 CAPSULE (50,000 UNITS  TOTAL) BY MOUTH EVERY 7 (SEVEN) DAYS 4 capsule 2   No current facility-administered medications for this visit.    SURGICAL HISTORY:  Past Surgical History:  Procedure Laterality Date   TUBAL LIGATION      REVIEW OF SYSTEMS:  A comprehensive review of systems was negative.   PHYSICAL EXAMINATION: General appearance: alert, cooperative, fatigued, and no distress Head: Normocephalic, without obvious abnormality, atraumatic Neck: no adenopathy, no JVD, supple, symmetrical, trachea midline, and thyroid  not enlarged, symmetric, no tenderness/mass/nodules Lymph nodes: Cervical, supraclavicular, and axillary nodes normal. Resp: clear to auscultation bilaterally Back: symmetric, no curvature. ROM normal. No CVA tenderness. Cardio: regular rate and rhythm, S1, S2 normal, no murmur, click, rub or gallop GI: soft, non-tender; bowel sounds normal; no masses,  no organomegaly Extremities: extremities normal, atraumatic, no cyanosis or edema  ECOG PERFORMANCE STATUS: 1 - Symptomatic but completely ambulatory  Blood pressure 136/73, pulse 83, temperature 97.9 F (36.6 C), temperature source Temporal, resp. rate 16, height 5\' 7"  (1.702 m), weight 174 lb 8 oz (79.2 kg), SpO2 100%.  LABORATORY DATA: Lab Results  Component Value Date   WBC 7.1 03/03/2024   HGB 10.2 (L) 03/03/2024   HCT 33.0 (L) 03/03/2024   MCV 72.7 (L) 03/03/2024   PLT 310 03/03/2024      Chemistry      Component Value Date/Time   NA 143 03/03/2024 0849   NA 142 01/23/2023 1059   K 3.6 03/03/2024 0849   CL 107 03/03/2024 0849   CO2 29 03/03/2024 0849   BUN 20 03/03/2024 0849   BUN 19 01/23/2023 1059   CREATININE 1.43 (H) 03/03/2024 0849   CREATININE 0.98 09/10/2017 1333      Component Value Date/Time   CALCIUM  9.4 03/03/2024 0849   ALKPHOS 73 03/03/2024 0849   AST 20 03/03/2024 0849   ALT 21 03/03/2024 0849   BILITOT 0.4 03/03/2024 0849       RADIOGRAPHIC STUDIES: MM 3D DIAGNOSTIC MAMMOGRAM UNILATERAL  RIGHT BREAST Result Date: 02/25/2024 CLINICAL DATA:  Patient was recalled from screening mammogram for a possible asymmetry in the right breast. EXAM: DIGITAL DIAGNOSTIC UNILATERAL RIGHT MAMMOGRAM WITH TOMOSYNTHESIS AND CAD; ULTRASOUND RIGHT BREAST LIMITED TECHNIQUE: Right digital diagnostic mammography and breast tomosynthesis was performed. The images were evaluated with computer-aided detection. ; Targeted ultrasound examination of the right breast was performed COMPARISON:  Previous exam(s). ACR Breast Density Category d: The breasts are extremely dense, which lowers the sensitivity of mammography. FINDINGS: Additional imaging of the right breast was performed. There are well-circumscribed masses in the lateral aspect of the breast. There is no distortion or malignant type microcalcifications. Targeted ultrasound is performed, showing a well-circumscribed anechoic cyst in the right breast at 9 o'clock 2 cm from the nipple measuring 8 and 9 mm. No solid mass or abnormal shadowing detected. IMPRESSION: Right breast cysts.  No evidence of malignancy in the right breast. RECOMMENDATION: Bilateral screening mammogram in 1 year is recommended. I have discussed the findings and recommendations  with the patient. If applicable, a reminder letter will be sent to the patient regarding the next appointment. BI-RADS CATEGORY  2: Benign. Electronically Signed   By: Dina  Arceo M.D.   On: 02/25/2024 12:05   US  LIMITED ULTRASOUND INCLUDING AXILLA RIGHT BREAST Result Date: 02/25/2024 CLINICAL DATA:  Patient was recalled from screening mammogram for a possible asymmetry in the right breast. EXAM: DIGITAL DIAGNOSTIC UNILATERAL RIGHT MAMMOGRAM WITH TOMOSYNTHESIS AND CAD; ULTRASOUND RIGHT BREAST LIMITED TECHNIQUE: Right digital diagnostic mammography and breast tomosynthesis was performed. The images were evaluated with computer-aided detection. ; Targeted ultrasound examination of the right breast was performed COMPARISON:   Previous exam(s). ACR Breast Density Category d: The breasts are extremely dense, which lowers the sensitivity of mammography. FINDINGS: Additional imaging of the right breast was performed. There are well-circumscribed masses in the lateral aspect of the breast. There is no distortion or malignant type microcalcifications. Targeted ultrasound is performed, showing a well-circumscribed anechoic cyst in the right breast at 9 o'clock 2 cm from the nipple measuring 8 and 9 mm. No solid mass or abnormal shadowing detected. IMPRESSION: Right breast cysts.  No evidence of malignancy in the right breast. RECOMMENDATION: Bilateral screening mammogram in 1 year is recommended. I have discussed the findings and recommendations with the patient. If applicable, a reminder letter will be sent to the patient regarding the next appointment. BI-RADS CATEGORY  2: Benign. Electronically Signed   By: Dina  Arceo M.D.   On: 02/25/2024 12:05   MM 3D SCREENING MAMMOGRAM BILATERAL BREAST Result Date: 02/15/2024 CLINICAL DATA:  Screening. History of cysts EXAM: DIGITAL SCREENING BILATERAL MAMMOGRAM WITH TOMOSYNTHESIS AND CAD TECHNIQUE: Bilateral screening digital craniocaudal and mediolateral oblique mammograms were obtained. Bilateral screening digital breast tomosynthesis was performed. The images were evaluated with computer-aided detection. COMPARISON:  Previous exam(s). ACR Breast Density Category d: The breasts are extremely dense, which lowers the sensitivity of mammography. FINDINGS: In the right breast, a possible asymmetry warrants further evaluation. In the left breast, no findings suspicious for malignancy. IMPRESSION: Further evaluation is suggested for possible asymmetry in the right breast. RECOMMENDATION: Diagnostic mammogram and possibly ultrasound of the right breast. (Code:[id number]) The patient will be contacted regarding the findings, and additional imaging will be scheduled. BI-RADS CATEGORY  0: Incomplete: Need  additional imaging evaluation. Electronically Signed   By: Clancy Crimes M.D.   On: 02/15/2024 16:17    ASSESSMENT AND PLAN: This is a very pleasant 53 years old African-American female presented for evaluation of microcytic hypochromic anemia likely secondary to beta thalassemia minor as well as iron deficiency.   She was previously treated with Feraheme  infusion.   She does not take her oral iron tablet at regular basis because of constipation.    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 - Continue observation. - Omit myeloma panel at next visit.  Microcytic hypochromic anemia due to beta-thalassemia minor Anemia persists with hemoglobin at 10.2, slightly decreased from 10.5 in May of last year. No iron deficiency present. Anemia attributed to beta-thalassemia minor, a genetic condition causing chronic low hemoglobin levels. Iron levels are stable, and no iron infusion is needed. - Continue current management without iron infusion. - Monitor hemoglobin levels every six months.  Elevated serum creatinine Serum creatinine increased to 1.43 from 1.09, indicating a decline in kidney function. Increased hydration advised to potentially improve kidney function. - Increase hydration.  Hypertension Hypertension may contribute to elevated serum creatinine. Better blood pressure  control advised to prevent further kidney function decline. - Improve blood pressure control.   The patient was advised to call immediately if she has any other concerning symptoms in the interval. The patient voices understanding of current disease status and treatment options and is in agreement with the current care plan.  All questions were answered. The patient knows to call the clinic with any problems, questions or concerns. We can certainly see the patient much sooner if necessary.  Disclaimer: This note was dictated with voice  recognition software. Similar sounding words can inadvertently be transcribed and may not be corrected upon review.

## 2024-03-11 ENCOUNTER — Telehealth: Payer: Self-pay | Admitting: Internal Medicine

## 2024-03-11 NOTE — Telephone Encounter (Signed)
 Scheduled appointments per LOS note. Left the patient a voicemail with appointment details. The patient will be mailed an appointment reminder.

## 2024-03-11 NOTE — Telephone Encounter (Signed)
 Rescheduled appointments per the patient request. The patient is aware of the appointment changes.

## 2024-04-01 ENCOUNTER — Other Ambulatory Visit: Payer: Self-pay | Admitting: Nurse Practitioner

## 2024-04-01 NOTE — Telephone Encounter (Signed)
 Last apt 01/29/24 last Vit D check 01/25/23 does pt. Need to remain on this?

## 2024-05-20 ENCOUNTER — Ambulatory Visit: Admitting: Nurse Practitioner

## 2024-05-20 VITALS — BP 132/84 | HR 82 | Ht 66.0 in | Wt 175.0 lb

## 2024-05-20 DIAGNOSIS — N951 Menopausal and female climacteric states: Secondary | ICD-10-CM | POA: Diagnosis not present

## 2024-05-20 DIAGNOSIS — E559 Vitamin D deficiency, unspecified: Secondary | ICD-10-CM | POA: Diagnosis not present

## 2024-05-20 DIAGNOSIS — F429 Obsessive-compulsive disorder, unspecified: Secondary | ICD-10-CM

## 2024-05-20 DIAGNOSIS — D563 Thalassemia minor: Secondary | ICD-10-CM

## 2024-05-20 DIAGNOSIS — E782 Mixed hyperlipidemia: Secondary | ICD-10-CM | POA: Diagnosis not present

## 2024-05-20 DIAGNOSIS — D472 Monoclonal gammopathy: Secondary | ICD-10-CM

## 2024-05-20 DIAGNOSIS — F331 Major depressive disorder, recurrent, moderate: Secondary | ICD-10-CM | POA: Diagnosis not present

## 2024-05-20 DIAGNOSIS — Z0000 Encounter for general adult medical examination without abnormal findings: Secondary | ICD-10-CM

## 2024-05-20 DIAGNOSIS — Z789 Other specified health status: Secondary | ICD-10-CM

## 2024-05-20 DIAGNOSIS — I10 Essential (primary) hypertension: Secondary | ICD-10-CM | POA: Diagnosis not present

## 2024-05-20 MED ORDER — BUPROPION HCL ER (XL) 150 MG PO TB24: 150.0000 mg | ORAL_TABLET | Freq: Every day | ORAL | 12 refills | Status: DC

## 2024-05-20 NOTE — Progress Notes (Signed)
 Jessica Doing, DNP, AGNP-c [facility] [address] [place], [place] [address] Main Office [phone]  BP 132/84   Pulse 82   Ht 5' 6 (1.676 m)   Wt 175 lb (79.4 kg)   BMI 28.25 kg/m    Subjective:    Patient ID: Jessica [name], female    DOB: 06-May-1971, 53 y.o.   MRN: [id number]  HPI: Jessica [name] is a 53 y.o. female presenting on 05/20/2024 for comprehensive medical examination. She has additional concerns today about halitosis and anxiety.  Given her significant concerns, we have elected to postpone her annual exam and focus on other issues.   She experiences heightened anxiety, describing it as 'ramped up more than it should be.' Despite taking Effexor  (venlafaxine ) 75 mg, there has been no significant change in her anxiety levels since the dosage adjustment. She has been on this medication for a long time and has not tried other medications for anxiety.  She is significantly concerned about her breath, believing it exacerbates her anxiety. Despite reassurance from her dentist and an ENT specialist who found no issues, she remains self-conscious about her breath, especially in social situations. This concern has impacted her social interactions and quality of life, leading her to avoid certain situations and feel anxious around others. She recalls past incidents where she perceived others reacting to her breath, reinforcing her anxiety.  She has a history of depression and anxiety, which she describes as a 'great liar' that tells her negative things about herself. She has previously contemplated suicide and attended behavioral health therapy, including group therapy. Her current medication no longer seems effective for her anxiety and depression.  She is currently experiencing menopause, with symptoms including hot flashes and bloating, although irritability has decreased. She notes a decrease in libido, which she attributes to both menopause and her current  medication. She has gained weight, which she attributes to stress and depression, and has recently started to address her eating habits and exercise more.  She has a supportive family, including her husband and daughter, who also experiences anxiety and depression. She feels guilty about her daughter's condition, fearing she may have contributed to it. She is actively involved in her granddaughter's care, which she finds helpful.  Next Pap due after 06/15/2024 Last seen 04/01/2024 for f/u anxiety and new onset low back pain Last labs 03/10/2024   Pertinent items are noted in HPI.   Most Recent Depression Screen:     05/20/2024    9:58 AM 02/19/2023   11:16 AM 01/23/2023    9:35 AM 08/02/2022    3:47 PM 01/19/2022    9:21 AM  Depression screen PHQ 2/9  Decreased Interest 1 1 1  0 1  Down, Depressed, Hopeless 2 3 2  0 2  PHQ - 2 Score 3 4 3  0 3  Altered sleeping 0 0 0  0  Tired, decreased energy 0 0 0  2  Change in appetite 2 1 1  2   Feeling bad or failure about yourself  1 1 3  2   Trouble concentrating 3 1 3  1   Moving slowly or fidgety/restless 0 0 0  1  Suicidal thoughts 0 0 0  1  PHQ-9 Score 9 7 10  12   Difficult [name] work/chores Somewhat difficult Somewhat difficult Somewhat difficult  Very difficult   Most Recent Anxiety Screen:     05/20/2024   10:00 AM 02/19/2023   11:17 AM 01/19/2022    9:22 AM 09/10/2019    9:22  AM  GAD 7 : Generalized Anxiety Score  Nervous, Anxious, on Edge 3 3 2 1   Control/stop worrying 3 3 1 1   Worry too much - different things 3 3 1 1   Trouble relaxing 1 3 1 1   Restless 1 1 1  0  Easily annoyed or irritable 0 0 1 1  Afraid - awful might happen 0 0 2 1  Total GAD 7 Score 11 13 9 6   Anxiety Difficulty Somewhat difficult Somewhat difficult Very difficult Not difficult at all   Most Recent Fall Screen:    05/20/2024    9:58 AM 01/23/2023    9:35 AM 08/02/2022    3:47 PM 01/19/2022    9:01 AM 03/07/2021   10:55 AM  Fall Risk   Falls in the past year? 0 0  0 1 0  Number falls in past yr: 0 0 0 0 0  Injury with Fall? 0 0 0 1 0  Comment    pt has a bruise on upper left arm   Risk for fall due to : No Fall Risks No Fall Risks No Fall Risks  No Fall Risks  Follow up Falls evaluation completed Falls evaluation completed Falls evaluation completed  Falls evaluation completed;Education provided  Falls evaluation completed      Data saved with a previous flowsheet row definition    Past medical history, surgical history, medications, allergies, family history and social history reviewed with patient today and changes made to appropriate areas of the chart.  Past Medical History:  Past Medical History:  Diagnosis Date   Abdominal bloating 01/29/2024   Acute left-sided thoracic back pain 01/29/2024   Allergy    seasonal   Anemia    Anxiety    Depression    HYPERCHOLESTEROLEMIA 10/07/2007   Qualifier: Diagnosis of  By: Jacqueline Riggs     Hyperlipidemia    MGUS (monoclonal gammopathy of unknown significance)    Medications:  Current Outpatient Medications on File Prior to Visit  Medication Sig   ALPRAZolam  (XANAX ) 0.25 MG tablet Take 0.125 - 0.25mg  (1/2 to 1 tab) for anxiety up to twice a day as needed, if Buspar  ineffective.   atorvastatin  (LIPITOR) 40 MG tablet Take 1 tablet (40 mg total) by mouth daily.   busPIRone  (BUSPAR ) 5 MG tablet Take 5mg  (1 tablet)  as needed for anxiety up to 3 times in 1 day.   hydrochlorothiazide  (HYDRODIURIL ) 25 MG tablet Take 1 tablet (25 mg total) by mouth daily.   meloxicam  (MOBIC ) 15 MG tablet Take 1 tablet (15 mg total) by mouth daily. (Patient taking differently: Take 15 mg by mouth daily. PRN)   methocarbamol  (ROBAXIN -750) 750 MG tablet Take 1 tablet (750 mg total) by mouth every 8 (eight) hours as needed for muscle spasms.   pantoprazole  (PROTONIX ) 40 MG tablet Take 1 tablet (40 mg total) by mouth daily. Take for at least 30 days for bloating and reflux.   traZODone  (DESYREL ) 50 MG tablet TAKE 1/2 TO 1  TABLET BY MOUTH AT BEDTIME AS NEEDED FOR SLEEP   venlafaxine  XR (EFFEXOR -XR) 75 MG 24 hr capsule Take 1 capsule (75 mg total) by mouth daily with breakfast. Take 1 tablet (75mg ) every other day alternating with 150mg  venlafaxine  for 14 days then stop 150mg  dose and only take 75mg  dose daily.   Vitamin D , Ergocalciferol , (DRISDOL ) 1.25 MG (50000 UNIT) CAPS capsule TAKE 1 CAPSULE (50,000 UNITS TOTAL) BY MOUTH EVERY 7 (SEVEN) DAYS   predniSONE  (DELTASONE ) 20 MG tablet  Take 60mg  PO daily x 2 days, then 40mg  PO daily x 5 days, then 20mg  PO daily x 5 days (Patient not taking: Reported on 05/20/2024)   No current facility-administered medications on file prior to visit.   Surgical History:  Past Surgical History:  Procedure Laterality Date   TUBAL LIGATION     Allergies:  Allergies  Allergen Reactions   Sulfonamide Derivatives     REACTION: hives   Family History:  Family History  Problem Relation Age of Onset   Heart disease Mother 22       MI   Hyperlipidemia Mother    Hypertension Mother    Alcohol abuse Father    Drug abuse Father    Hyperlipidemia Father    Hypertension Father    Alcohol abuse Maternal Uncle    Colon polyps Maternal Grandmother    Heart disease Maternal Grandmother    Hypertension Maternal Grandmother    Hyperlipidemia Maternal Grandmother    Cancer Paternal Grandmother    Diabetes Paternal Grandmother    Hypertension Paternal Grandmother    Cancer Paternal Grandfather    Breast cancer Neg Hx    Colon cancer Neg Hx    Esophageal cancer Neg Hx    Stomach cancer Neg Hx    Rectal cancer Neg Hx        Objective:    BP 132/84   Pulse 82   Ht 5' 6 (1.676 m)   Wt 175 lb (79.4 kg)   BMI 28.25 kg/m   Wt Readings from Last 3 Encounters:  05/20/24 175 lb (79.4 kg)  03/10/24 174 lb 8 oz (79.2 kg)  01/29/24 170 lb 3.2 oz (77.2 kg)    Physical Exam Vitals and nursing note reviewed.  Constitutional:      General: She is not in acute distress.     Appearance: Normal appearance.  HENT:     Head: Normocephalic and atraumatic.     Right Ear: Hearing, tympanic membrane, ear canal and external ear normal.     Left Ear: Hearing, tympanic membrane, ear canal and external ear normal.     Nose: Nose normal.     Right Sinus: No maxillary sinus tenderness or frontal sinus tenderness.     Left Sinus: No maxillary sinus tenderness or frontal sinus tenderness.     Mouth/Throat:     Lips: Pink.     Mouth: Mucous membranes are moist.     Pharynx: Oropharynx is clear.     Comments: No halitosis noted.  Eyes:     General: Lids are normal. Vision grossly intact.     Extraocular Movements: Extraocular movements intact.     Conjunctiva/sclera: Conjunctivae normal.     Pupils: Pupils are equal, round, and reactive to light.     Funduscopic exam:    Right eye: Red reflex present.        Left eye: Red reflex present.    Visual Fields: Right eye visual fields normal and left eye visual fields normal.  Neck:     Thyroid : No thyromegaly.     Vascular: No carotid bruit.  Cardiovascular:     Rate and Rhythm: Normal rate and regular rhythm.     Chest Wall: PMI is not displaced.     Pulses: Normal pulses.          Dorsalis pedis pulses are 2+ on the right side and 2+ on the left side.       Posterior tibial pulses are 2+ on the  right side and 2+ on the left side.     Heart sounds: Normal heart sounds. No murmur heard. Pulmonary:     Effort: Pulmonary effort is normal. No respiratory distress.     Breath sounds: Normal breath sounds.  Abdominal:     General: Abdomen is flat. Bowel sounds are normal. There is no distension.     Palpations: Abdomen is soft. There is no hepatomegaly, splenomegaly or mass.     Tenderness: There is no abdominal tenderness. There is no right CVA tenderness, left CVA tenderness, guarding or rebound.  Musculoskeletal:        General: Normal range of motion.     Cervical back: Full passive range of motion without pain, normal  range of motion and neck supple. No tenderness.     Right lower leg: No edema.     Left lower leg: No edema.  Feet:     Left foot:     Toenail Condition: Left toenails are normal.  Lymphadenopathy:     Cervical: No cervical adenopathy.     Upper Body:     Right upper body: No supraclavicular adenopathy.     Left upper body: No supraclavicular adenopathy.  Skin:    General: Skin is warm and dry.     Capillary Refill: Capillary refill takes less than 2 seconds.     Nails: There is no clubbing.  Neurological:     General: No focal deficit present.     Mental Status: She is alert and oriented to person, place, and time.     GCS: GCS eye subscore is 4. GCS verbal subscore is 5. GCS motor subscore is 6.     Sensory: Sensation is intact.     Motor: Motor function is intact.     Coordination: Coordination is intact.     Gait: Gait is intact.     Deep Tendon Reflexes: Reflexes are normal and symmetric.  Psychiatric:        Attention and Perception: Attention normal.        Mood and Affect: Mood normal.        Speech: Speech normal.        Behavior: Behavior normal. Behavior is cooperative.        Thought Content: Thought content normal.        Cognition and Memory: Cognition and memory normal.        Judgment: Judgment normal.      Results for orders placed or performed in visit on 05/20/24  Hemoglobin A1c   Collection Time: 05/20/24 10:53 AM  Result Value Ref Range   Hgb A1c MFr Bld 5.9 (H) 4.8 - 5.6 %   Est. average glucose Bld gHb Est-mCnc 123 mg/dL  Comprehensive metabolic panel with GFR   Collection Time: 05/20/24 10:53 AM  Result Value Ref Range   Glucose 96 70 - 99 mg/dL   BUN 26 (H) 6 - 24 mg/dL   Creatinine, Ser 8.36 (H) 0.57 - 1.00 mg/dL   eGFR 37 (L) >40 fO/fpw/8.26   BUN/Creatinine Ratio 16 9 - 23   Sodium 140 134 - 144 mmol/L   Potassium 5.0 3.5 - 5.2 mmol/L   Chloride 101 96 - 106 mmol/L   CO2 22 20 - 29 mmol/L   Calcium  10.5 (H) 8.7 - 10.2 mg/dL   Total  Protein 8.1 6.0 - 8.5 g/dL   Albumin 4.6 3.8 - 4.9 g/dL   Globulin, Total 3.5 1.5 - 4.5 g/dL   Bilirubin Total  0.5 0.0 - 1.2 mg/dL   Alkaline Phosphatase 108 44 - 121 IU/L   AST 25 0 - 40 IU/L   ALT 18 0 - 32 IU/L  VITAMIN D  25 Hydroxy (Vit-D Deficiency, Fractures)   Collection Time: 05/20/24 10:53 AM  Result Value Ref Range   Vit D, 25-Hydroxy 67.9 30.0 - 100.0 ng/mL  Lipid panel   Collection Time: 05/20/24 10:53 AM  Result Value Ref Range   Cholesterol, Total 223 (H) 100 - 199 mg/dL   Triglycerides 57 0 - 149 mg/dL   HDL 77 >60 mg/dL   VLDL Cholesterol Cal 10 5 - 40 mg/dL   LDL Chol Calc (NIH) 863 (H) 0 - 99 mg/dL   Chol/HDL Ratio 2.9 0.0 - 4.4 ratio  Testosterone , Total, LC/MS/MS   Collection Time: 05/20/24 10:53 AM  Result Value Ref Range   Testosterone , total 13.1 ng/dL  Estradiol    Collection Time: 05/20/24 10:53 AM  Result Value Ref Range   Estradiol  24.4 pg/mL  FSH/LH   Collection Time: 05/20/24 10:53 AM  Result Value Ref Range   LH 74.8 mIU/mL   FSH 135.0 mIU/mL       Assessment & Plan:   Problem List Items Addressed This Visit     Major depressive disorder, recurrent episode, moderate (HCC) - Primary   Chronic depression and anxiety with increased severity despite venlafaxine  75 mg. Significant anxiety related to self-consciousness about breath, impacting social interactions and quality of life. History of suicidal ideation and previous group therapy. Cognitive behavioral therapy (CBT) suggested to retrain thought processes and reduce hyperfocus on perceived issues. Consideration of Wellbutrin  to address obsessive thinking and potentially improve energy and libido. - Initiate cognitive behavioral therapy (CBT) - Continue venlafaxine  75 mg - Start Wellbutrin  to address obsessive thinking and potentially improve energy and libido      Relevant Medications   buPROPion  (WELLBUTRIN  XL) 150 MG 24 hr tablet   Obsessive compulsive disorder   Chronic anxiety and  obsessive thoughts with increased severity despite venlafaxine  75 mg. Significant anxiety related to self-consciousness about breath, impacting social interactions and quality of life. History of suicidal ideation and previous group therapy. Cognitive behavioral therapy (CBT) suggested to retrain thought processes and reduce hyperfocus on perceived issues. Consideration of Wellbutrin  to address obsessive thinking and potentially improve energy and libido. - Initiate cognitive behavioral therapy (CBT) - Continue venlafaxine  75 mg - Start Wellbutrin  to address obsessive thinking and potentially improve energy and libido      Beta thalassemia minor   MGUS (monoclonal gammopathy of unknown significance)   Mixed hyperlipidemia   Currently managed with atorvastatin  with no concerns. Continue current regimen. Labs pending.       Relevant Orders   Hemoglobin A1c (Completed)   Comprehensive metabolic panel with GFR (Completed)   VITAMIN D  25 Hydroxy (Vit-D Deficiency, Fractures) (Completed)   Lipid panel (Completed)   Essential hypertension   BP slightly elevated today. Managed with hydrochlorothiazide . Recommend at home monitoring of BP and letting me know if the numbers are consistently higher than 130/80. Will plan to adjust medication if elevated readings continue.       Relevant Orders   Hemoglobin A1c (Completed)   Comprehensive metabolic panel with GFR (Completed)   VITAMIN D  25 Hydroxy (Vit-D Deficiency, Fractures) (Completed)   Lipid panel (Completed)   Perimenopause   Experiencing hot flashes, bloating, and decreased libido, possibly exacerbated by venlafaxine . Menopause may contribute to mood changes and anxiety. Venlafaxine  typically used to manage mood changes  and hot flashes associated with menopause, but may not be effective for her current symptoms. - Check hormone levels to assess menopausal status and potential contribution to symptoms      Relevant Orders   Testosterone ,  Total, LC/MS/MS (Completed)   Estradiol  (Completed)   FSH/LH (Completed)   Health belief conflict   Persistent concern about bad breath despite no findings from dental or ENT evaluations. No halitosis noted on examination today or previously. Given her strong belief that this is present, discussed possible gastrointestinal etiology, such as reflux without typical symptoms. Highly self-conscious about this issue, affecting social life and mental health. Discussed possibility that the issue may not be detectable by others and could be related to her own olfactory perception. - Refer to GI specialist for evaluation of potential underlying causes of halitosis       Relevant Orders   Ambulatory referral to Gastroenterology   Vitamin D  deficiency   Relevant Orders   Hemoglobin A1c (Completed)   Comprehensive metabolic panel with GFR (Completed)   VITAMIN D  25 Hydroxy (Vit-D Deficiency, Fractures) (Completed)   Lipid panel (Completed)    Time: 56 minutes, >50% spent counseling, care coordination, chart review, and documentation.    Follow up plan: Return for please schedule pap and physical after 06/15/24.  NEXT PREVENTATIVE PHYSICAL DUE IN 1 YEAR.  PATIENT COUNSELING PROVIDED FOR ALL ADULT PATIENTS: A well balanced diet low in saturated fats, cholesterol, and moderation in carbohydrates.  This can be as simple as monitoring portion sizes and cutting back on sugary beverages such as soda and juice to start with.    Daily water consumption of at least 64 ounces.  Physical activity at least 180 minutes per week.  If just starting out, start 10 minutes a day and work your way up.   This can be as simple as taking the stairs instead of the elevator and walking 2-3 laps around the office  purposefully every day.   STD protection, partner selection, and regular testing if high risk.  Limited consumption of alcoholic beverages if alcohol is consumed. For men, I recommend no more than 14  alcoholic beverages per week, spread out throughout the week (max 2 per day). Avoid binge drinking or consuming large quantities of alcohol in one setting.  Please let me know if you feel you may need help with reduction or quitting alcohol consumption.   Avoidance of nicotine, if used. Please let me know if you feel you may need help with reduction or quitting nicotine use.   Daily mental health attention. This can be in the form of 5 minute daily meditation, prayer, journaling, yoga, reflection, etc.  Purposeful attention to your emotions and mental state can significantly improve your overall wellbeing  and  Health.  Please know that I am here to help you with all of your health care goals and am happy to work with you to find a solution that works best for you.  The greatest advice I have received with any changes in life are to take it one step at a time, that even means if all you can focus on is the next 60 seconds, then do that and celebrate your victories.  With any changes in life, you will have set backs, and that is OK. The important thing to remember is, if you have a set back, it is not a failure, it is an opportunity to try again! Screening Testing Mammogram Every 1 -2 years based on history and risk  factors Starting at age 39 Pap Smear Ages 21-39 every 3 years Ages 38-65 every 5 years with HPV testing More frequent testing may be required based on results and history Colon Cancer Screening Every 1-10 years based on test performed, risk factors, and history Starting at age 46 Bone Density Screening Every 2-10 years based on history Starting at age 30 for women Recommendations for men differ based on medication usage, history, and risk factors AAA Screening One time ultrasound Men 23-22 years old who have every smoked Lung Cancer Screening Low Dose Lung CT every 12 months Age 48-80 years with a 30 pack-year smoking history who still smoke or who have quit within the  last 15 years   Screening Labs Routine  Labs: Complete Blood Count (CBC), Complete Metabolic Panel (CMP), Cholesterol (Lipid Panel) Every 6-12 months based on history and medications May be recommended more frequently based on current conditions or previous results Hemoglobin A1c Lab Every 3-12 months based on history and previous results Starting at age 52 or earlier with diagnosis of diabetes, high cholesterol, BMI >26, and/or risk factors Frequent monitoring for patients with diabetes to ensure blood sugar control Thyroid  Panel (TSH) Every 6 months based on history, symptoms, and risk factors May be repeated more often if on medication HIV One time testing for all patients 38 and older May be repeated more frequently for patients with increased risk factors or exposure Hepatitis C One time testing for all patients 68 and older May be repeated more frequently for patients with increased risk factors or exposure Gonorrhea, Chlamydia Every 12 months for all sexually active persons 13-24 years Additional monitoring may be recommended for those who are considered high risk or who have symptoms Every 12 months for any woman on birth control, regardless of sexual activity PSA Men 58-47 years old with risk factors Additional screening may be recommended from age 35-69 based on risk factors, symptoms, and history  Vaccine Recommendations Tetanus Booster All adults every 10 years Flu Vaccine All patients 6 months and older every year COVID Vaccine All patients 12 years and older Initial dosing with booster May recommend additional booster based on age and health history HPV Vaccine 2 doses all patients age 53-26 Dosing may be considered for patients over 26 Shingles Vaccine (Shingrix ) 2 doses all adults 55 years and older Pneumonia (Pneumovax 23) All adults 65 years and older May recommend earlier dosing based on health history One year apart from Prevnar 39 Pneumonia (Prevnar  47) All adults 65 years and older Dosed 1 year after Pneumovax 23 Pneumonia (Prevnar 20) One time alternative to the two dosing of 13 and 23 For all adults with initial dose of 23, 20 is recommended 1 year later For all adults with initial dose of 13, 23 is still recommended as second option 1 year later

## 2024-05-23 LAB — COMPREHENSIVE METABOLIC PANEL WITH GFR
ALT: 18 IU/L (ref 0–32)
AST: 25 IU/L (ref 0–40)
Albumin: 4.6 g/dL (ref 3.8–4.9)
Alkaline Phosphatase: 108 IU/L (ref 44–121)
BUN/Creatinine Ratio: 16 (ref 9–23)
BUN: 26 mg/dL — ABNORMAL HIGH (ref 6–24)
Bilirubin Total: 0.5 mg/dL (ref 0.0–1.2)
CO2: 22 mmol/L (ref 20–29)
Calcium: 10.5 mg/dL — ABNORMAL HIGH (ref 8.7–10.2)
Chloride: 101 mmol/L (ref 96–106)
Creatinine, Ser: 1.63 mg/dL — ABNORMAL HIGH (ref 0.57–1.00)
Globulin, Total: 3.5 g/dL (ref 1.5–4.5)
Glucose: 96 mg/dL (ref 70–99)
Potassium: 5 mmol/L (ref 3.5–5.2)
Sodium: 140 mmol/L (ref 134–144)
Total Protein: 8.1 g/dL (ref 6.0–8.5)
eGFR: 37 mL/min/1.73 — ABNORMAL LOW (ref >59)

## 2024-05-23 LAB — LIPID PANEL
Chol/HDL Ratio: 2.9 ratio (ref 0.0–4.4)
Cholesterol, Total: 223 mg/dL — ABNORMAL HIGH (ref 100–199)
HDL: 77 mg/dL (ref >39)
LDL Chol Calc (NIH): 136 mg/dL — ABNORMAL HIGH (ref 0–99)
Triglycerides: 57 mg/dL (ref 0–149)
VLDL Cholesterol Cal: 10 mg/dL (ref 5–40)

## 2024-05-23 LAB — FSH/LH
FSH: 135 m[IU]/mL
LH: 74.8 m[IU]/mL

## 2024-05-23 LAB — VITAMIN D 25 HYDROXY (VIT D DEFICIENCY, FRACTURES): Vit D, 25-Hydroxy: 67.9 ng/mL (ref 30.0–100.0)

## 2024-05-23 LAB — HEMOGLOBIN A1C
Est. average glucose Bld gHb Est-mCnc: 123 mg/dL
Hgb A1c MFr Bld: 5.9 % — ABNORMAL HIGH (ref 4.8–5.6)

## 2024-05-23 LAB — ESTRADIOL: Estradiol: 24.4 pg/mL

## 2024-05-23 LAB — TESTOSTERONE, TOTAL, LC/MS/MS: Testosterone, total: 13.1 ng/dL

## 2024-06-04 ENCOUNTER — Ambulatory Visit: Payer: Self-pay | Admitting: Nurse Practitioner

## 2024-06-04 DIAGNOSIS — R944 Abnormal results of kidney function studies: Secondary | ICD-10-CM

## 2024-06-04 DIAGNOSIS — N926 Irregular menstruation, unspecified: Secondary | ICD-10-CM

## 2024-06-09 ENCOUNTER — Encounter: Payer: Self-pay | Admitting: Nurse Practitioner

## 2024-06-09 ENCOUNTER — Other Ambulatory Visit

## 2024-06-09 DIAGNOSIS — R944 Abnormal results of kidney function studies: Secondary | ICD-10-CM | POA: Diagnosis not present

## 2024-06-09 DIAGNOSIS — Z0000 Encounter for general adult medical examination without abnormal findings: Secondary | ICD-10-CM | POA: Insufficient documentation

## 2024-06-09 DIAGNOSIS — Z789 Other specified health status: Secondary | ICD-10-CM | POA: Insufficient documentation

## 2024-06-09 NOTE — Assessment & Plan Note (Deleted)
 General labs today. No concerns.

## 2024-06-09 NOTE — Assessment & Plan Note (Signed)
 Persistent concern about bad breath despite no findings from dental or ENT evaluations. No halitosis noted on examination today or previously. Given her strong belief that this is present, discussed possible gastrointestinal etiology, such as reflux without typical symptoms. Highly self-conscious about this issue, affecting social life and mental health. Discussed possibility that the issue may not be detectable by others and could be related to her own olfactory perception. - Refer to GI specialist for evaluation of potential underlying causes of halitosis

## 2024-06-09 NOTE — Assessment & Plan Note (Signed)
 Currently managed with atorvastatin  with no concerns. Continue current regimen. Labs pending.

## 2024-06-09 NOTE — Assessment & Plan Note (Signed)
 Chronic anxiety and obsessive thoughts with increased severity despite venlafaxine  75 mg. Significant anxiety related to self-consciousness about breath, impacting social interactions and quality of life. History of suicidal ideation and previous group therapy. Cognitive behavioral therapy (CBT) suggested to retrain thought processes and reduce hyperfocus on perceived issues. Consideration of Wellbutrin  to address obsessive thinking and potentially improve energy and libido. - Initiate cognitive behavioral therapy (CBT) - Continue venlafaxine  75 mg - Start Wellbutrin  to address obsessive thinking and potentially improve energy and libido

## 2024-06-09 NOTE — Assessment & Plan Note (Signed)
 Experiencing hot flashes, bloating, and decreased libido, possibly exacerbated by venlafaxine . Menopause may contribute to mood changes and anxiety. Venlafaxine  typically used to manage mood changes and hot flashes associated with menopause, but may not be effective for her current symptoms. - Check hormone levels to assess menopausal status and potential contribution to symptoms

## 2024-06-09 NOTE — Assessment & Plan Note (Signed)
 Chronic depression and anxiety with increased severity despite venlafaxine  75 mg. Significant anxiety related to self-consciousness about breath, impacting social interactions and quality of life. History of suicidal ideation and previous group therapy. Cognitive behavioral therapy (CBT) suggested to retrain thought processes and reduce hyperfocus on perceived issues. Consideration of Wellbutrin  to address obsessive thinking and potentially improve energy and libido. - Initiate cognitive behavioral therapy (CBT) - Continue venlafaxine  75 mg - Start Wellbutrin  to address obsessive thinking and potentially improve energy and libido

## 2024-06-09 NOTE — Assessment & Plan Note (Deleted)

## 2024-06-09 NOTE — Assessment & Plan Note (Signed)
 BP slightly elevated today. Managed with hydrochlorothiazide . Recommend at home monitoring of BP and letting me know if the numbers are consistently higher than 130/80. Will plan to adjust medication if elevated readings continue.

## 2024-06-09 NOTE — Assessment & Plan Note (Deleted)
 Repeat labs today. No concerns at this time.

## 2024-06-10 LAB — RENAL FUNCTION PANEL
Albumin: 4.3 g/dL (ref 3.8–4.9)
BUN/Creatinine Ratio: 16 (ref 9–23)
BUN: 25 mg/dL — ABNORMAL HIGH (ref 6–24)
CO2: 23 mmol/L (ref 20–29)
Calcium: 10.3 mg/dL — ABNORMAL HIGH (ref 8.7–10.2)
Chloride: 100 mmol/L (ref 96–106)
Creatinine, Ser: 1.61 mg/dL — ABNORMAL HIGH (ref 0.57–1.00)
Glucose: 108 mg/dL — ABNORMAL HIGH (ref 70–99)
Phosphorus: 3.9 mg/dL (ref 3.0–4.3)
Potassium: 5.2 mmol/L (ref 3.5–5.2)
Sodium: 140 mmol/L (ref 134–144)
eGFR: 38 mL/min/1.73 — ABNORMAL LOW (ref >59)

## 2024-06-11 ENCOUNTER — Other Ambulatory Visit: Payer: Self-pay | Admitting: Nurse Practitioner

## 2024-06-11 ENCOUNTER — Ambulatory Visit: Payer: Self-pay | Admitting: Nurse Practitioner

## 2024-06-11 DIAGNOSIS — F331 Major depressive disorder, recurrent, moderate: Secondary | ICD-10-CM

## 2024-06-11 DIAGNOSIS — E782 Mixed hyperlipidemia: Secondary | ICD-10-CM

## 2024-06-11 DIAGNOSIS — R944 Abnormal results of kidney function studies: Secondary | ICD-10-CM

## 2024-06-11 DIAGNOSIS — I10 Essential (primary) hypertension: Secondary | ICD-10-CM

## 2024-06-11 MED ORDER — AMLODIPINE-ATORVASTATIN 10-40 MG PO TABS: 1.0000 | ORAL_TABLET | Freq: Every day | ORAL | 3 refills | Status: DC

## 2024-06-11 MED ORDER — NORETHIN ACE-ETH ESTRAD-FE 1-20 MG-MCG PO TABS: 1.0000 | ORAL_TABLET | Freq: Every day | ORAL | 4 refills | Status: AC

## 2024-06-12 ENCOUNTER — Telehealth: Payer: Self-pay

## 2024-06-12 ENCOUNTER — Other Ambulatory Visit: Payer: Self-pay

## 2024-06-12 DIAGNOSIS — R944 Abnormal results of kidney function studies: Secondary | ICD-10-CM

## 2024-06-12 NOTE — Telephone Encounter (Signed)
 Referral put in to Nephrology and sent pt. MyChart message.   Copied from CRM [id number]. Topic: Referral - Status >> Jun 12, 2024  1:09 PM Fonda T wrote: Reason for CRM: Patient calling to check status of referral to nephrology/kidney specialist.  Patient States it was mentioned previously by provider, and wanted to follow up if that is still the plan for a referral to specialist.  Patient can be reached at [phone] To discuss further.   Thank you

## 2024-06-19 ENCOUNTER — Other Ambulatory Visit: Payer: Self-pay

## 2024-06-19 ENCOUNTER — Encounter: Admitting: Nurse Practitioner

## 2024-06-19 DIAGNOSIS — R944 Abnormal results of kidney function studies: Secondary | ICD-10-CM

## 2024-06-20 ENCOUNTER — Other Ambulatory Visit: Payer: Self-pay | Admitting: Nurse Practitioner

## 2024-06-20 ENCOUNTER — Encounter: Payer: Self-pay | Admitting: Pediatrics

## 2024-06-20 ENCOUNTER — Encounter: Payer: Self-pay | Admitting: Internal Medicine

## 2024-06-20 ENCOUNTER — Encounter: Payer: Self-pay | Admitting: Physician Assistant

## 2024-06-20 ENCOUNTER — Ambulatory Visit: Admitting: Physician Assistant

## 2024-06-20 ENCOUNTER — Other Ambulatory Visit

## 2024-06-20 VITALS — BP 136/72 | HR 96 | Ht 66.0 in | Wt 175.4 lb

## 2024-06-20 DIAGNOSIS — K219 Gastro-esophageal reflux disease without esophagitis: Secondary | ICD-10-CM | POA: Diagnosis not present

## 2024-06-20 DIAGNOSIS — K5904 Chronic idiopathic constipation: Secondary | ICD-10-CM

## 2024-06-20 DIAGNOSIS — N1832 Chronic kidney disease, stage 3b: Secondary | ICD-10-CM

## 2024-06-20 DIAGNOSIS — K5900 Constipation, unspecified: Secondary | ICD-10-CM

## 2024-06-20 DIAGNOSIS — D563 Thalassemia minor: Secondary | ICD-10-CM

## 2024-06-20 DIAGNOSIS — D509 Iron deficiency anemia, unspecified: Secondary | ICD-10-CM

## 2024-06-20 DIAGNOSIS — D472 Monoclonal gammopathy: Secondary | ICD-10-CM

## 2024-06-20 LAB — COMPREHENSIVE METABOLIC PANEL WITH GFR
ALT: 17 U/L (ref 0–35)
AST: 19 U/L (ref 0–37)
Albumin: 4.4 g/dL (ref 3.5–5.2)
Alkaline Phosphatase: 85 U/L (ref 39–117)
BUN: 17 mg/dL (ref 6–23)
CO2: 28 meq/L (ref 19–32)
Calcium: 9.7 mg/dL (ref 8.4–10.5)
Chloride: 102 meq/L (ref 96–112)
Creatinine, Ser: 1.36 mg/dL — ABNORMAL HIGH (ref 0.40–1.20)
GFR: 44.47 mL/min — ABNORMAL LOW (ref >60.00)
Glucose, Bld: 102 mg/dL — ABNORMAL HIGH (ref 70–99)
Potassium: 4.3 meq/L (ref 3.5–5.1)
Sodium: 139 meq/L (ref 135–145)
Total Bilirubin: 0.5 mg/dL (ref 0.2–1.2)
Total Protein: 8.3 g/dL (ref 6.0–8.3)

## 2024-06-20 LAB — CBC WITH DIFFERENTIAL/PLATELET
Basophils Absolute: 0.1 K/uL (ref 0.0–0.1)
Basophils Relative: 1.3 % (ref 0.0–3.0)
Eosinophils Absolute: 0 K/uL (ref 0.0–0.7)
Eosinophils Relative: 0.3 % (ref 0.0–5.0)
HCT: 31.7 % — ABNORMAL LOW (ref 36.0–46.0)
Hemoglobin: 10.1 g/dL — ABNORMAL LOW (ref 12.0–15.0)
Lymphocytes Relative: 21.1 % (ref 12.0–46.0)
Lymphs Abs: 1.7 K/uL (ref 0.7–4.0)
MCHC: 31.8 g/dL (ref 30.0–36.0)
MCV: 69.9 fl — ABNORMAL LOW (ref 78.0–100.0)
Monocytes Absolute: 0.6 K/uL (ref 0.1–1.0)
Monocytes Relative: 8 % (ref 3.0–12.0)
Neutro Abs: 5.6 K/uL (ref 1.4–7.7)
Neutrophils Relative %: 69.3 % (ref 43.0–77.0)
Platelets: 288 K/uL (ref 150.0–400.0)
RBC: 4.54 Mil/uL (ref 3.87–5.11)
RDW: 15.3 % (ref 11.5–15.5)
WBC: 8.1 K/uL (ref 4.0–10.5)

## 2024-06-20 MED ORDER — FAMOTIDINE 40 MG PO TABS: 40.0000 mg | ORAL_TABLET | Freq: Every day | ORAL | 0 refills | Status: DC

## 2024-06-20 MED ORDER — OMEPRAZOLE 40 MG PO CPDR: 40.0000 mg | DELAYED_RELEASE_CAPSULE | Freq: Every day | ORAL | 2 refills | Status: DC

## 2024-06-20 NOTE — Patient Instructions (Addendum)
 Your provider has requested that you go to the basement level for lab work before leaving today. Press B on the elevator. The lab is located at the first door on the left as you exit the elevator.  You have been scheduled for an endoscopy. Please follow written instructions given to you at your visit today.  If you use inhalers (even only as needed), please bring them with you on the day of your procedure.  If you take any of the following medications, they will need to be adjusted prior to your procedure:   DO NOT TAKE 7 DAYS PRIOR TO TEST- Trulicity (dulaglutide) Ozempic, Wegovy (semaglutide) Mounjaro (tirzepatide) Bydureon Bcise (exanatide extended release)  DO NOT TAKE 1 DAY PRIOR TO YOUR TEST Rybelsus (semaglutide) Adlyxin (lixisenatide) Victoza (liraglutide) Byetta (exanatide) ___________________________________________________________________________    Reflux Gourmet Rescue  It is an ALGINATE THERAPY which is the only intervention that works to safeguard the esophagus by creating a protective barrier that actually stops reflux from happening. -The general directions for use are as stated on the packaging: Take 1 teaspoon (5 ml), or more as needed or as directed by your physician, after meals and before bed. -These general directions address the most common times for reflux to occur, but our Rescue products may be taken anytime. Some individuals may take our product preemptively, when they know they will suffer from reflux, or as needed - when discomfort arises. (If taken around food, it should be consumed last.) -You do not have to take 1 teaspoon (5 ml) of the product. While one teaspoon (5ml) may be the perfect average amount to relieve reflux suffering in some, others may require more or less. You may adjust the amount of Mint Chocolate Rescue and Vanilla Caramel Rescue to the lowest amount necessary to meet your individual needs to improve your quality of life. -You may  dilute the product if it is too viscous for you to consume. Keep in mind, however, that the thickness of the product was formulated to provide optimal coating and protection of your throat and esophagus. Though diluting the product is possible, it may reduce the protective function and/or length of action. -This can be used in conjunction with reflux medications and lifestyle changes.  100% ALL-NATURAL  Paraben FREE, glycerin FREE, & potassium FREE  Made entirely from all-natural ingredients considered safe for children and during pregnancy  No known side effects  All-natural flavor Gluten FREE  Allergen FREE  Vegan  Can find more information here: NameSeizer.co.nz   Lets try pepcid  40 mg at night and nexium 40 mg in the morning Avoid spicy and acidic foods Avoid fatty foods Limit your intake of coffee, tea, alcohol, and carbonated drinks Work to maintain a healthy weight Keep the head of the bed elevated at least 3 inches with blocks or a wedge pillow if you are having any nighttime symptoms Stay upright for 2 hours after eating Avoid meals and snacks three to four hours before bedtime   Linzess  72 mcg and 145 mcg samples, call with which is better for you *IBS-C patients may begin to experience relief from belly pain and overall abdominal symptoms (pain, discomfort, and bloating) in about 1 week,  with symptoms typically improving over 12 weeks.  Take at least 30 minutes before the first meal of the day on an empty stomach You can have a loose stool if you eat a high-fat breakfast. Give it at least 7 days, may have more bowel movements during that time.  The diarrhea should go away and you should start having normal, complete, full bowel movements.  It may be helpful to start treatment when you can be near the comfort of your own bathroom, such as a weekend.  After you are out we can send in a prescription if you did well, there is a  prescription card  FIBER SUPPLEMENT You can do metamucil or fibercon once or twice a day but if this causes gas/bloating please switch to Benefiber or Citracel.  Fiber is good for constipation/diarrhea/irritable bowel syndrome.  It can also help with weight loss and can help lower your bad cholesterol (LDL).  Please do 1 TBSP in the morning in water, coffee, or tea.  It can take up to a month before you can see a difference with your bowel movements.  It is cheapest from [employer], [employer], [employer].    Toileting tips to help with your constipation - Drink at least 64-80 ounces of water/liquid per day. - Establish a time to try to move your bowels every day.  For many people, this is after a cup of coffee or after a meal such as breakfast. - Sit all of the way back on the toilet keeping your back fairly straight and while sitting up, try to rest the tops of your forearms on your upper thighs.   - Raising your feet with a step stool/squatty potty can be helpful to improve the angle that allows your stool to pass through the rectum. - Relax the rectum feeling it bulge toward the toilet water.  If you feel your rectum raising toward your body, you are contracting rather than relaxing. - Breathe in and slowly exhale. Belly breath by expanding your belly towards your belly button. Keep belly expanded as you gently direct pressure down and back to the anus.  A low pitched GRRR sound can assist with increasing intra-abdominal pressure.  (Can also trying to blow on a pinwheel and make it move, this helps with the same belly breathing) - Repeat 3-4 times. If unsuccessful, contract the pelvic floor to restore normal tone and get off the toilet.  Avoid excessive straining. - To reduce excessive wiping by teaching your anus to normally contract, place hands on outer aspect of knees and resist knee movement outward.  Hold 5-10 second then place hands just inside of knees and resist inward movement of knees.  Hold  5 seconds.  Repeat a few times each way.  Go to the ER if unable to pass gas, severe AB pain, unable to hold down food, any shortness of breath of chest pain.  Small intestinal bacterial overgrowth (SIBO) occurs when there is an abnormal increase in the overall bacterial population in the small intestine -- particularly types of bacteria not commonly found in that part of the digestive tract. Small intestinal bacterial overgrowth (SIBO) commonly results when a circumstance -- such as surgery or disease -- slows the passage of food and waste products in the digestive tract, creating a breeding ground for bacteria.  Signs and symptoms of SIBO often include: Loss of appetite Abdominal pain Nausea Bloating An uncomfortable feeling of fullness after eating Diarrhea or constipation, depending on the type of gas produced  What foods trigger SIBO? While foods aren't the original cause of SIBO, certain foods do encourage the overgrowth of the wrong bacteria in your small intestine. If you're feeding them their favorite foods, they're going to grow more, and that will trigger more of your SIBO symptoms. By the same  token, you can help reduce the overgrowth by starving the problematic bacteria of their favorite foods. This strategy has led to a number of proposed SIBO eating plans. The plans vary, and so do individual results. But in general, they tend to recommend limiting carbohydrates.  These include: Sugars and sweeteners. Fruits and starchy vegetables. Dairy products. Grains.  There is a test for this we can do called a breath test, if you are positive we will treat you with an antibiotic to see if it helps.  Your symptoms are very suspicious for this condition, as discussed, we will start you on an antibiotic to see if this helps.   Due to recent changes in healthcare laws, you may see the results of your imaging and laboratory studies on MyChart before your provider has had a chance to review  them.  We understand that in some cases there may be results that are confusing or concerning to you. Not all laboratory results come back in the same time frame and the provider may be waiting for multiple results in order to interpret others.  Please give us  48 hours in order for your provider to thoroughly review all the results before contacting the office for clarification of your results.    I appreciate the  opportunity to care for you  Thank You   [facility]

## 2024-06-20 NOTE — Progress Notes (Addendum)
 06/20/2024 Jessica [name] [id number] 1971/07/16  Referring provider: [name], NP Primary GI doctor: Dr. Suzann (Dr. 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 no dysphagia, no weight loss Started on wellbutrin  in the last week, BCP x last week, last NSAID 3 weeks ago, was using 1-2 x a week, no ETOH, tobacco use Pantoprazole  40 mg for 30 days in march did not help 08/2022 CT cardiac score 0 - may be related to wellbutrin  addition? Consider stopping - add on omeprazole  in AM and pepcid  at night -alginate therapy given -Lifestyle changes discussed, avoid NSAIDS, ETOH, hand out given to the patient -Schedule EGD at [facility] to evaluate GERD, esophagitis, hiatal hernia, H pylori. I discussed risks of EGD with patient today, including risk of sedation, bleeding or perforation. Patient provides understanding and gave verbal consent to proceed. - consider imaging if EGD is normal -Consider GES, gastroparesis diet given  Constipation - Increase fiber/ water intake, decrease caffeine, increase activity level. -Will add on Linzess  samples 72 and 145 will call with which is better -possible component of pelvis floor dysfunction with history and symptoms. Refer to pelvic floor PT  Screening colonoscopy 03/17/2022 colonoscopy with Dr. 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 Gatha and gets Feraheme  infusion as needed  I have reviewed the clinic note as outlined by Alan Coombs, PA and agree with the assessment, plan and medical  decision making.  Jessica presents to the office today for worsening symptoms of GERD.  She has had a 20-year history of reflux.  Now noticing symptoms of regurgitation, coughing and throat clearing.  Endorses a sensation of food coming up.  No dysphagia.  She has not had a previous upper endoscopy.  Agree that is reasonable to proceed with EGD given worsening reflux unresponsive to medical therapy.  Inocente Suzann, MD   Patient Care Team: Early, Camie BRAVO, NP as PCP - General (Nurse Practitioner) Alvan Ronal BRAVO, MD (Inactive) as PCP - Cardiology (Cardiology) Sherrod Sherrod, MD as Consulting Physician (Oncology)  HISTORY OF PRESENT ILLNESS: 53 y.o. female with a past medical history listed below presents for evaluation of GERD and constipation.   Discussed the use of AI scribe software for clinical note transcription with the patient, who gave verbal consent to proceed.  History of Present Illness   Jessica [name] is a 53 year old female with acid reflux who presents with worsening symptoms of acid reflux.  She has experienced a significant flare-up of her acid reflux symptoms over the past week, describing it as 'awful'. She experiences acid reflux daily, predominantly in the mornings, accompanied by coughing and throat clearing. She feels acid coming up and sometimes food with the acid, but more often just the acid. No trouble swallowing, but she reports a single episode of black stools a week ago, which she attributes to iron supplements, although she notes it looked different than usual. No weight loss.  She has a long-standing history of acid reflux, dating back to her thirties, and her mother also suffered from  severe acid reflux. She has not undergone an endoscopy before. Previously, she was on pantoprazole  for thirty days in March, which did not alleviate her symptoms. She has been using over-the-counter Tums for relief.  She reports constipation issues and a redundant colon was  identified during a colonoscopy in May 2023. She experiences a sensation of incomplete bowel movements and abdominal bloating. She uses Miralax and Metamucil to aid bowel movements, taking two capfuls of Miralax every other day or every two days, and reports variable effectiveness. She also experiences more frequent urination at night.  Her current medications include amlodipine  and atorvastatin  for high blood pressure and high cholesterol, respectively. She takes bupropion  consistently and buspirone  as needed for anxiety. She recently started on estrogen for menopause. She denies alcohol and tobacco use.     She  reports that she has never smoked. She has never been exposed to tobacco smoke. She has never used smokeless tobacco. She reports that she does not drink alcohol and does not use drugs.  RELEVANT GI HISTORY, IMAGING AND LABS: Results   Colonoscopy: Redundant colon, internal hemorrhoids, no polyps (03/2022)      CBC    Component Value Date/Time   WBC 7.1 03/03/2024 0849   WBC 6.5 09/10/2017 1333   RBC 4.54 03/03/2024 0849   HGB 10.2 (L) 03/03/2024 0849   HGB 10.3 (L) 01/23/2023 1059   HCT 33.0 (L) 03/03/2024 0849   HCT 32.7 (L) 01/23/2023 1059   PLT 310 03/03/2024 0849   PLT 299 01/23/2023 1059   MCV 72.7 (L) 03/03/2024 0849   MCV 72 (L) 01/23/2023 1059   MCH 22.5 (L) 03/03/2024 0849   MCHC 30.9 03/03/2024 0849   RDW 16.6 (H) 03/03/2024 0849   RDW 16.0 (H) 01/23/2023 1059   LYMPHSABS 2.3 03/03/2024 0849   LYMPHSABS 1.9 01/23/2023 1059   MONOABS 0.5 03/03/2024 0849   EOSABS 0.2 03/03/2024 0849   EOSABS 0.1 01/23/2023 1059   BASOSABS 0.1 03/03/2024 0849   BASOSABS 0.0 01/23/2023 1059   Recent Labs    09/13/23 0848 03/03/24 0849  HGB 11.4* 10.2*    CMP     Component Value Date/Time   NA 140 06/09/2024 1044   K 5.2 06/09/2024 1044   CL 100 06/09/2024 1044   CO2 23 06/09/2024 1044   GLUCOSE 108 (H) 06/09/2024 1044   GLUCOSE 101 (H) 03/03/2024 0849   BUN 25 (H)  06/09/2024 1044   CREATININE 1.61 (H) 06/09/2024 1044   CREATININE 1.43 (H) 03/03/2024 0849   CREATININE 0.98 09/10/2017 1333   CALCIUM  10.3 (H) 06/09/2024 1044   PROT 8.1 05/20/2024 1053   ALBUMIN 4.3 06/09/2024 1044   AST 25 05/20/2024 1053   AST 20 03/03/2024 0849   ALT 18 05/20/2024 1053   ALT 21 03/03/2024 0849   ALKPHOS 108 05/20/2024 1053   BILITOT 0.5 05/20/2024 1053   BILITOT 0.4 03/03/2024 0849   GFRNONAA 44 (L) 03/03/2024 0849   GFRAA 69 08/25/2020 0838   GFRAA >60 03/02/2020 1020      Latest Ref Rng & Units 06/09/2024   10:44 AM 05/20/2024   10:53 AM 03/03/2024    8:49 AM  Hepatic Function  Total Protein 6.0 - 8.5 g/dL  8.1  7.6   Albumin 3.8 - 4.9 g/dL 4.3  4.6  4.2   AST 0 - 40 IU/L  25  20   ALT 0 - 32 IU/L  18  21   Alk Phosphatase 44 - 121 IU/L  108  73   Total Bilirubin 0.0 - 1.2 mg/dL  0.5  0.4       Current Medications:   Current Outpatient Medications (Endocrine & Metabolic):    norethindrone-ethinyl estradiol -FE (LOESTRIN FE) 1-20 MG-MCG tablet, Take 1 tablet by mouth daily. OK to skip placebo to prevent hormone fluctuations.  Current Outpatient Medications (Cardiovascular):    amLODipine -atorvastatin  (CADUET ) 10-40 MG tablet, Take 1 tablet by mouth daily.     Current Outpatient Medications (Other):    ALPRAZolam  (XANAX ) 0.25 MG tablet, Take 0.125 - 0.25mg  (1/2 to 1 tab) for anxiety up to twice a day as needed, if Buspar  ineffective.   buPROPion  (WELLBUTRIN  XL) 150 MG 24 hr tablet, TAKE 1 TABLET BY MOUTH EVERY DAY   busPIRone  (BUSPAR ) 5 MG tablet, Take 5mg  (1 tablet)  as needed for anxiety up to 3 times in 1 day.   famotidine  (PEPCID ) 40 MG tablet, Take 1 tablet (40 mg total) by mouth at bedtime.   methocarbamol  (ROBAXIN -750) 750 MG tablet, Take 1 tablet (750 mg total) by mouth every 8 (eight) hours as needed for muscle spasms.   omeprazole  (PRILOSEC) 40 MG capsule, Take 1 capsule (40 mg total) by mouth daily.   traZODone  (DESYREL ) 50 MG tablet,  TAKE 1/2 TO 1 TABLET BY MOUTH AT BEDTIME AS NEEDED FOR SLEEP   venlafaxine  XR (EFFEXOR -XR) 75 MG 24 hr capsule, Take 1 capsule (75 mg total) by mouth daily with breakfast. Take 1 tablet (75mg ) every other day alternating with 150mg  venlafaxine  for 14 days then stop 150mg  dose and only take 75mg  dose daily.   Vitamin D , Ergocalciferol , (DRISDOL ) 1.25 MG (50000 UNIT) CAPS capsule, TAKE 1 CAPSULE (50,000 UNITS TOTAL) BY MOUTH EVERY 7 (SEVEN) DAYS  Medical History:  Past Medical History:  Diagnosis Date   Abdominal bloating 01/29/2024   Acute left-sided thoracic back pain 01/29/2024   Allergy    seasonal   Anemia    Anxiety    Depression    HYPERCHOLESTEROLEMIA 10/07/2007   Qualifier: Diagnosis of  By: Jacqueline Riggs     Hyperlipidemia    MGUS (monoclonal gammopathy of unknown significance)    Allergies:  Allergies  Allergen Reactions   Sulfonamide Derivatives     REACTION: hives     Surgical History:  She  has a past surgical history that includes Tubal ligation. Family History:  Her family history includes Alcohol abuse in her father and maternal uncle; Cancer in her paternal grandfather and paternal grandmother; Colon polyps in her maternal grandmother; Diabetes in her paternal grandmother; Drug abuse in her father; Heart disease in her maternal grandmother; Heart disease (age of onset: 63) in her mother; Hyperlipidemia in her father, maternal grandmother, and mother; Hypertension in her father, maternal grandmother, mother, and paternal grandmother.  REVIEW OF SYSTEMS  : All other systems reviewed and negative except where noted in the History of Present Illness.  PHYSICAL EXAM: BP 136/72 (BP Location: Left Arm, Patient Position: Sitting, Cuff Size: Normal)   Pulse 96   Ht 5' 6 (1.676 m) Comment: height measured without shoes  Wt 175 lb 6 oz (79.5 kg)   LMP 08/14/2023   BMI 28.31 kg/m  Physical Exam   GENERAL APPEARANCE: Well nourished, in no apparent distress HEENT: No  cervical lymphadenopathy, unremarkable thyroid , sclerae anicteric, conjunctiva pink RESPIRATORY: Respiratory effort normal, BS equal bilateral without rales, rhonchi, wheezing CARDIO: RRR with no MRGs, peripheral pulses intact ABDOMEN: Soft, non distended, active bowel sounds in all 4 quadrants, no tenderness  to palpation, no rebound, no mass appreciated RECTAL: Declines MUSCULOSKELETAL: Full ROM, normal gait, without edema SKIN: Dry, intact without rashes or lesions. No jaundice. NEURO: Alert, oriented, no focal deficits PSYCH: Cooperative, normal mood and affect.      Alan JONELLE Coombs, PA-C 11:41 AM

## 2024-06-21 LAB — IGA: Immunoglobulin A: 256 mg/dL (ref 47–310)

## 2024-06-21 LAB — TISSUE TRANSGLUTAMINASE, IGA: (tTG) Ab, IgA: <1 U/mL

## 2024-06-23 ENCOUNTER — Ambulatory Visit: Payer: Self-pay | Admitting: Physician Assistant

## 2024-06-23 NOTE — Progress Notes (Signed)
 PAP: pt. Is due now Pneumonia: going to wait T-dap: pt going to get today because she is due tomorrow   Catheline Doing, DNP, AGNP-c [facility] [address] [place], [place] [address] Main Office [phone]  BP 132/82   Pulse 92   Ht 5' 7 (1.702 m)   Wt 177 lb 6.4 oz (80.5 kg)   LMP 08/14/2023   BMI 27.78 kg/m    Subjective:    Patient ID: Jessica [name], female    DOB: 10/13/1971, 53 y.o.   MRN: [id number]  HPI: History of Present Illness Jessica [name] is a 53 year old female who presents for an annual physical exam with concerns of dry eye syndrome and swelling in her ankles and feet.  She has been experiencing dry eye syndrome for the past two months, which is bothersome. Over-the-counter eye drops provide only temporary relief. No other dryness is noted except for dry skin, which she manages with lotion. She reports changes in vision related to dry eye syndrome. She is due to see the eye doctor later this year.   She reports new swelling in her ankles and feet, first noticed on Saturday. The swelling reduces when she props her feet up at night. No recent increase in salty food intake, and she has been increasing her water intake. She has never dealt with swelling in this area before, except during pregnancy. There was a previous bump in her kidney function, which was noted in her labs. She is scheduled to see urology for this change.   She has a history of acid reflux, which flared up badly at the end of July, occurring both at night and during the day. She was prescribed two different acid reflux medications from GI (omeprazole  and famotidine ), which have helped improve her symptoms.   No vaginal discharge or burning, but she notes a change in vaginal odor, described as different but not negative since menopause. Her husband has not noticed any odor.  She has a history of anemia, which was noted in recent labs but is stable. She also had a bump in her  cholesterol levels, but her HDL is high, which is favorable.  She is active with her one-year-old granddaughter, indicating she is not sedentary. No hearing concerns reported.  Pertinent items are noted in HPI.  I Most Recent Depression Screen:     06/24/2024    9:36 AM 05/20/2024    9:58 AM 02/19/2023   11:16 AM 01/23/2023    9:35 AM 08/02/2022    3:47 PM  Depression screen PHQ 2/9  Decreased Interest 0 1 1 1  0  Down, Depressed, Hopeless 0 2 3 2  0  PHQ - 2 Score 0 3 4 3  0  Altered sleeping  0 0 0   Tired, decreased energy  0 0 0   Change in appetite  2 1 1    Feeling bad or failure about yourself   1 1 3    Trouble concentrating  3 1 3    Moving slowly or fidgety/restless  0 0 0   Suicidal thoughts  0 0 0   PHQ-9 Score  9 7 10    Difficult doing work/chores  Somewhat difficult Somewhat difficult Somewhat difficult    Most Recent Anxiety Screen:     05/20/2024   10:00 AM 02/19/2023   11:17 AM 01/19/2022    9:22 AM 09/10/2019    9:22 AM  GAD 7 : Generalized Anxiety Score  Nervous, Anxious, on Edge 3 3 2  1  Control/stop worrying 3 3 1 1   Worry too much - different things 3 3 1 1   Trouble relaxing 1 3 1 1   Restless 1 1 1  0  Easily annoyed or irritable 0 0 1 1  Afraid - awful might happen 0 0 2 1  Total GAD 7 Score 11 13 9 6   Anxiety Difficulty Somewhat difficult Somewhat difficult Very difficult Not difficult at all   Most Recent Fall Screen:    06/24/2024    9:36 AM 05/20/2024    9:58 AM 01/23/2023    9:35 AM 08/02/2022    3:47 PM 01/19/2022    9:01 AM  Fall Risk   Falls in the past year? 0 0 0 0 1  Number falls in past yr: 0 0 0 0 0  Injury with Fall? 0 0 0 0 1  Comment     pt has a bruise on upper left arm  Risk for fall due to : No Fall Risks No Fall Risks No Fall Risks No Fall Risks   Follow up Falls evaluation completed Falls evaluation completed Falls evaluation completed Falls evaluation completed  Falls evaluation completed;Education provided      Data saved with a  previous flowsheet row definition    Past medical history, surgical history, medications, allergies, family history and social history reviewed with patient today and changes made to appropriate areas of the chart.  Past Medical History:  Past Medical History:  Diagnosis Date   Abdominal bloating 01/29/2024   Acute left-sided thoracic back pain 01/29/2024   Allergy    seasonal   Anemia    Anxiety    Depression    Encounter for long-term (current) use of NSAIDs 01/29/2024   HYPERCHOLESTEROLEMIA 10/07/2007   Qualifier: Diagnosis of  By: Jacqueline Riggs     Hyperlipidemia    MGUS (monoclonal gammopathy of unknown significance)    Medications:  Current Outpatient Medications on File Prior to Visit  Medication Sig   ALPRAZolam  (XANAX ) 0.25 MG tablet Take 0.125 - 0.25mg  (1/2 to 1 tab) for anxiety up to twice a day as needed, if Buspar  ineffective.   amLODipine -atorvastatin  (CADUET ) 10-40 MG tablet Take 1 tablet by mouth daily.   buPROPion  (WELLBUTRIN  XL) 150 MG 24 hr tablet TAKE 1 TABLET BY MOUTH EVERY DAY   busPIRone  (BUSPAR ) 5 MG tablet Take 5mg  (1 tablet)  as needed for anxiety up to 3 times in 1 day.   famotidine  (PEPCID ) 40 MG tablet Take 1 tablet (40 mg total) by mouth at bedtime.   methocarbamol  (ROBAXIN -750) 750 MG tablet Take 1 tablet (750 mg total) by mouth every 8 (eight) hours as needed for muscle spasms.   norethindrone-ethinyl estradiol -FE (LOESTRIN FE) 1-20 MG-MCG tablet Take 1 tablet by mouth daily. OK to skip placebo to prevent hormone fluctuations.   omeprazole  (PRILOSEC) 40 MG capsule Take 1 capsule (40 mg total) by mouth daily.   traZODone  (DESYREL ) 50 MG tablet TAKE 1/2 TO 1 TABLET BY MOUTH AT BEDTIME AS NEEDED FOR SLEEP   venlafaxine  XR (EFFEXOR -XR) 75 MG 24 hr capsule Take 1 capsule (75 mg total) by mouth daily with breakfast. Take 1 tablet (75mg ) every other day alternating with 150mg  venlafaxine  for 14 days then stop 150mg  dose and only take 75mg  dose daily.    Vitamin D , Ergocalciferol , (DRISDOL ) 1.25 MG (50000 UNIT) CAPS capsule TAKE 1 CAPSULE (50,000 UNITS TOTAL) BY MOUTH EVERY 7 (SEVEN) DAYS   No current facility-administered medications on file prior to visit.  Surgical History:  Past Surgical History:  Procedure Laterality Date   TUBAL LIGATION     Allergies:  Allergies  Allergen Reactions   Sulfonamide Derivatives     REACTION: hives   Family History:  Family History  Problem Relation Age of Onset   Heart disease Mother 62       MI   Hyperlipidemia Mother    Hypertension Mother    Alcohol abuse Father    Drug abuse Father    Hyperlipidemia Father    Hypertension Father    Alcohol abuse Maternal Uncle    Colon polyps Maternal Grandmother    Heart disease Maternal Grandmother    Hypertension Maternal Grandmother    Hyperlipidemia Maternal Grandmother    Cancer Paternal Grandmother    Diabetes Paternal Grandmother    Hypertension Paternal Grandmother    Cancer Paternal Grandfather    Breast cancer Neg Hx    Colon cancer Neg Hx    Esophageal cancer Neg Hx    Stomach cancer Neg Hx    Rectal cancer Neg Hx        Objective:    BP 132/82   Pulse 92   Ht 5' 7 (1.702 m)   Wt 177 lb 6.4 oz (80.5 kg)   LMP 08/14/2023   BMI 27.78 kg/m   Wt Readings from Last 3 Encounters:  06/24/24 177 lb 6.4 oz (80.5 kg)  06/20/24 175 lb 6 oz (79.5 kg)  05/20/24 175 lb (79.4 kg)    Physical Exam Vitals and nursing note reviewed.  Constitutional:      General: She is not in acute distress.    Appearance: Normal appearance.  HENT:     Head: Normocephalic and atraumatic.     Right Ear: Hearing, tympanic membrane, ear canal and external ear normal.     Left Ear: Hearing, tympanic membrane, ear canal and external ear normal.     Nose: Nose normal.     Right Sinus: No maxillary sinus tenderness or frontal sinus tenderness.     Left Sinus: No maxillary sinus tenderness or frontal sinus tenderness.     Mouth/Throat:     Lips: Pink.      Mouth: Mucous membranes are moist.     Pharynx: Oropharynx is clear.  Eyes:     General: Lids are normal. Vision grossly intact.     Extraocular Movements: Extraocular movements intact.     Conjunctiva/sclera: Conjunctivae normal.     Pupils: Pupils are equal, round, and reactive to light.     Funduscopic exam:    Right eye: Red reflex present.        Left eye: Red reflex present.    Visual Fields: Right eye visual fields normal and left eye visual fields normal.  Neck:     Thyroid : No thyromegaly.     Vascular: No carotid bruit.  Cardiovascular:     Rate and Rhythm: Normal rate and regular rhythm.     Chest Wall: PMI is not displaced.     Pulses: Normal pulses.          Dorsalis pedis pulses are 2+ on the right side and 2+ on the left side.       Posterior tibial pulses are 2+ on the right side and 2+ on the left side.     Heart sounds: Normal heart sounds. No murmur heard. Pulmonary:     Effort: Pulmonary effort is normal. No respiratory distress.     Breath sounds: Normal breath sounds.  Abdominal:     General: Abdomen is flat. Bowel sounds are normal. There is no distension.     Palpations: Abdomen is soft. There is no hepatomegaly, splenomegaly or mass.     Tenderness: There is no abdominal tenderness. There is no right CVA tenderness, left CVA tenderness, guarding or rebound.  Genitourinary:    General: Normal vulva.     Exam position: Lithotomy position.     Labia:        Right: No rash or tenderness.        Left: No rash or tenderness.      Vagina: Normal.     Cervix: Normal.     Uterus: Normal.      Adnexa: Right adnexa normal and left adnexa normal.       Right: No mass or tenderness.         Left: No mass or tenderness.    Musculoskeletal:        General: Normal range of motion.     Cervical back: Full passive range of motion without pain, normal range of motion and neck supple. No tenderness.     Right lower leg: Edema present.     Left lower leg: Edema  present.     Comments: Mild non-pitting   Feet:     Left foot:     Toenail Condition: Left toenails are normal.  Lymphadenopathy:     Cervical: No cervical adenopathy.     Upper Body:     Right upper body: No supraclavicular adenopathy.     Left upper body: No supraclavicular adenopathy.  Skin:    General: Skin is warm and dry.     Capillary Refill: Capillary refill takes less than 2 seconds.     Nails: There is no clubbing.  Neurological:     General: No focal deficit present.     Mental Status: She is alert and oriented to person, place, and time.     GCS: GCS eye subscore is 4. GCS verbal subscore is 5. GCS motor subscore is 6.     Sensory: Sensation is intact.     Motor: Motor function is intact. No weakness.     Coordination: Coordination is intact.     Gait: Gait is intact.     Deep Tendon Reflexes: Reflexes are normal and symmetric.  Psychiatric:        Attention and Perception: Attention normal.        Mood and Affect: Mood normal.        Speech: Speech normal.        Behavior: Behavior normal. Behavior is cooperative.        Thought Content: Thought content normal.        Cognition and Memory: Cognition and memory normal.        Judgment: Judgment normal.      Results for orders placed or performed in visit on 06/20/24  IgA   Collection Time: 06/20/24 11:36 AM  Result Value Ref Range   Immunoglobulin A 256 47 - 310 mg/dL  Tissue transglutaminase, IgA   Collection Time: 06/20/24 11:36 AM  Result Value Ref Range   (tTG) Ab, IgA <1.0 U/mL  Comprehensive metabolic panel with GFR   Collection Time: 06/20/24 11:36 AM  Result Value Ref Range   Sodium 139 135 - 145 mEq/L   Potassium 4.3 3.5 - 5.1 mEq/L   Chloride 102 96 - 112 mEq/L   CO2 28 19 - 32 mEq/L   Glucose,  Bld 102 (H) 70 - 99 mg/dL   BUN 17 6 - 23 mg/dL   Creatinine, Ser 8.63 (H) 0.40 - 1.20 mg/dL   Total Bilirubin 0.5 0.2 - 1.2 mg/dL   Alkaline Phosphatase 85 39 - 117 U/L   AST 19 0 - 37 U/L   ALT  17 0 - 35 U/L   Total Protein 8.3 6.0 - 8.3 g/dL   Albumin 4.4 3.5 - 5.2 g/dL   GFR 55.52 (L) >39.99 mL/min   Calcium  9.7 8.4 - 10.5 mg/dL  CBC with Differential/Platelet   Collection Time: 06/20/24 11:36 AM  Result Value Ref Range   WBC 8.1 4.0 - 10.5 K/uL   RBC 4.54 3.87 - 5.11 Mil/uL   Hemoglobin 10.1 (L) 12.0 - 15.0 g/dL   HCT 68.2 (L) 63.9 - 53.9 %   MCV 69.9 Repeated and verified X2. (L) 78.0 - 100.0 fl   MCHC 31.8 30.0 - 36.0 g/dL   RDW 84.6 88.4 - 84.4 %   Platelets 288.0 150.0 - 400.0 K/uL   Neutrophils Relative % 69.3 43.0 - 77.0 %   Lymphocytes Relative 21.1 12.0 - 46.0 %   Monocytes Relative 8.0 3.0 - 12.0 %   Eosinophils Relative 0.3 0.0 - 5.0 %   Basophils Relative 1.3 0.0 - 3.0 %   Neutro Abs 5.6 1.4 - 7.7 K/uL   Lymphs Abs 1.7 0.7 - 4.0 K/uL   Monocytes Absolute 0.6 0.1 - 1.0 K/uL   Eosinophils Absolute 0.0 0.0 - 0.7 K/uL   Basophils Absolute 0.1 0.0 - 0.1 K/uL       Assessment & Plan:   Problem List Items Addressed This Visit     ANEMIA, IRON DEFICIENCY, UNSPEC.   Anemia remains well-managed with no significant changes in recent lab results. - Continue management by gastroenterologist      Mixed hyperlipidemia   High HDL cholesterol beneficial in managing LDL levels. No major concerns with cholesterol levels.      Essential hypertension   BP slightly elevated today. Managed with hydrochlorothiazide . Recommend at home monitoring of BP and letting me know if the numbers are consistently higher than 130/80. Will plan to adjust medication if elevated readings continue.       Encounter for annual physical exam - Primary   CPE completed today. Review of HM activities and recommendations discussed and provided on AVS. Anticipatory guidance, diet, and exercise recommendations provided. Medications, allergies, and hx reviewed and updated as necessary. Orders placed as listed below.  Plan: - Labs ordered. Will make changes as necessary based on results.  - I  will review these results and send recommendations via MyChart or a telephone call.  - F/U with CPE in 1 year or sooner for acute/chronic health needs as directed.        Dry eye syndrome of both eyes   Bothersome dry eye symptoms for two months with minimal temporary relief from over-the-counter drops. No recent ophthalmology consultation. Discussed option of Restasis  and she is interested in trial.  - Check insurance coverage for Restasis  - Consider alternative treatments if Restasis  is not covered - Recommend consultation with an eye doctor      Relevant Medications   cycloSPORINE  (RESTASIS ) 0.05 % ophthalmic emulsion   Leg swelling   Mild edema with reduction upon elevation. No recent changes in activity or diet. Possible relation to kidney function changes. No alarm symptoms present.  - Recommend wearing compression stockings (15-20 mmHg) during the day - Ensure follow-up with  nephrologist - Monitor BP closely and report readings >130/80.       Decreased calculated GFR   Recent fluctuations in kidney function. Pending nephrology follow-up for comprehensive management. - Ensure nephrology appointment is scheduled and attended      Gastroesophageal reflux disease   Recent flare-up at the end of July, now improved with medication. No current worsening of symptoms.      Other Visit Diagnoses       Need for Tdap vaccination       Relevant Orders   Tdap vaccine greater than or equal to 7yo IM (Completed)     Papanicolaou smear for cervical cancer screening       Relevant Orders   Cytology - PAP([place])         Follow up plan: Return in about 6 months (around 12/25/2024) for Med Management 30.  NEXT PREVENTATIVE PHYSICAL DUE IN 1 YEAR.  PATIENT COUNSELING PROVIDED FOR ALL ADULT PATIENTS: A well balanced diet low in saturated fats, cholesterol, and moderation in carbohydrates.  This can be as simple as monitoring portion sizes and cutting back on sugary beverages  such as soda and juice to start with.    Daily water consumption of at least 64 ounces.  Physical activity at least 180 minutes per week.  If just starting out, start 10 minutes a day and work your way up.   This can be as simple as taking the stairs instead of the elevator and walking 2-3 laps around the office  purposefully every day.   STD protection, partner selection, and regular testing if high risk.  Limited consumption of alcoholic beverages if alcohol is consumed. For men, I recommend no more than 14 alcoholic beverages per week, spread out throughout the week (max 2 per day). Avoid binge drinking or consuming large quantities of alcohol in one setting.  Please let me know if you feel you may need help with reduction or quitting alcohol consumption.   Avoidance of nicotine, if used. Please let me know if you feel you may need help with reduction or quitting nicotine use.   Daily mental health attention. This can be in the form of 5 minute daily meditation, prayer, journaling, yoga, reflection, etc.  Purposeful attention to your emotions and mental state can significantly improve your overall wellbeing  and  Health.  Please know that I am here to help you with all of your health care goals and am happy to work with you to find a solution that works best for you.  The greatest advice I have received with any changes in life are to take it one step at a time, that even means if all you can focus on is the next 60 seconds, then do that and celebrate your victories.  With any changes in life, you will have set backs, and that is OK. The important thing to remember is, if you have a set back, it is not a failure, it is an opportunity to try again! Screening Testing Mammogram Every 1 -2 years based on history and risk factors Starting at age 19 Pap Smear Ages 21-39 every 3 years Ages 75-65 every 5 years with HPV testing More frequent testing may be required based on results and  history Colon Cancer Screening Every 1-10 years based on test performed, risk factors, and history Starting at age 78 Bone Density Screening Every 2-10 years based on history Starting at age 6 for women Recommendations for men differ based on medication  usage, history, and risk factors AAA Screening One time ultrasound Men 16-48 years old who have every smoked Lung Cancer Screening Low Dose Lung CT every 12 months Age 31-80 years with a 30 pack-year smoking history who still smoke or who have quit within the last 15 years   Screening Labs Routine  Labs: Complete Blood Count (CBC), Complete Metabolic Panel (CMP), Cholesterol (Lipid Panel) Every 6-12 months based on history and medications May be recommended more frequently based on current conditions or previous results Hemoglobin A1c Lab Every 3-12 months based on history and previous results Starting at age 45 or earlier with diagnosis of diabetes, high cholesterol, BMI >26, and/or risk factors Frequent monitoring for patients with diabetes to ensure blood sugar control Thyroid  Panel (TSH) Every 6 months based on history, symptoms, and risk factors May be repeated more often if on medication HIV One time testing for all patients 11 and older May be repeated more frequently for patients with increased risk factors or exposure Hepatitis C One time testing for all patients 75 and older May be repeated more frequently for patients with increased risk factors or exposure Gonorrhea, Chlamydia Every 12 months for all sexually active persons 13-24 years Additional monitoring may be recommended for those who are considered high risk or who have symptoms Every 12 months for any woman on birth control, regardless of sexual activity PSA Men 32-68 years old with risk factors Additional screening may be recommended from age 49-69 based on risk factors, symptoms, and history  Vaccine Recommendations Tetanus Booster All adults every 10  years Flu Vaccine All patients 6 months and older every year COVID Vaccine All patients 12 years and older Initial dosing with booster May recommend additional booster based on age and health history HPV Vaccine 2 doses all patients age 63-26 Dosing may be considered for patients over 26 Shingles Vaccine (Shingrix ) 2 doses all adults 55 years and older Pneumonia (Pneumovax 23) All adults 65 years and older May recommend earlier dosing based on health history One year apart from Prevnar 13 Pneumonia (Prevnar 25) All adults 65 years and older Dosed 1 year after Pneumovax 23 Pneumonia (Prevnar 20) One time alternative to the two dosing of 13 and 23 For all adults with initial dose of 23, 20 is recommended 1 year later For all adults with initial dose of 13, 23 is still recommended as second option 1 year later

## 2024-06-24 ENCOUNTER — Encounter: Payer: Self-pay | Admitting: Nurse Practitioner

## 2024-06-24 ENCOUNTER — Ambulatory Visit: Admitting: Nurse Practitioner

## 2024-06-24 ENCOUNTER — Encounter: Payer: Self-pay | Admitting: Physical Therapy

## 2024-06-24 ENCOUNTER — Other Ambulatory Visit (HOSPITAL_COMMUNITY)
Admission: RE | Admit: 2024-06-24 | Discharge: 2024-06-24 | Disposition: A | Discharge status: Home / Self Care | Source: Ambulatory Visit | Attending: Nurse Practitioner | Admitting: Nurse Practitioner

## 2024-06-24 ENCOUNTER — Encounter: Attending: Physician Assistant | Admitting: Physical Therapy

## 2024-06-24 ENCOUNTER — Other Ambulatory Visit: Payer: Self-pay

## 2024-06-24 ENCOUNTER — Other Ambulatory Visit

## 2024-06-24 VITALS — BP 132/82 | HR 92 | Ht 67.0 in | Wt 177.4 lb

## 2024-06-24 DIAGNOSIS — Z23 Encounter for immunization: Secondary | ICD-10-CM | POA: Diagnosis not present

## 2024-06-24 DIAGNOSIS — R278 Other lack of coordination: Secondary | ICD-10-CM | POA: Diagnosis not present

## 2024-06-24 DIAGNOSIS — R252 Cramp and spasm: Secondary | ICD-10-CM | POA: Insufficient documentation

## 2024-06-24 DIAGNOSIS — Z0000 Encounter for general adult medical examination without abnormal findings: Secondary | ICD-10-CM | POA: Diagnosis not present

## 2024-06-24 DIAGNOSIS — M7989 Other specified soft tissue disorders: Secondary | ICD-10-CM

## 2024-06-24 DIAGNOSIS — E782 Mixed hyperlipidemia: Secondary | ICD-10-CM

## 2024-06-24 DIAGNOSIS — R944 Abnormal results of kidney function studies: Secondary | ICD-10-CM | POA: Insufficient documentation

## 2024-06-24 DIAGNOSIS — I10 Essential (primary) hypertension: Secondary | ICD-10-CM

## 2024-06-24 DIAGNOSIS — K219 Gastro-esophageal reflux disease without esophagitis: Secondary | ICD-10-CM

## 2024-06-24 DIAGNOSIS — Z124 Encounter for screening for malignant neoplasm of cervix: Secondary | ICD-10-CM | POA: Diagnosis not present

## 2024-06-24 DIAGNOSIS — D509 Iron deficiency anemia, unspecified: Secondary | ICD-10-CM

## 2024-06-24 DIAGNOSIS — H04123 Dry eye syndrome of bilateral lacrimal glands: Secondary | ICD-10-CM | POA: Diagnosis not present

## 2024-06-24 MED ORDER — CYCLOSPORINE 0.05 % OP EMUL: 1.0000 [drp] | Freq: Two times a day (BID) | OPHTHALMIC | 2 refills | Status: AC

## 2024-06-24 NOTE — Assessment & Plan Note (Signed)
 Recent fluctuations in kidney function. Pending nephrology follow-up for comprehensive management. - Ensure nephrology appointment is scheduled and attended

## 2024-06-24 NOTE — Assessment & Plan Note (Signed)
 Mild edema with reduction upon elevation. No recent changes in activity or diet. Possible relation to kidney function changes. No alarm symptoms present.  - Recommend wearing compression stockings (15-20 mmHg) during the day - Ensure follow-up with nephrologist - Monitor BP closely and report readings >130/80.

## 2024-06-24 NOTE — Assessment & Plan Note (Signed)
 Recent flare-up at the end of July, now improved with medication. No current worsening of symptoms.

## 2024-06-24 NOTE — Patient Instructions (Addendum)
 How To Poop Better:  What are Good Poops? There is no one exact normal, but they should be REGULAR.  This varies from person to person and ranges from up to 3x/day or as little as 3-4/week.  This should stay consistent for you.  They should be formed and ideally one solid mass that doesn't fall apart or dissolve in the water and is brown in color.  Lifestyle Tips:  Fiber: Eat 25-31 grams per day Do not hold it.  If you need to go, GO! Try to go every day around the same time Walk and move more Probiotics for more healthy gut bacteria Water and fluids: half of your healthy body weight in ounces  Toileting Tips:  Posture: knees above hips, back flat, look straight ahead, RELAX Relax all the muscles from your face down to your toes Breathe: slow deep breaths into your belly and pelvic floor is RELAXED Blow: Tighten belly and blow like blowing up a balloon, make "SH" sound, make a vowel sound with a deep voice Do NOT sit more than 10 minutes After you are finished, tighten the muscles to reset pelvic floor back to normal      About Abdominal Massage  Abdominal massage, also called external colon massage, is a self-treatment circular massage technique that can reduce and eliminate gas and ease constipation. The colon naturally contracts in waves in a clockwise direction starting from inside the right hip, moving up toward the ribs, across the belly, and down inside the left hip.  When you perform circular abdominal massage, you help stimulate your colon's normal wave pattern of movement called peristalsis.  It is most beneficial when done after eating.  Positioning You can practice abdominal massage with oil while lying down, or in the shower with soap.  Some people find that it is just as effective to do the massage through clothing while sitting or standing.  How to Massage Start by placing your finger tips or knuckles on your right side, just inside your hip bone.  Make small  circular movements while you move upward toward your rib cage.   Once you reach the bottom right side of your rib cage, take your circular movements across to the left side of the bottom of your rib cage.  Next, move downward until you reach the inside of your left hip bone.  This is the path your feces travel in your colon. Continue to perform your abdominal massage in this pattern for 10 minutes each day.     You can apply as much pressure as is comfortable in your massage.  Start gently and build pressure as you continue to practice.  Notice any areas of pain as you massage; areas of slight pain may be relieved as you massage, but if you have areas of significant or intense pain, consult with your healthcare provider.  Other Considerations General physical activity including bending and stretching can have a beneficial massage-like effect on the colon.  Deep breathing can also stimulate the colon because breathing deeply activates the same nervous system that supplies the colon.   Abdominal massage should always be used in combination with a bowel-conscious diet that is high in the proper type of fiber for you, fluids (primarily water), and a regular exercise program.   Channing Pereyra, PT [facility] [address], [address] [place], [place] [address] W: [phone] [email or web address]  Moisturizers They are used in the vagina to hydrate the mucous membrane that make up the vaginal  canal. Designed to keep a more normal acid balance (ph) Once placed in the vagina, it will last between two to three days.  Use 2-3 times per week at bedtime  Ingredients to avoid is glycerin and fragrance, can increase chance of infection Should not be used just before sex due to causing irritation Most are gels administered either in a tampon-shaped applicator or as a vaginal suppository. They are non-hormonal.   Types of Moisturizers(internal use)  Vitamin E vaginal  suppositories- Whole foods, Amazon Moist Again Coconut oil- can break down condoms, any grocery store (prefer organic) Julva- (Do no use if taking  Tamoxifen) amazon Yes moisturizer- amazon NeuEve Silk , NeuEve Silver for menopausal or over 65 (if have severe vaginal atrophy or cancer treatments use NeuEve Silk for  1 month than move to [employer])- [employer], [email or web address] Olive and Bee intimate cream- [email or web address] Mae vaginal moisturizer- Amazon Aloe Good Clean Love Hyaluronic acid Hyalofemme Reveree hyaluronic acid inserts   Creams to use externally on the Vulva area Marathon Oil (good for for cancer patients that had radiation to the area)- guam or [email or web address] Vulva Balm/ V-magic cream by medicine mama- amazon Julva-amazon Vital V Wild Yam salve ( help moisturize and help with thinning vulvar area, does have Beeswax MoodMaid Botanical Pro-Meno Wild Yam Cream- Amazon Desert Harvest Gele Cleo by Sherrlyn labial moisturizer (Amazon),  Coconut or olive oil aloe Good Clean Love Enchanted Rose by intimate rose  Things to avoid in the vaginal area Do not use things to irritate the vulvar area No lotions just specialized creams for the vulva area- Neogyn, V-magic,  No soaps; can use Aveeno or Calendula cleanser, unscented Dove if needed. Must be gentle No deodorants No douches Good to sleep without underwear to let the vaginal area to air out No scrubbing: spread the lips to let warm water rinse over labias and pat dry

## 2024-06-24 NOTE — Therapy (Signed)
 OUTPATIENT PHYSICAL THERAPY FEMALE PELVIC EVALUATION   Patient Name: Jessica [name] MRN: [id number] DOB:[birth year], 53 y.o., female Today's Date: 06/24/2024  END OF SESSION:  PT End of Session - 06/24/24 1310     Visit Number 1    Date for PT Re-Evaluation 11/24/24    Authorization Type BCBS    PT Start Time 1300    PT Stop Time 1345    PT Time Calculation (min) 45 min    Activity Tolerance Patient tolerated treatment well    Behavior During Therapy [facility] for tasks assessed/performed          Past Medical History:  Diagnosis Date   Abdominal bloating 01/29/2024   Acute left-sided thoracic back pain 01/29/2024   Allergy    seasonal   Anemia    Anxiety    Depression    HYPERCHOLESTEROLEMIA 10/07/2007   Qualifier: Diagnosis of  By: Jacqueline Riggs     Hyperlipidemia    MGUS (monoclonal gammopathy of unknown significance)    Past Surgical History:  Procedure Laterality Date   TUBAL LIGATION     Patient Active Problem List   Diagnosis Date Noted   Encounter for annual physical exam 06/09/2024   Health belief conflict 06/09/2024   Encounter for long-term (current) use of NSAIDs 01/29/2024   Pain in left lumbar region of back 04/02/2023   Vitamin D  deficiency 02/19/2023   Perimenopause 01/26/2023   Low libido 01/26/2023   Deformity of nail bed 01/26/2023   Mixed hyperlipidemia 01/19/2022   Essential hypertension 01/19/2022   Primary insomnia 01/19/2022   MGUS (monoclonal gammopathy of unknown significance) 11/11/2018   Beta thalassemia minor 08/12/2018   Obsessive compulsive disorder 02/18/2014   Major depressive disorder, recurrent episode, moderate (HCC) 12/04/2012   Social anxiety disorder 07/15/2008   VITILIGO 10/07/2007   ANEMIA, IRON DEFICIENCY, UNSPEC. 01/10/2007    PCP: [name], NP  REFERRING PROVIDER: Craig Alan SAUNDERS, PA-C  REFERRING DIAG: [phone] (ICD-10-CM) - Chronic idiopathic constipation   THERAPY DIAG:  Cramp and spasm - Plan: PT  plan of care cert/re-cert  Other lack of coordination - Plan: PT plan of care cert/re-cert  Rationale for Evaluation and Treatment: Rehabilitation  ONSET DATE: 2025  SUBJECTIVE:                                                                                                                                                                                           SUBJECTIVE STATEMENT: I have been constipated for years. Patient has used laxatives but still constipated. Patient has just started Linzess  andit has been helping.  Fluid intake: water, tea  PAIN:  Are  you having pain? No  PRECAUTIONS: None  RED FLAGS: None   WEIGHT BEARING RESTRICTIONS: No  FALLS:  Has patient fallen in last 6 months? 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  ACTIVITY LEVEL : low activity  PLOF: Independent  PATIENT GOALS: ways to relief constipation  PERTINENT HISTORY:  none Sexual abuse: No  BOWEL MOVEMENT: Pain with bowel movement: No Type of bowel movement:Type (Bristol Stool Scale) Type 2, type 4 but with linzess  is Type 7, Frequency every 3 days, Strain yes, and Splinting yes Fully empty rectum: No Leakage: No Fiber supplement/laxative Linzess   URINATION: Pain with urination: No Fully empty bladder: Yes:   Stream: Strong Urgency: Yes  Frequency: every 2-3 hours, wake 1 time Leakage: none Pads: No  INTERCOURSE:not active right now does not have desire due to menopause  Ability to have vaginal penetration Yes   PREGNANCY: Vaginal deliveries 2 Tearing Yes: few stitches  PROLAPSE: None   OBJECTIVE:  Note: Objective measures were completed at Evaluation unless otherwise noted.  DIAGNOSTIC FINDINGS:  none  PATIENT SURVEYS:  PFIQ-7: 17 CRAIQ-7: 19  COGNITION: Overall cognitive status: Within functional limits for tasks assessed     SENSATION: Light touch: Appears intact   FUNCTIONAL TESTS:  SLS on right is 3 sec, left is 10 sec   POSTURE:  rounded shoulders and forward head   LUMBARAROM/PROM:lumbar ROM is full   LOWER EXTREMITY MNF:apojuzmjo hip ROM is full   LOWER EXTREMITY MMT:  MMT Right eval Left eval  Hip abduction 4/5 5/5  Hip adduction 4/5 4/5   (Blank rows = not tested) PALPATION:   Abdominal: patient will contract the higher abdominal compared to the lower                External Perineal Exam: tightness along the perineal body and left external sphincter                             Internal Pelvic Floor: restrictions around the puborectalis posteriorly, alon gthe let mid rectum, and anterior rectum  Patient confirms identification and approves PT to assess internal pelvic floor and treatment Yes No emotional/communication barriers or cognitive limitation. Patient is motivated to learn. Patient understands and agrees with treatment goals and plan. PT explains patient will be examined in standing, sitting, and lying down to see how their muscles and joints work. When they are ready, they will be asked to remove their underwear so PT can examine their perineum. The patient is also given the option of providing their own chaperone as one is not provided in our facility. The patient also has the right and is explained the right to defer or refuse any part of the evaluation or treatment including the internal exam. With the patient's consent, PT will use one gloved finger to gently assess the muscles of the pelvic floor, seeing how well it contracts and relaxes and if there is muscle symmetry. After, the patient will get dressed and PT and patient will discuss exam findings and plan of care. PT and patient discuss plan of care, schedule, attendance policy and HEP activities.   PELVIC MMT:   MMT eval  Internal Anal Sphincter 3/5  External Anal Sphincter 3/5  Puborectalis 3/5  (Blank rows = not tested)         TODAY'S TREATMENT:  DATE: 06/24/24  EVAL Therapeutic activities: Educated patient on how to sit on the commode with knees higher than the hips to relax the puborectalis, breathing to relax the pelvic floor than breathing to generate pressure to push stool out Self-care: Educated patient on vaginal moisturizers to reduce the dryness in the vulvar area and educated her on vaginal health to improve the tissue Educated patient on abdominal massage to improve the peristalic motion of the intestines so stool can move through easier      PATIENT EDUCATION:  Education details: education on abdominal massage, vaginal moisturizers, vaginal health, and how to poop correctly Person educated: Patient Education method: Explanation, Demonstration, Tactile cues, Verbal cues, and Handouts Education comprehension: verbalized understanding, returned demonstration, verbal cues required, tactile cues required, and needs further education  HOME EXERCISE PROGRAM: See above.   ASSESSMENT:  CLINICAL IMPRESSION: Patient is a 53 y.o. female who was seen today for physical therapy evaluation and treatment for constipation. Patient reports years of constipation. She has a redundant colon. Patient has been taking Linzess  and that is helping. She will have a bowel movement every 3 days, strains to have one and at times has to splint. She has restrictions in the rectum, along the perineal body and external anal sphincter. She learned how to perform diaphragmatic breathing to elongate pelvic floor and how to breath out to generate force to push stool out. She will over contract her upper abdominals compared to the lower. She has vaginal dryness due to menopause so will learn how to improve her vaginal health. Patient will benefit from skilled therapy to reduce straining and pressure on the pelvic floor and coordination.   OBJECTIVE IMPAIRMENTS: decreased coordination, decreased strength, increased fascial  restrictions, and increased muscle spasms.   ACTIVITY LIMITATIONS: toileting  PARTICIPATION LIMITATIONS: shopping and community activity  PERSONAL FACTORS: Time since onset of injury/illness/exacerbation are also affecting patient's functional outcome.   REHAB POTENTIAL: Excellent  CLINICAL DECISION MAKING: Evolving/moderate complexity  EVALUATION COMPLEXITY: Moderate   GOALS: Goals reviewed with patient? Yes  SHORT TERM GOALS: Target date: 06/24/24  Patient independent with initial HEP to engage her core and lengthening her pelvic floor.  Baseline: Goal status: INITIAL  2.  Patient is able to perform diaphragmatic breathing to relax her pelvic floor for bowel movement.  Baseline:  Goal status: INITIAL  3.  Patient educated on vaginal dryness to improve perineal tissue health and how to use moisturizers.  Baseline:  Goal status: INITIAL  4.  Patient educated on abdominal massage to improve peristalic motion of the intestines.  Baseline:  Goal status: INITIAL   LONG TERM GOALS: Target date: 11/24/24  Patient independent with advanced HEP for coordination of the pelvic floor to improve bowel movements.  Baseline:  Goal status: INITIAL  2.  Patient is able to not strain to have a bowel movement 4 out of 5 times due to the improved coordination of the pelvic floor muscles.  Baseline:  Goal status: INITIAL  3.  Patient is able to feel comfortable to go out in public due to reduction of bloating from constipation.  Baseline:  Goal status: INITIAL  4.  CRAIQ-7 score decreased </= 5 compared to  19 Baseline:  Goal status: INITIAL   PLAN:  PT FREQUENCY: 1x/week  PT DURATION: other: 5 months  PLANNED INTERVENTIONS: 97110-Therapeutic exercises, 97530- Therapeutic activity, 97112- Neuromuscular re-education, 97535- Self Care, [id number]- Manual therapy, Joint mobilization, Spinal mobilization, and Biofeedback  PLAN FOR NEXT SESSION: manual work to the abdomen;  work on  lower rib cage for diaphragmatic breathing, hip stretches to elongate the pelvic floor, review vaginal moisturizers and bowel movements   Channing Pereyra, PT 06/24/24 2:23 PM

## 2024-06-24 NOTE — Assessment & Plan Note (Signed)
 Bothersome dry eye symptoms for two months with minimal temporary relief from over-the-counter drops. No recent ophthalmology consultation. Discussed option of Restasis  and she is interested in trial.  - Check insurance coverage for Restasis  - Consider alternative treatments if Restasis  is not covered - Recommend consultation with an eye doctor

## 2024-06-24 NOTE — Assessment & Plan Note (Signed)

## 2024-06-24 NOTE — Assessment & Plan Note (Signed)
 BP slightly elevated today. Managed with hydrochlorothiazide . Recommend at home monitoring of BP and letting me know if the numbers are consistently higher than 130/80. Will plan to adjust medication if elevated readings continue.

## 2024-06-24 NOTE — Assessment & Plan Note (Signed)
 Anemia remains well-managed with no significant changes in recent lab results. - Continue management by gastroenterologist

## 2024-06-24 NOTE — Patient Instructions (Addendum)
 I recommend compression stocking during the day. Look for 15-82mmHg compression. You can find these on amazon for very reasonable pricing. This will help with the swelling.   I have sent in restasis  eye drops to see if this helps with your dry eye.

## 2024-06-24 NOTE — Assessment & Plan Note (Signed)
 High HDL cholesterol beneficial in managing LDL levels. No major concerns with cholesterol levels.

## 2024-06-25 ENCOUNTER — Telehealth: Payer: Self-pay | Admitting: Physician Assistant

## 2024-06-25 MED ORDER — LINACLOTIDE 145 MCG PO CAPS: 145.0000 ug | ORAL_CAPSULE | Freq: Every day | ORAL | 2 refills | Status: DC

## 2024-06-25 NOTE — Telephone Encounter (Signed)
 Sent in Linzess  145 mcg to patients pharmacy

## 2024-06-25 NOTE — Telephone Encounter (Signed)
 Inbound call from patient stating she was calling to give an  update on her taking samples of Linzess . Patient states the 145 mcg is working great and is requesting a prescription  . Please advise.

## 2024-06-26 LAB — CYTOLOGY - PAP
Adequacy: ABSENT
Comment: NEGATIVE
Diagnosis: NEGATIVE
High risk HPV: NEGATIVE

## 2024-07-01 ENCOUNTER — Ambulatory Visit: Payer: Self-pay | Admitting: Nurse Practitioner

## 2024-07-12 ENCOUNTER — Other Ambulatory Visit: Payer: Self-pay | Admitting: Physician Assistant

## 2024-07-16 ENCOUNTER — Other Ambulatory Visit: Payer: Self-pay | Admitting: Nurse Practitioner

## 2024-07-16 DIAGNOSIS — N1832 Chronic kidney disease, stage 3b: Secondary | ICD-10-CM | POA: Diagnosis not present

## 2024-07-16 DIAGNOSIS — F331 Major depressive disorder, recurrent, moderate: Secondary | ICD-10-CM

## 2024-07-16 DIAGNOSIS — F4010 Social phobia, unspecified: Secondary | ICD-10-CM

## 2024-07-17 ENCOUNTER — Other Ambulatory Visit: Payer: Self-pay

## 2024-07-17 DIAGNOSIS — N1832 Chronic kidney disease, stage 3b: Secondary | ICD-10-CM

## 2024-07-21 ENCOUNTER — Ambulatory Visit
Admission: RE | Admit: 2024-07-21 | Discharge: 2024-07-21 | Disposition: A | Discharge status: Home / Self Care | Source: Ambulatory Visit

## 2024-07-21 DIAGNOSIS — N189 Chronic kidney disease, unspecified: Secondary | ICD-10-CM | POA: Diagnosis not present

## 2024-07-21 DIAGNOSIS — N1832 Chronic kidney disease, stage 3b: Secondary | ICD-10-CM

## 2024-07-21 LAB — LAB REPORT - SCANNED
Albumin, Urine POC: 36
Albumin/Creatinine Ratio, Urine, POC: 40
Creatinine, POC: 89.5 mg/dL
EGFR: 46

## 2024-07-21 NOTE — Progress Notes (Unsigned)
 [facility] History and Physical   Primary Care Physician:  Early, Camie BRAVO, NP   Reason for Procedure:  GERD, anemia  Plan:    Upper endoscopy     HPI: Jessica [name] is a 53 y.o. female undergoing upper endoscopy for investigation of GERD and anemia.  Patient reports worsening symptoms of acid reflux, predominantly in the mornings accompanied by coughing and throat clearing.  Denies dysphagia or odynophagia.  Patient was started on omeprazole  and Pepcid  at the time of recent clinic visit.  1 episode of dark stool but does take iron.  Anemia appears to be a chronic issue dating back to 2023 or previously.  This has been at attributed to menorrhagia.  Noted that the patient also has a history of thalassemia and MGUS.   Past Medical History:  Diagnosis Date   Abdominal bloating 01/29/2024   Acute left-sided thoracic back pain 01/29/2024   Allergy    seasonal   Anemia    Anxiety    Depression    Encounter for long-term (current) use of NSAIDs 01/29/2024   HYPERCHOLESTEROLEMIA 10/07/2007   Qualifier: Diagnosis of  By: Jacqueline Riggs     Hyperlipidemia    MGUS (monoclonal gammopathy of unknown significance)     Past Surgical History:  Procedure Laterality Date   TUBAL LIGATION      Prior to Admission medications   Medication Sig Start Date End Date Taking? Authorizing Provider  ALPRAZolam  (XANAX ) 0.25 MG tablet Take 0.125 - 0.25mg  (1/2 to 1 tab) for anxiety up to twice a day as needed, if Buspar  ineffective. 01/29/24   Early, Sara E, NP  amLODipine -atorvastatin  (CADUET ) 10-40 MG tablet Take 1 tablet by mouth daily. 06/11/24   Early, Sara E, NP  buPROPion  (WELLBUTRIN  XL) 150 MG 24 hr tablet TAKE 1 TABLET BY MOUTH EVERY DAY 06/11/24   Early, Sara E, NP  busPIRone  (BUSPAR ) 5 MG tablet Take 5mg  (1 tablet)  as needed for anxiety up to 3 times in 1 day. 02/06/24   Early, Sara E, NP  cycloSPORINE  (RESTASIS ) 0.05 % ophthalmic emulsion Place 1 drop into both eyes 2 (two)  times daily. 06/24/24   Early, Sara E, NP  famotidine  (PEPCID ) 40 MG tablet TAKE 1 TABLET BY MOUTH EVERYDAY AT BEDTIME 07/15/24   Collier, Amanda R, PA-C  linaclotide  (LINZESS ) 145 MCG CAPS capsule Take 1 capsule (145 mcg total) by mouth daily before breakfast. 06/25/24   Craig Alan SAUNDERS, PA-C  methocarbamol  (ROBAXIN -750) 750 MG tablet Take 1 tablet (750 mg total) by mouth every 8 (eight) hours as needed for muscle spasms. 01/29/24   Early, Sara E, NP  norethindrone-ethinyl estradiol -FE (LOESTRIN FE) 1-20 MG-MCG tablet Take 1 tablet by mouth daily. OK to skip placebo to prevent hormone fluctuations. 06/11/24   Early, Sara E, NP  omeprazole  (PRILOSEC) 40 MG capsule Take 1 capsule (40 mg total) by mouth daily. 06/20/24   Craig Alan SAUNDERS, PA-C  traZODone  (DESYREL ) 50 MG tablet TAKE 1/2 TO 1 TABLET BY MOUTH AT BEDTIME AS NEEDED FOR SLEEP 10/10/23   Early, Sara E, NP  venlafaxine  XR (EFFEXOR -XR) 75 MG 24 hr capsule TAKE 1 TABLET (75MG ) EVERY OTHER DAY ALTERNATING WITH 150MG  VENLAFAXINE  FOR 14 DAYS THEN STOP 150MG  DOSE AND ONLY TAKE 75MG  DOSE DAILY WITH BREAKFAST 07/16/24   Early, Camie BRAVO, NP  Vitamin D , Ergocalciferol , (DRISDOL ) 1.25 MG (50000 UNIT) CAPS capsule TAKE 1 CAPSULE (50,000 UNITS TOTAL) BY MOUTH EVERY 7 (SEVEN) DAYS 06/23/24   Early, Camie BRAVO, NP  Current Outpatient Medications  Medication Sig Dispense Refill   ALPRAZolam  (XANAX ) 0.25 MG tablet Take 0.125 - 0.25mg  (1/2 to 1 tab) for anxiety up to twice a day as needed, if Buspar  ineffective. 30 tablet 1   amLODipine -atorvastatin  (CADUET ) 10-40 MG tablet Take 1 tablet by mouth daily. 30 tablet 3   buPROPion  (WELLBUTRIN  XL) 150 MG 24 hr tablet TAKE 1 TABLET BY MOUTH EVERY DAY 90 tablet 3   busPIRone  (BUSPAR ) 5 MG tablet Take 5mg  (1 tablet)  as needed for anxiety up to 3 times in 1 day. 270 tablet 1   cycloSPORINE  (RESTASIS ) 0.05 % ophthalmic emulsion Place 1 drop into both eyes 2 (two) times daily. 1.5 mL 2   famotidine  (PEPCID ) 40 MG tablet TAKE 1  TABLET BY MOUTH EVERYDAY AT BEDTIME 90 tablet 1   linaclotide  (LINZESS ) 145 MCG CAPS capsule Take 1 capsule (145 mcg total) by mouth daily before breakfast. 30 capsule 2   methocarbamol  (ROBAXIN -750) 750 MG tablet Take 1 tablet (750 mg total) by mouth every 8 (eight) hours as needed for muscle spasms. 90 tablet 2   norethindrone-ethinyl estradiol -FE (LOESTRIN FE) 1-20 MG-MCG tablet Take 1 tablet by mouth daily. OK to skip placebo to prevent hormone fluctuations. 84 tablet 4   omeprazole  (PRILOSEC) 40 MG capsule Take 1 capsule (40 mg total) by mouth daily. 30 capsule 2   traZODone  (DESYREL ) 50 MG tablet TAKE 1/2 TO 1 TABLET BY MOUTH AT BEDTIME AS NEEDED FOR SLEEP 90 tablet 2   venlafaxine  XR (EFFEXOR -XR) 75 MG 24 hr capsule TAKE 1 TABLET (75MG ) EVERY OTHER DAY ALTERNATING WITH 150MG  VENLAFAXINE  FOR 14 DAYS THEN STOP 150MG  DOSE AND ONLY TAKE 75MG  DOSE DAILY WITH BREAKFAST 30 capsule 5   Vitamin D , Ergocalciferol , (DRISDOL ) 1.25 MG (50000 UNIT) CAPS capsule TAKE 1 CAPSULE (50,000 UNITS TOTAL) BY MOUTH EVERY 7 (SEVEN) DAYS 12 capsule 3   No current facility-administered medications for this visit.    Allergies as of 07/22/2024 - Review Complete 06/24/2024  Allergen Reaction Noted   Sulfonamide derivatives      Family History  Problem Relation Age of Onset   Heart disease Mother 24       MI   Hyperlipidemia Mother    Hypertension Mother    Alcohol abuse Father    Drug abuse Father    Hyperlipidemia Father    Hypertension Father    Alcohol abuse Maternal Uncle    Colon polyps Maternal Grandmother    Heart disease Maternal Grandmother    Hypertension Maternal Grandmother    Hyperlipidemia Maternal Grandmother    Cancer Paternal Grandmother    Diabetes Paternal Grandmother    Hypertension Paternal Grandmother    Cancer Paternal Grandfather    Breast cancer Neg Hx    Colon cancer Neg Hx    Esophageal cancer Neg Hx    Stomach cancer Neg Hx    Rectal cancer Neg Hx     Social History    Socioeconomic History   Marital status: Married    Spouse name: Not on file   Number of children: 2   Years of education: Not on file   Highest education level: Some college, no degree  Occupational History   Not on file  Tobacco Use   Smoking status: Never    Passive exposure: Never   Smokeless tobacco: Never  Vaping Use   Vaping status: Never Used  Substance and Sexual Activity   Alcohol use: Never   Drug use: Never  Sexual activity: Yes    Birth control/protection: Surgical  Other Topics Concern   Not on file  Social History Narrative   Not on file   Social Drivers of Health   Financial Resource Strain: Low Risk  (05/13/2024)   Overall Financial Resource Strain (CARDIA)    Difficulty of Paying Living Expenses: Not hard at all  Food Insecurity: No Food Insecurity (05/13/2024)   Hunger Vital Sign    Worried About Running Out of Food in the Last Year: Never true    Ran Out of Food in the Last Year: Never true  Transportation Needs: No Transportation Needs (05/13/2024)   PRAPARE - Administrator, Civil Service (Medical): No    Lack of Transportation (Non-Medical): No  Physical Activity: Inactive (05/13/2024)   Exercise Vital Sign    Days of Exercise per Week: 0 days    Minutes of Exercise per Session: Not on file  Stress: Stress Concern Present (05/13/2024)   [employer] of Occupational Health - Occupational Stress Questionnaire    Feeling of Stress: To some extent  Social Connections: Moderately Isolated (05/13/2024)   Social Connection and Isolation Panel    Frequency of Communication with Friends and Family: More than three times a week    Frequency of Social Gatherings with Friends and Family: Once a week    Attends Religious Services: Never    Database administrator or Organizations: No    Attends Engineer, structural: Not on file    Marital Status: Married  Catering manager Violence: Not on file    Review of Systems:  All other review  of systems negative except as mentioned in the HPI.  Physical Exam: Vital signs There were no vitals taken for this visit.  General:   Alert,  Well-developed, well-nourished, pleasant and cooperative in NAD Airway:  Mallampati  Lungs:  Clear throughout to auscultation.   Heart:  Regular rate and rhythm; no murmurs, clicks, rubs,  or gallops. Abdomen:  Soft, nontender and nondistended. Normal bowel sounds.   Neuro/Psych:  Normal mood and affect. A and O x 3  Inocente Hausen, MD [facility]

## 2024-07-22 ENCOUNTER — Encounter: Payer: Self-pay | Admitting: Pediatrics

## 2024-07-22 ENCOUNTER — Ambulatory Visit: Admitting: Pediatrics

## 2024-07-22 VITALS — BP 114/79 | HR 87 | Temp 97.2°F | Resp 18 | Ht 67.0 in | Wt 177.0 lb

## 2024-07-22 DIAGNOSIS — K3189 Other diseases of stomach and duodenum: Secondary | ICD-10-CM | POA: Diagnosis not present

## 2024-07-22 DIAGNOSIS — D563 Thalassemia minor: Secondary | ICD-10-CM

## 2024-07-22 DIAGNOSIS — K219 Gastro-esophageal reflux disease without esophagitis: Secondary | ICD-10-CM | POA: Diagnosis not present

## 2024-07-22 DIAGNOSIS — D509 Iron deficiency anemia, unspecified: Secondary | ICD-10-CM | POA: Diagnosis not present

## 2024-07-22 DIAGNOSIS — K319 Disease of stomach and duodenum, unspecified: Secondary | ICD-10-CM | POA: Diagnosis not present

## 2024-07-22 MED ORDER — SODIUM CHLORIDE 0.9 % IV SOLN: 500.0000 mL | Freq: Once | INTRAVENOUS | Status: DC

## 2024-07-22 NOTE — Op Note (Signed)
 [facility] Patient Name: Jessica [name] Procedure Date: 07/22/2024 8:21 AM MRN: [id number] Endoscopist: Inocente Hausen , MD, [id number] Age: 53 Referring MD:  Date of Birth: [birth year] Gender: Female Account #: [id number] Procedure:                Upper GI endoscopy Indications:              Heartburn, Follow-up of gastro-esophageal reflux                            disease, Anemia Medicines:                Monitored Anesthesia Care Procedure:                Pre-Anesthesia Assessment:                           - Prior to the procedure, a History and Physical                            was performed, and patient medications and                            allergies were reviewed. The patient's tolerance of                            previous anesthesia was also reviewed. The risks                            and benefits of the procedure and the sedation                            options and risks were discussed with the patient.                            All questions were answered, and informed consent                            was obtained. Prior Anticoagulants: The patient has                            taken no anticoagulant or antiplatelet agents. ASA                            Grade Assessment: II - A patient with mild systemic                            disease. After reviewing the risks and benefits,                            the patient was deemed in satisfactory condition to                            undergo the procedure.  After obtaining informed consent, the endoscope was                            passed under direct vision. Throughout the                            procedure, the patient's blood pressure, pulse, and                            oxygen saturations were monitored continuously. The                            Olympus Scope [id number] was introduced through the                            mouth, and advanced to the second part  of duodenum.                            The upper GI endoscopy was accomplished without                            difficulty. The patient tolerated the procedure                            well. Scope In: Scope Out: Findings:                 The examined esophagus was normal.                           The gastric body, gastric antrum, cardia (on                            retroflexion) and gastric fundus (on retroflexion)                            were normal. Biopsies were taken with a cold                            forceps for Helicobacter pylori testing.                           The duodenal bulb and second portion of the                            duodenum were normal. Complications:            No immediate complications. Estimated blood loss:                            Minimal. Estimated Blood Loss:     Estimated blood loss was minimal. Impression:               - Normal esophagus.                           - Normal  gastric body, antrum, cardia and gastric                            fundus. Biopsied.                           - Normal duodenal bulb and second portion of the                            duodenum. Recommendation:           - Discharge patient to home (ambulatory).                           - Await pathology results.                           - Continue current medications.                           - The findings and recommendations were discussed                            with the patient's family.                           - Return to GI clinic in 6 weeks with Dr. Suzann                            or APP.                           - The findings and recommendations were discussed                            with the patient's family.                           - Patient has a contact number available for                            emergencies. The signs and symptoms of potential                            delayed complications were discussed with the                             patient. Return to normal activities tomorrow.                            Written discharge instructions were provided to the                            patient. Inocente Suzann, MD 07/22/2024 8:54:34 AM This report has been signed electronically.

## 2024-07-22 NOTE — Patient Instructions (Addendum)
 Resume previous diet. Continue present medications. Awaiting pathology results. Follow up appointment scheduled.  YOU HAD AN ENDOSCOPIC PROCEDURE TODAY AT THE [facility]:   Refer to the procedure report that was given to you for any specific questions about what was found during the examination.  If the procedure report does not answer your questions, please call your gastroenterologist to clarify.  If you requested that your care partner not be given the details of your procedure findings, then the procedure report has been included in a sealed envelope for you to review at your convenience later.  YOU SHOULD EXPECT: Some feelings of bloating in the abdomen. Passage of more gas than usual.  Walking can help get rid of the air that was put into your GI tract during the procedure and reduce the bloating. If you had a lower endoscopy (such as a colonoscopy or flexible sigmoidoscopy) you may notice spotting of blood in your stool or on the toilet paper. If you underwent a bowel prep for your procedure, you may not have a normal bowel movement for a few days.  Please Note:  You might notice some irritation and congestion in your nose or some drainage.  This is from the oxygen used during your procedure.  There is no need for concern and it should clear up in a day or so.  SYMPTOMS TO REPORT IMMEDIATELY:  Following upper endoscopy (EGD)  Vomiting of blood or coffee ground material  New chest pain or pain under the shoulder blades  Painful or persistently difficult swallowing  New shortness of breath  Fever of 100F or higher  Black, tarry-looking stools  For urgent or emergent issues, a gastroenterologist can be reached at any hour by calling [phone]. Do not use MyChart messaging for urgent concerns.    DIET:  We do recommend a small meal at first, but then you may proceed to your regular diet.  Drink plenty of fluids but you should avoid alcoholic beverages for 24  hours.  ACTIVITY:  You should plan to take it easy for the rest of today and you should NOT DRIVE or use heavy machinery until tomorrow (because of the sedation medicines used during the test).    FOLLOW UP: Our staff will call the number listed on your records the next business day following your procedure.  We will call around 7:15- 8:00 am to check on you and address any questions or concerns that you may have regarding the information given to you following your procedure. If we do not reach you, we will leave a message.     If any biopsies were taken you will be contacted by phone or by letter within the next 1-3 weeks.  Please call us  at [phone] if you have not heard about the biopsies in 3 weeks.    SIGNATURES/CONFIDENTIALITY: You and/or your care partner have signed paperwork which will be entered into your electronic medical record.  These signatures attest to the fact that that the information above on your After Visit Summary has been reviewed and is understood.  Full responsibility of the confidentiality of this discharge information lies with you and/or your care-partner.

## 2024-07-22 NOTE — Progress Notes (Signed)
 Vss nad trans to pacu

## 2024-07-22 NOTE — Progress Notes (Signed)
 Called to room to assist during endoscopic procedure.  Patient ID and intended procedure confirmed with present staff. Received instructions for my participation in the procedure from the performing physician.

## 2024-07-23 ENCOUNTER — Telehealth: Payer: Self-pay

## 2024-07-23 NOTE — Telephone Encounter (Signed)
  Follow up Call-     07/22/2024    8:11 AM 03/17/2022    7:24 AM  Call back number  Post procedure Call Back phone  # [phone]  Permission to leave phone message Yes Yes     Patient questions:  Do you have a fever, pain , or abdominal swelling? No. Pain Score  0 *  Have you tolerated food without any problems? Yes.    Have you been able to return to your normal activities? Yes.    Do you have any questions about your discharge instructions: Diet   No. Medications  No. Follow up visit  No.  Do you have questions or concerns about your Care? No.  Actions: * If pain score is 4 or above: No action needed, pain <4.

## 2024-07-24 LAB — SURGICAL PATHOLOGY

## 2024-07-29 ENCOUNTER — Encounter: Payer: Self-pay | Admitting: Nurse Practitioner

## 2024-07-29 ENCOUNTER — Ambulatory Visit: Payer: Self-pay | Admitting: Pediatrics

## 2024-07-30 ENCOUNTER — Other Ambulatory Visit: Payer: Self-pay

## 2024-07-30 DIAGNOSIS — E782 Mixed hyperlipidemia: Secondary | ICD-10-CM

## 2024-07-30 DIAGNOSIS — I10 Essential (primary) hypertension: Secondary | ICD-10-CM

## 2024-07-30 MED ORDER — AMLODIPINE-ATORVASTATIN 10-40 MG PO TABS: 0.5000 | ORAL_TABLET | Freq: Every day | ORAL | 3 refills | Status: DC

## 2024-08-05 ENCOUNTER — Encounter: Payer: Self-pay | Admitting: Physical Therapy

## 2024-08-05 ENCOUNTER — Encounter: Attending: Physician Assistant | Admitting: Physical Therapy

## 2024-08-05 DIAGNOSIS — R252 Cramp and spasm: Secondary | ICD-10-CM | POA: Diagnosis not present

## 2024-08-05 DIAGNOSIS — R278 Other lack of coordination: Secondary | ICD-10-CM | POA: Diagnosis not present

## 2024-08-05 NOTE — Therapy (Signed)
 OUTPATIENT PHYSICAL THERAPY FEMALE PELVIC TREATMENT   Patient Name: Jessica [name] MRN: [id number] DOB:Dec 10, 1970, 53 y.o., female Today's Date: 08/05/2024  END OF SESSION:  PT End of Session - 08/05/24 0933     Visit Number 2    Date for Recertification  11/24/24    Authorization Type BCBS    PT Start Time 0930    PT Stop Time 1015    PT Time Calculation (min) 45 min    Activity Tolerance Patient tolerated treatment well    Behavior During Therapy [facility] for tasks assessed/performed          Past Medical History:  Diagnosis Date   Abdominal bloating 01/29/2024   Acute left-sided thoracic back pain 01/29/2024   Allergy    seasonal   Anemia    Anxiety    Depression    Encounter for long-term (current) use of NSAIDs 01/29/2024   GERD (gastroesophageal reflux disease)    HYPERCHOLESTEROLEMIA 10/07/2007   Qualifier: Diagnosis of  By: Jacqueline Riggs     Hyperlipidemia    MGUS (monoclonal gammopathy of unknown significance)    Past Surgical History:  Procedure Laterality Date   TUBAL LIGATION     Patient Active Problem List   Diagnosis Date Noted   Dry eye syndrome of both eyes 06/24/2024   Leg swelling 06/24/2024   Decreased calculated GFR 06/24/2024   Gastroesophageal reflux disease 06/24/2024   Encounter for annual physical exam 06/09/2024   Health belief conflict 06/09/2024   Vitamin D  deficiency 02/19/2023   Perimenopause 01/26/2023   Low libido 01/26/2023   Deformity of nail bed 01/26/2023   Mixed hyperlipidemia 01/19/2022   Essential hypertension 01/19/2022   Primary insomnia 01/19/2022   MGUS (monoclonal gammopathy of unknown significance) 11/11/2018   Beta thalassemia minor 08/12/2018   Major depressive disorder, recurrent episode, moderate (HCC) 12/04/2012   Social anxiety disorder 07/15/2008   ANEMIA, IRON DEFICIENCY, UNSPEC. 01/10/2007    PCP: [name], NP  REFERRING PROVIDER: Craig Alan SAUNDERS, PA-C  REFERRING DIAG: [phone] (ICD-10-CM) -  Chronic idiopathic constipation   THERAPY DIAG:  Cramp and spasm  Other lack of coordination  Rationale for Evaluation and Treatment: Rehabilitation  ONSET DATE: 2025  SUBJECTIVE:                                                                                                                                                                                           SUBJECTIVE STATEMENT: I have gotten the vaginal moisturizers. I am going to have a bowel movement 1-2 days and not straining.    Fluid intake: water, tea  PAIN:  Are you having  pain? No  PRECAUTIONS: None  RED FLAGS: None   WEIGHT BEARING RESTRICTIONS: No  FALLS:  Has patient fallen in last 6 months? 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  ACTIVITY LEVEL : low activity  PLOF: Independent  PATIENT GOALS: ways to relief constipation  PERTINENT HISTORY:  none Sexual abuse: No  BOWEL MOVEMENT: Pain with bowel movement: No Type of bowel movement:Type (Bristol Stool Scale) Type 2, type 4 but with linzess  is Type 7, Frequency every 3 days, Strain yes, and Splinting yes Fully empty rectum: No Leakage: No Fiber supplement/laxative Linzess   URINATION: Pain with urination: No Fully empty bladder: Yes:   Stream: Strong Urgency: Yes  Frequency: every 2-3 hours, wake 1 time Leakage: none Pads: No  INTERCOURSE:not active right now does not have desire due to menopause  Ability to have vaginal penetration Yes   PREGNANCY: Vaginal deliveries 2 Tearing Yes: few stitches  PROLAPSE: None   OBJECTIVE:  Note: Objective measures were completed at Evaluation unless otherwise noted.  DIAGNOSTIC FINDINGS:  none  PATIENT SURVEYS:  PFIQ-7: 17 CRAIQ-7: 19  COGNITION: Overall cognitive status: Within functional limits for tasks assessed     SENSATION: Light touch: Appears intact   FUNCTIONAL TESTS:  SLS on right is 3 sec, left is 10 sec   POSTURE: rounded shoulders and forward  head   LUMBARAROM/PROM:lumbar ROM is full   LOWER EXTREMITY MNF:apojuzmjo hip ROM is full   LOWER EXTREMITY MMT:  MMT Right eval Left eval  Hip abduction 4/5 5/5  Hip adduction 4/5 4/5   (Blank rows = not tested) PALPATION:   Abdominal: patient will contract the higher abdominal compared to the lower                External Perineal Exam: tightness along the perineal body and left external sphincter                             Internal Pelvic Floor: restrictions around the puborectalis posteriorly, alon gthe let mid rectum, and anterior rectum  Patient confirms identification and approves PT to assess internal pelvic floor and treatment Yes No emotional/communication barriers or cognitive limitation. Patient is motivated to learn. Patient understands and agrees with treatment goals and plan. PT explains patient will be examined in standing, sitting, and lying down to see how their muscles and joints work. When they are ready, they will be asked to remove their underwear so PT can examine their perineum. The patient is also given the option of providing their own chaperone as one is not provided in our facility. The patient also has the right and is explained the right to defer or refuse any part of the evaluation or treatment including the internal exam. With the patient's consent, PT will use one gloved finger to gently assess the muscles of the pelvic floor, seeing how well it contracts and relaxes and if there is muscle symmetry. After, the patient will get dressed and PT and patient will discuss exam findings and plan of care. PT and patient discuss plan of care, schedule, attendance policy and HEP activities.   PELVIC MMT:   MMT eval  Internal Anal Sphincter 3/5  External Anal Sphincter 3/5  Puborectalis 3/5  (Blank rows = not tested)         TODAY'S TREATMENT:       08/05/24 Manual: Soft tissue mobilization: Circular abdominal massage to improve peristalic motion of the  intestines  and educated patient on how to perform at home Manual work to the diaphragm to prepare for diaphragmatic breathing Myofascial release: Tissue rolling of the abdomen Manual work along the mesenteric root, where the large intestines and smaller attach, along the lower abdomen and umbilicus Neuromuscular re-education: Core facilitation: Supine hip flexion isometric with contraction of the lower abdomen 5 times each side Down training: Diaphragmatic breathing with opening the lower rib cage then the abdomen to relax the pelvic floor Exercises: Stretches/mobility: Supine piriformis stretch holding 30 sec 2 times each side Self-care: Discussed with patient on vaginal moisturizers, how she is using them and which ones she has bought.                                                                                                                            DATE: 06/24/24  EVAL Therapeutic activities: Educated patient on how to sit on the commode with knees higher than the hips to relax the puborectalis, breathing to relax the pelvic floor than breathing to generate pressure to push stool out Self-care: Educated patient on vaginal moisturizers to reduce the dryness in the vulvar area and educated her on vaginal health to improve the tissue Educated patient on abdominal massage to improve the peristalic motion of the intestines so stool can move through easier      PATIENT EDUCATION:  08/05/24 Education details: Access Code: [email or web address] Person educated: Patient Education method: Explanation, Demonstration, Tactile cues, Verbal cues, and Handouts Education comprehension: verbalized understanding, returned demonstration, verbal cues required, tactile cues required, and needs further education  HOME EXERCISE PROGRAM: 08/05/24 Access Code: [email or web address] URL: [email or web address] Date: 08/05/2024 Prepared by: Channing Pereyra  Exercises - Supine Diaphragmatic Breathing  - 1 x  daily - 7 x weekly - 3 sets - 10 reps - Seated Diaphragmatic Breathing  - 1 x daily - 7 x weekly - 3 sets - 10 reps - Hooklying Isometric Hip Flexion  - 1 x daily - 7 x weekly - 1 sets - 10 reps - Supine Figure 4 Piriformis Stretch with Leg Extension  - 1 x daily - 7 x weekly - 1 sets - 2 reps - 30 sec hold  Patient Education - Abdominal Massage for Constipation    ASSESSMENT:  CLINICAL IMPRESSION: Patient is a 53 y.o. female who was seen today for physical therapy evaluation and treatment for constipation. Patient is using the vaginal moisturizers and sees a difference. She is using the stool for a bowel movement and it si helping. She restrictions in thea abdomen and understands how to work them out.  She is leaning how to massage her abdomen to improve the peristalic motion of the intestines. Patient will benefit from skilled therapy to reduce straining and pressure on the pelvic floor and coordination.   OBJECTIVE IMPAIRMENTS: decreased coordination, decreased strength, increased fascial restrictions, and increased muscle spasms.   ACTIVITY LIMITATIONS: toileting  PARTICIPATION LIMITATIONS: shopping and community activity  PERSONAL FACTORS: Time since onset  of injury/illness/exacerbation are also affecting patient's functional outcome.   REHAB POTENTIAL: Excellent  CLINICAL DECISION MAKING: Evolving/moderate complexity  EVALUATION COMPLEXITY: Moderate   GOALS: Goals reviewed with patient? Yes  SHORT TERM GOALS: Target date: 06/24/24  Patient independent with initial HEP to engage her core and lengthening her pelvic floor.  Baseline: Goal status: INITIAL  2.  Patient is able to perform diaphragmatic breathing to relax her pelvic floor for bowel movement.  Baseline:  Goal status: INITIAL  3.  Patient educated on vaginal dryness to improve perineal tissue health and how to use moisturizers.  Baseline:  Goal status: Met 08/05/24  4.  Patient educated on abdominal massage  to improve peristalic motion of the intestines.  Baseline:  Goal status: Met 08/05/24   LONG TERM GOALS: Target date: 11/24/24  Patient independent with advanced HEP for coordination of the pelvic floor to improve bowel movements.  Baseline:  Goal status: INITIAL  2.  Patient is able to not strain to have a bowel movement 4 out of 5 times due to the improved coordination of the pelvic floor muscles.  Baseline:  Goal status: INITIAL  3.  Patient is able to feel comfortable to go out in public due to reduction of bloating from constipation.  Baseline:  Goal status: INITIAL  4.  CRAIQ-7 score decreased </= 5 compared to  19 Baseline:  Goal status: INITIAL   PLAN:  PT FREQUENCY: 1x/week  PT DURATION: other: 5 months  PLANNED INTERVENTIONS: 97110-Therapeutic exercises, 97530- Therapeutic activity, 97112- Neuromuscular re-education, 97535- Self Care, [id number]- Manual therapy, Joint mobilization, Spinal mobilization, and Biofeedback  PLAN FOR NEXT SESSION: manual work to the abdomen;  hip stretches to elongate the pelvic floor, core engagement, single leg stance exercises   Channing Pereyra, PT 08/05/24 10:24 AM

## 2024-08-19 ENCOUNTER — Encounter: Payer: Self-pay | Admitting: Physical Therapy

## 2024-08-19 ENCOUNTER — Encounter: Admitting: Nurse Practitioner

## 2024-08-19 ENCOUNTER — Encounter: Attending: Physician Assistant | Admitting: Physical Therapy

## 2024-08-19 DIAGNOSIS — R252 Cramp and spasm: Secondary | ICD-10-CM | POA: Diagnosis not present

## 2024-08-19 DIAGNOSIS — R278 Other lack of coordination: Secondary | ICD-10-CM | POA: Insufficient documentation

## 2024-08-19 NOTE — Therapy (Signed)
 OUTPATIENT PHYSICAL THERAPY FEMALE PELVIC TREATMENT   Patient Name: Jessica [name] MRN: [id number] DOB:[birth year], 53 y.o., female Today's Date: 08/19/2024  END OF SESSION:  PT End of Session - 08/19/24 0934     Visit Number 3    Date for Recertification  11/24/24    Authorization Type BCBS    PT Start Time 0930    PT Stop Time 1015    PT Time Calculation (min) 45 min    Activity Tolerance Patient tolerated treatment well    Behavior During Therapy [facility] for tasks assessed/performed          Past Medical History:  Diagnosis Date   Abdominal bloating 01/29/2024   Acute left-sided thoracic back pain 01/29/2024   Allergy    seasonal   Anemia    Anxiety    Depression    Encounter for long-term (current) use of NSAIDs 01/29/2024   GERD (gastroesophageal reflux disease)    HYPERCHOLESTEROLEMIA 10/07/2007   Qualifier: Diagnosis of  By: Jacqueline Riggs     Hyperlipidemia    MGUS (monoclonal gammopathy of unknown significance)    Past Surgical History:  Procedure Laterality Date   TUBAL LIGATION     Patient Active Problem List   Diagnosis Date Noted   Dry eye syndrome of both eyes 06/24/2024   Leg swelling 06/24/2024   Decreased calculated GFR 06/24/2024   Gastroesophageal reflux disease 06/24/2024   Encounter for annual physical exam 06/09/2024   Health belief conflict 06/09/2024   Vitamin D  deficiency 02/19/2023   Perimenopause 01/26/2023   Low libido 01/26/2023   Deformity of nail bed 01/26/2023   Mixed hyperlipidemia 01/19/2022   Essential hypertension 01/19/2022   Primary insomnia 01/19/2022   MGUS (monoclonal gammopathy of unknown significance) 11/11/2018   Beta thalassemia minor 08/12/2018   Major depressive disorder, recurrent episode, moderate (HCC) 12/04/2012   Social anxiety disorder 07/15/2008   ANEMIA, IRON DEFICIENCY, UNSPEC. 01/10/2007    PCP: [name], NP  REFERRING PROVIDER: Craig Alan SAUNDERS, PA-C  REFERRING DIAG: [phone] (ICD-10-CM) -  Chronic idiopathic constipation   THERAPY DIAG:  Cramp and spasm  Other lack of coordination  Rationale for Evaluation and Treatment: Rehabilitation  ONSET DATE: 2025  SUBJECTIVE:                                                                                                                                                                                           SUBJECTIVE STATEMENT: The abdominal massage has been helping. I have been stretching my whole body.  Fluid intake: water, tea  PAIN:  Are you having pain? No  PRECAUTIONS: None  RED FLAGS:  None   WEIGHT BEARING RESTRICTIONS: No  FALLS:  Has patient fallen in last 6 months? 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  ACTIVITY LEVEL : low activity  PLOF: Independent  PATIENT GOALS: ways to relief constipation  PERTINENT HISTORY:  none Sexual abuse: No  BOWEL MOVEMENT: Pain with bowel movement: No Type of bowel movement:Type (Bristol Stool Scale) Type 2, type 4 but with linzess  is Type 7, Frequency every 3 days, Strain yes, and Splinting yes Fully empty rectum: No Leakage: No Fiber supplement/laxative Linzess   URINATION: Pain with urination: No Fully empty bladder: Yes:   Stream: Strong Urgency: Yes  Frequency: every 2-3 hours, wake 1 time Leakage: none Pads: No  INTERCOURSE:not active right now does not have desire due to menopause  Ability to have vaginal penetration Yes   PREGNANCY: Vaginal deliveries 2 Tearing Yes: few stitches  PROLAPSE: None   OBJECTIVE:  Note: Objective measures were completed at Evaluation unless otherwise noted.  DIAGNOSTIC FINDINGS:  none  PATIENT SURVEYS:  PFIQ-7: 17 CRAIQ-7: 19  COGNITION: Overall cognitive status: Within functional limits for tasks assessed     SENSATION: Light touch: Appears intact   FUNCTIONAL TESTS:  SLS on right is 3 sec, left is 10 sec   POSTURE: rounded shoulders and forward head   LUMBARAROM/PROM:lumbar  ROM is full   LOWER EXTREMITY MNF:apojuzmjo hip ROM is full   LOWER EXTREMITY MMT:  MMT Right eval Left eval  Hip abduction 4/5 5/5  Hip adduction 4/5 4/5   (Blank rows = not tested) PALPATION:   Abdominal: patient will contract the higher abdominal compared to the lower                External Perineal Exam: tightness along the perineal body and left external sphincter                             Internal Pelvic Floor: restrictions around the puborectalis posteriorly, along the let mid rectum, and anterior rectum  Patient confirms identification and approves PT to assess internal pelvic floor and treatment Yes No emotional/communication barriers or cognitive limitation. Patient is motivated to learn. Patient understands and agrees with treatment goals and plan. PT explains patient will be examined in standing, sitting, and lying down to see how their muscles and joints work. When they are ready, they will be asked to remove their underwear so PT can examine their perineum. The patient is also given the option of providing their own chaperone as one is not provided in our facility. The patient also has the right and is explained the right to defer or refuse any part of the evaluation or treatment including the internal exam. With the patient's consent, PT will use one gloved finger to gently assess the muscles of the pelvic floor, seeing how well it contracts and relaxes and if there is muscle symmetry. After, the patient will get dressed and PT and patient will discuss exam findings and plan of care. PT and patient discuss plan of care, schedule, attendance policy and HEP activities.  08/19/24: Patient refuses for therapist to do internal rectal work to work on restrictions.   PELVIC MMT:   MMT eval  Internal Anal Sphincter 3/5  External Anal Sphincter 3/5  Puborectalis 3/5  (Blank rows = not tested)         TODAY'S TREATMENT:       08/19/24 Neuromuscular re-education: Core  facilitation: Supine hip flexion  isometric 10 x each side to contract the lower abdominals Supine marching with abdominal contraction 20 x  Supine alternate shoulder and hip flexion 20 x with core engaged Standing Pallof with green band 15 x each way Down training: Educated patient on diaphragmatic breathing in sitting without lifting her chest, filling up the abdomen naturally and relaxing the pelvic floor Educated patient on urge to void to assist with urinary urgency so she can slowly walk to the bathroom.  Exercises: Stretches/mobility: Happy baby with diaphragmatic breathing Piriformis stretch in supine holding 30 sec bilaterally Self-care: Reviewed vaginal moisturizers and how it helps the vaginal tissue    08/05/24 Manual: Soft tissue mobilization: Circular abdominal massage to improve peristalic motion of the intestines and educated patient on how to perform at home Manual work to the diaphragm to prepare for diaphragmatic breathing Myofascial release: Tissue rolling of the abdomen Manual work along the mesenteric root, where the large intestines and smaller attach, along the lower abdomen and umbilicus Neuromuscular re-education: Core facilitation: Supine hip flexion isometric with contraction of the lower abdomen 5 times each side Down training: Diaphragmatic breathing with opening the lower rib cage then the abdomen to relax the pelvic floor Exercises: Stretches/mobility: Supine piriformis stretch holding 30 sec 2 times each side Self-care: Discussed with patient on vaginal moisturizers, how she is using them and which ones she has bought.                                                                                                                            DATE: 06/24/24  EVAL Therapeutic activities: Educated patient on how to sit on the commode with knees higher than the hips to relax the puborectalis, breathing to relax the pelvic floor than breathing to generate  pressure to push stool out Self-care: Educated patient on vaginal moisturizers to reduce the dryness in the vulvar area and educated her on vaginal health to improve the tissue Educated patient on abdominal massage to improve the peristalic motion of the intestines so stool can move through easier      PATIENT EDUCATION:  08/19/24 Education details: Access Code: [email or web address], urge to void Person educated: Patient Education method: Explanation, Demonstration, Tactile cues, Verbal cues, and Handouts Education comprehension: verbalized understanding, returned demonstration, verbal cues required, tactile cues required, and needs further education  HOME EXERCISE PROGRAM: 08/19/24 Access Code: [email or web address] URL: [email or web address] Date: 08/19/2024 Prepared by: Channing Pereyra  Program Notes sit on vibration plate and feel the pelvic floor relax and do for 1 minute  Exercises - Supine Diaphragmatic Breathing  - 1 x daily - 7 x weekly - 3 sets - 10 reps - Seated Diaphragmatic Breathing  - 1 x daily - 7 x weekly - 3 sets - 10 reps - Supine Figure 4 Piriformis Stretch with Leg Extension  - 1 x daily - 7 x weekly - 1 sets - 2 reps - 30 sec hold - Happy Baby with Pelvic Floor Lengthening  -  1 x daily - 7 x weekly - 1 sets - 1 reps - 30-60 seconds hold - Dead Bug  - 1 x daily - 3 x weekly - 2 sets - 10 reps - Quadruped Pelvic Floor Contraction with Opposite Arm and Leg Lift  - 1 x daily - 3 x weekly - 2 sets - 10 reps - Standing Anti-Rotation Press with Anchored Resistance  - 1 x daily - 3 x weekly - 1 sets - 10 reps  Patient Education - Abdominal Massage for Constipation    ASSESSMENT:  CLINICAL IMPRESSION: Patient is a 53 y.o. female who was seen today for physical therapy evaluation and treatment for constipation. Patient has not had bloating. She is increasing her water intake.  Patient is having difficulty with urgency for urination. She is able to do diaphragmatic breathing in  sitting and feel the pelvic floor relax.  She is learning how to strengthen her core to assist with pushing stool out. Patient will strain at times due to the stool just at the edge of the anus. Patient will benefit from skilled therapy to reduce straining and pressure on the pelvic floor and coordination.   OBJECTIVE IMPAIRMENTS: decreased coordination, decreased strength, increased fascial restrictions, and increased muscle spasms.   ACTIVITY LIMITATIONS: toileting  PARTICIPATION LIMITATIONS: shopping and community activity  PERSONAL FACTORS: Time since onset of injury/illness/exacerbation are also affecting patient's functional outcome.   REHAB POTENTIAL: Excellent  CLINICAL DECISION MAKING: Evolving/moderate complexity  EVALUATION COMPLEXITY: Moderate   GOALS: Goals reviewed with patient? Yes  SHORT TERM GOALS: Target date: 06/24/24  Patient independent with initial HEP to engage her core and lengthening her pelvic floor.  Baseline: Goal status: Met 08/19/24  2.  Patient is able to perform diaphragmatic breathing to relax her pelvic floor for bowel movement.  Baseline:  Goal status: Met 08/19/24  3.  Patient educated on vaginal dryness to improve perineal tissue health and how to use moisturizers.  Baseline:  Goal status: Met 08/05/24  4.  Patient educated on abdominal massage to improve peristalic motion of the intestines.  Baseline:  Goal status: Met 08/05/24   LONG TERM GOALS: Target date: 11/24/24  Patient independent with advanced HEP for coordination of the pelvic floor to improve bowel movements.  Baseline:  Goal status: INITIAL  2.  Patient is able to not strain to have a bowel movement 4 out of 5 times due to the improved coordination of the pelvic floor muscles.  Baseline:  Goal status: INITIAL  3.  Patient is able to feel comfortable to go out in public due to reduction of bloating from constipation.  Baseline:  Goal status: Met 08/19/24  4.  CRAIQ-7 score  decreased </= 5 compared to  19 Baseline:  Goal status: INITIAL   PLAN:  PT FREQUENCY: 1x/week  PT DURATION: other: 5 months  PLANNED INTERVENTIONS: 97110-Therapeutic exercises, 97530- Therapeutic activity, 97112- Neuromuscular re-education, 97535- Self Care, 02859- Manual therapy, Joint mobilization, Spinal mobilization, and Biofeedback  PLAN FOR NEXT SESSION: see how the urinary urgency is doing,  core engagement, single leg stance exercises, if doing well then discharge   Channing Pereyra, PT 08/19/24 10:20 AM

## 2024-08-19 NOTE — Patient Instructions (Signed)
 Urge Incontinence  Ideal urination frequency is every 2-4 wakeful hours, which equates to 5-8 times within a 24-hour period.   Urge incontinence is leakage that occurs when the bladder muscle contracts, creating a sudden need to go before getting to the bathroom.   Going too often when your bladder isn't actually full can disrupt the body's automatic signals to store and hold urine longer, which will increase urgency/frequency.  In this case, the bladder "is running the show" and strategies can be learned to retrain this pattern.   One should be able to control the first urge to urinate, at around .  The bladder can hold up to a "grande latte," or . To help you gain control, practice the Urge Drill below when urgency strikes.  This drill will help retrain your bladder signals and allow you to store and hold urine longer.  The overall goal is to stretch out your time between voids to reach a more manageable voiding schedule.    Practice your quick flicks often throughout the day (each waking hour) even when you don't need feel the urge to go.  This will help strengthen your pelvic floor muscles, making them more effective in controlling leakage.  Urge Drill  When you feel an urge to go, follow these steps to regain control: Stop what you are doing and be still Take one deep breath, directing your air into your abdomen Think an affirming thought, such as "I've got this." Do 5 quick flicks of your pelvic floor Then raise heels and let them hit the ground hard 5 times Walk with control to the bathroom to void, or delay voiding Repeat of you get the urge again.   Channing Pereyra, PT [facility] [address] [place], [place] [address] W: [phone] [email or web address]

## 2024-08-28 ENCOUNTER — Encounter: Admitting: Physical Therapy

## 2024-09-02 NOTE — Progress Notes (Addendum)
 "  Brandon Scarbrough [id number] Mar 09, 1971   Chief Complaint: GERD  Referring Provider: [name], NP Primary GI MD: Dr. Suzann  HPI: Thelma Lorenzetti is a 53 y.o. female with past medical history of anxiety/depression, anemia, GERD, hyperlipidemia, MGUS who presents today for follow-up.    Seen in office 06/20/2024 by Alan Coombs, PA-C for GERD and constipation.  Endorsed having reflux daily with associated throat clearing, regurgitation, 1 episode of black stools without use of Pepto-Bismol.  She was noted to be on an iron supplement.  Denied dysphagia or weight loss.  Last used NSAIDs 3 weeks prior and has been using 1-2 times a week, no alcohol or tobacco use.  30 days of PPI did not help.  Recently been started on Wellbutrin .  She was scheduled for an EGD to further evaluate, advised to avoid NSAIDs, alcohol, other lifestyle modifications.  Plan was to consider imaging or GES if EGD normal.  Advised to addition of omeprazole  in the morning and Pepcid  at night, alginate therapy given as well. For constipation was advised to increase fiber and water intake, decrease caffeine, increase activity level, and was given Linzess  samples with consideration for pelvic floor PT referral.  Labs showed stable anemia (following with hematology), negative for celiac disease, stable kidney function, normal liver and electrolytes.  Underwent EGD 07/22/2024 with biopsies of the stomach showing mild reactive gastropathy and otherwise normal exam, path negative for H. Pylori.  She was advised to continue omeprazole  and famotidine .   Discussed the use of AI scribe software for clinical note transcription with the patient, who gave verbal consent to proceed.  History of Present Illness Javier Mamone is a 53 year old female with gastroesophageal reflux disease (GERD) who presents for follow-up.  She has a history of GERD characterized by reflux and regurgitation. Initial treatment with pantoprazole  was  ineffective, leading to a switch to omeprazole  and famotidine . Currently, she takes omeprazole  once daily and famotidine  at night, which has significantly improved her symptoms, with no breakthrough symptoms. No difficulty swallowing is reported. An upper endoscopy was performed, and the patient recalls being told that everything looked good and that all biopsies were benign.  She experiences constipation and was given samples of Linzess  145 mcg. Some improvement is noted, but bowel movements occur every other day, sometimes skipping two days. Prior to Linzess , she could go three days without a bowel movement and experienced straining with hard stools. No blood in stool or significant abdominal pain is reported. She is not currently taking a fiber supplement.  Still having some occasional straining.  Persistent bad breath has been an issue since her twenties, suspected to be related to acid reflux. Despite good oral hygiene, including brushing, flossing, and using mouthwash, bad breath persists throughout the day. She has no tonsils and denies smoking or alcohol use. A history of mucus in her throat, which has improved with reflux treatment, is noted.   Previous GI Procedures/Imaging   EGD 07/22/2024 - Normal esophagus.  - Normal gastric body, antrum, cardia and gastric fundus. Biopsied.  - Normal duodenal bulb and second portion of the duodenum. Path: 1. Surgical [P], gastric :       - MILD REACTIVE GASTROPATHY.       - NEGATIVE FOR H. PYLORI ON H&E STAIN       - NO INTESTINAL METAPLASIA, DYSPLASIA, OR MALIGNANCY.   Colonoscopy 03/17/2022 - Non-bleeding internal hemorrhoids.  - Redundant colon.  - No specimens collected. - Recall 10 years  Past  Medical History:  Diagnosis Date   Abdominal bloating 01/29/2024   Acute left-sided thoracic back pain 01/29/2024   Allergy    seasonal   Anemia    Anxiety    Depression    Encounter for long-term (current) use of NSAIDs 01/29/2024   GERD  (gastroesophageal reflux disease)    HYPERCHOLESTEROLEMIA 10/07/2007   Qualifier: Diagnosis of  By: Jacqueline Riggs     Hyperlipidemia    MGUS (monoclonal gammopathy of unknown significance)     Past Surgical History:  Procedure Laterality Date   TUBAL LIGATION      Current Outpatient Medications  Medication Sig Dispense Refill   amLODipine -atorvastatin  (CADUET ) 10-40 MG tablet Take 0.5 tablets by mouth daily. 30 tablet 3   buPROPion  (WELLBUTRIN  XL) 150 MG 24 hr tablet TAKE 1 TABLET BY MOUTH EVERY DAY 90 tablet 3   cycloSPORINE  (RESTASIS ) 0.05 % ophthalmic emulsion Place 1 drop into both eyes 2 (two) times daily. 1.5 mL 2   famotidine  (PEPCID ) 40 MG tablet TAKE 1 TABLET BY MOUTH EVERYDAY AT BEDTIME 90 tablet 1   linaclotide  (LINZESS ) 145 MCG CAPS capsule Take 1 capsule (145 mcg total) by mouth daily before breakfast. 30 capsule 2   losartan (COZAAR) 25 MG tablet      norethindrone-ethinyl estradiol -FE (LOESTRIN FE) 1-20 MG-MCG tablet Take 1 tablet by mouth daily. OK to skip placebo to prevent hormone fluctuations. 84 tablet 4   omeprazole  (PRILOSEC) 40 MG capsule Take 1 capsule (40 mg total) by mouth daily. 30 capsule 2   traZODone  (DESYREL ) 50 MG tablet TAKE 1/2 TO 1 TABLET BY MOUTH AT BEDTIME AS NEEDED FOR SLEEP 90 tablet 2   venlafaxine  XR (EFFEXOR -XR) 75 MG 24 hr capsule TAKE 1 TABLET (75MG ) EVERY OTHER DAY ALTERNATING WITH 150MG  VENLAFAXINE  FOR 14 DAYS THEN STOP 150MG  DOSE AND ONLY TAKE 75MG  DOSE DAILY WITH BREAKFAST 30 capsule 5   Vitamin D , Ergocalciferol , (DRISDOL ) 1.25 MG (50000 UNIT) CAPS capsule TAKE 1 CAPSULE (50,000 UNITS TOTAL) BY MOUTH EVERY 7 (SEVEN) DAYS 12 capsule 3   No current facility-administered medications for this visit.    Allergies as of 09/03/2024 - Review Complete 09/03/2024  Allergen Reaction Noted   Sulfonamide derivatives Hives     Family History  Problem Relation Age of Onset   Heart disease Mother 89       MI   Hyperlipidemia Mother     Hypertension Mother    Alcohol abuse Father    Drug abuse Father    Hyperlipidemia Father    Hypertension Father    Alcohol abuse Maternal Uncle    Colon polyps Maternal Grandmother    Heart disease Maternal Grandmother    Hypertension Maternal Grandmother    Hyperlipidemia Maternal Grandmother    Cancer Paternal Grandmother    Diabetes Paternal Grandmother    Hypertension Paternal Grandmother    Cancer Paternal Grandfather    Breast cancer Neg Hx    Colon cancer Neg Hx    Esophageal cancer Neg Hx    Stomach cancer Neg Hx    Rectal cancer Neg Hx     Social History   Tobacco Use   Smoking status: Never    Passive exposure: Never   Smokeless tobacco: Never  Vaping Use   Vaping status: Never Used  Substance Use Topics   Alcohol use: Never   Drug use: Never     Review of Systems:    Constitutional: No weight loss, fever, chills, weakness or fatigue Cardiovascular: No chest  pain Respiratory: No SOB  Gastrointestinal: See HPI and otherwise negative   Physical Exam:  Vital signs: BP (!) 120/52 (BP Location: Left Arm, Patient Position: Sitting, Cuff Size: Normal)   Pulse 100   Ht 5' 6 (1.676 m)   Wt 173 lb (78.5 kg)   LMP 08/28/2023   BMI 27.92 kg/m   Constitutional: Pleasant, well-appearing female in NAD, alert and cooperative Head:  Normocephalic and atraumatic.  Eyes: No scleral icterus. Mouth: No oral lesions.  Some gum recession noted on upper molars.  No halitosis is appreciated. Respiratory: Respirations even and unlabored. Lungs clear to auscultation bilaterally.  No wheezes, crackles, or rhonchi.  Cardiovascular:  Regular rate and rhythm. No murmurs. No peripheral edema. Gastrointestinal:  Soft, nondistended, nontender. No rebound or guarding. Normal bowel sounds. No appreciable masses or hepatomegaly. Rectal:  Not performed.  Neurologic:  Alert and oriented x4;  grossly normal neurologically.  Skin:   Dry and intact without significant lesions or  rashes. Psychiatric: Oriented to person, place and time. Demonstrates good judgement and reason without abnormal affect or behaviors.   RELEVANT LABS AND IMAGING: CBC    Component Value Date/Time   WBC 8.1 06/20/2024 1136   RBC 4.54 06/20/2024 1136   HGB 10.1 (L) 06/20/2024 1136   HGB 10.2 (L) 03/03/2024 0849   HGB 10.3 (L) 01/23/2023 1059   HCT 31.7 (L) 06/20/2024 1136   HCT 32.7 (L) 01/23/2023 1059   PLT 288.0 06/20/2024 1136   PLT 310 03/03/2024 0849   PLT 299 01/23/2023 1059   MCV 69.9 Repeated and verified X2. (L) 06/20/2024 1136   MCV 72 (L) 01/23/2023 1059   MCH 22.5 (L) 03/03/2024 0849   MCHC 31.8 06/20/2024 1136   RDW 15.3 06/20/2024 1136   RDW 16.0 (H) 01/23/2023 1059   LYMPHSABS 1.7 06/20/2024 1136   LYMPHSABS 1.9 01/23/2023 1059   MONOABS 0.6 06/20/2024 1136   EOSABS 0.0 06/20/2024 1136   EOSABS 0.1 01/23/2023 1059   BASOSABS 0.1 06/20/2024 1136   BASOSABS 0.0 01/23/2023 1059    CMP     Component Value Date/Time   NA 139 06/20/2024 1136   NA 140 06/09/2024 1044   K 4.3 06/20/2024 1136   CL 102 06/20/2024 1136   CO2 28 06/20/2024 1136   GLUCOSE 102 (H) 06/20/2024 1136   BUN 17 06/20/2024 1136   BUN 25 (H) 06/09/2024 1044   CREATININE 1.36 (H) 06/20/2024 1136   CREATININE 1.43 (H) 03/03/2024 0849   CREATININE 0.98 09/10/2017 1333   CALCIUM  9.7 06/20/2024 1136   PROT 8.3 06/20/2024 1136   PROT 8.1 05/20/2024 1053   ALBUMIN 4.4 06/20/2024 1136   ALBUMIN 4.3 06/09/2024 1044   AST 19 06/20/2024 1136   AST 20 03/03/2024 0849   ALT 17 06/20/2024 1136   ALT 21 03/03/2024 0849   ALKPHOS 85 06/20/2024 1136   BILITOT 0.5 06/20/2024 1136   BILITOT 0.5 05/20/2024 1053   BILITOT 0.4 03/03/2024 0849   GFRNONAA 44 (L) 03/03/2024 0849   GFRAA 69 08/25/2020 0838   GFRAA >60 03/02/2020 1020     Assessment/Plan:   Chronic idiopathic constipation GERD Patient seen today for follow-up of GERD and constipation.  Constipation has improved on Linzess  145 mcg  daily, though she still has some straining, having a bowel movement every 1 to 2 days.  No blood in stool or rectal pain.  Has not tried adding a fiber supplement. GERD symptoms now well-controlled on omeprazole  and famotidine  with no breakthrough  symptoms. Her primary concern at this time is bad breath which she states she has struggled with since her 59s.  Has seen her PCP and her dentist about this.  Was advised to manage reflux.  Reflux symptoms are now managed, but denies change in her breath. On exam she does have some gum recession of her upper molars, otherwise mouth appears normal with no lesions.  History of tonsillectomy.  I do not notice any halitosis and told her this.  It has been about 6 months or so since she saw her dentist and advised her to follow-up with them.  Continue her good oral hygiene practices.  - Will send refill of Linzess  145 mcg per patient request.  She is also interested in trying higher dose so we will give samples of 290 mcg. - Advised to start fiber supplement such as Benefiber 1 tablespoon daily - Continue omeprazole  40 mg daily, Pepcid  at night - Follow-up with dentist - Continue good oral hygiene - Could consider referral to ENT if patient desires - Follow-up 3 months   Camie Furbish, PA-C [facility] 09/03/2024, 9:21 AM  Patient Care Team: Early, Camie BRAVO, NP as PCP - General (Nurse Practitioner) Alvan Ronal BRAVO, MD (Inactive) as PCP - Cardiology (Cardiology) Sherrod Sherrod, MD as Consulting Physician (Oncology)    I have reviewed the clinic note as outlined by Camie Furbish, PA and agree with the assessment, plan and medical decision making.  Mattia returns to the gastroenterology office for follow-up of GERD.  Interval EGD was unremarkable and showed only reactive gastropathy.  Previous antacid therapy was changed from pantoprazole  to omeprazole  in the morning and famotidine  in the evening.  This combination has worked well for her no longer  having breakthrough symptoms.  Does endorse some halitosis.  Constipation is better on Linzess  145 mcg but has not entirely resolved.  Agree with trialing a higher dose of Linzess  290 mcg   Inocente Hausen, MD  "

## 2024-09-03 ENCOUNTER — Encounter: Payer: Self-pay | Admitting: Gastroenterology

## 2024-09-03 ENCOUNTER — Ambulatory Visit: Admitting: Gastroenterology

## 2024-09-03 VITALS — BP 120/52 | HR 100 | Ht 66.0 in | Wt 173.0 lb

## 2024-09-03 DIAGNOSIS — K5904 Chronic idiopathic constipation: Secondary | ICD-10-CM

## 2024-09-03 DIAGNOSIS — K219 Gastro-esophageal reflux disease without esophagitis: Secondary | ICD-10-CM | POA: Diagnosis not present

## 2024-09-03 MED ORDER — LINACLOTIDE 145 MCG PO CAPS: 145.0000 ug | ORAL_CAPSULE | Freq: Every day | ORAL | 2 refills | Status: DC

## 2024-09-03 NOTE — Patient Instructions (Addendum)
 We have sent the following medications to your pharmacy for you to pick up at your convenience: Linzess  145 mcg daily.   We have also given you samples of Linzess  290 mcg to try. Let us  know if this strength works better for you.  Follow up with your dentist regarding your bad breath concerns.  Please purchase the following medications over the counter and take as directed: Benefiber or Metamucil daily.  Continue good oral hygiene.   Increase your water intake.   Let us  know if you want the referral to Ear, Nose and Throat.   _______________________________________________________  If your blood pressure at your visit was 140/90 or greater, please contact your primary care physician to follow up on this.  _______________________________________________________  If you are age 53 or older, your body mass index should be between 23-30. Your Body mass index is 27.92 kg/m. If this is out of the aforementioned range listed, please consider follow up with your Primary Care Provider.  If you are age 53 or younger, your body mass index should be between 19-25. Your Body mass index is 27.92 kg/m. If this is out of the aformentioned range listed, please consider follow up with your Primary Care Provider.   ________________________________________________________  The [facility] providers would like to encourage you to use MYCHART to communicate with providers for non-urgent requests or questions.  Due to long hold times on the telephone, sending your provider a message by [facility] may be a faster and more efficient way to get a response.  Please allow 48 business hours for a response.  Please remember that this is for non-urgent requests.  _______________________________________________________  [facility] is using a team-based approach to care.  Your team is made up of your doctor and two to three APPS. Our APPS (Nurse Practitioners and Physician Assistants) work with your  physician to ensure care continuity for you. They are fully qualified to address your health concerns and develop a treatment plan. They communicate directly with your gastroenterologist to care for you. Seeing the Advanced Practice Practitioners on your physician's team can help you by facilitating care more promptly, often allowing for earlier appointments, access to diagnostic testing, procedures, and other specialty referrals.

## 2024-09-09 ENCOUNTER — Telehealth: Payer: Self-pay | Admitting: Gastroenterology

## 2024-09-09 ENCOUNTER — Encounter (INDEPENDENT_AMBULATORY_CARE_PROVIDER_SITE_OTHER): Payer: Self-pay

## 2024-09-09 DIAGNOSIS — R196 Halitosis: Secondary | ICD-10-CM

## 2024-09-09 MED ORDER — LINACLOTIDE 290 MCG PO CAPS: 290.0000 ug | ORAL_CAPSULE | Freq: Every day | ORAL | 5 refills | Status: AC

## 2024-09-09 NOTE — Telephone Encounter (Signed)
 Informed patient I sent in Linzess  290 mcg to her pharmacy. I asked patient if she had a preference on ENT office. Patient states she does not. Informed patient that I placed the referral to [facility] and they will contact her with an appointment. Patient verbalized understanding.

## 2024-09-09 NOTE — Telephone Encounter (Signed)
 PT is calling to update us  that the Linzess  290 works better than the 145 and she would like a prescription sent to [facility] [address]. She would also like the referral to the ENT sent out for her as discussed.

## 2024-09-10 ENCOUNTER — Other Ambulatory Visit

## 2024-09-10 ENCOUNTER — Ambulatory Visit: Admitting: Internal Medicine

## 2024-09-11 ENCOUNTER — Inpatient Hospital Stay: Admitting: Internal Medicine

## 2024-09-11 ENCOUNTER — Inpatient Hospital Stay: Attending: Internal Medicine

## 2024-09-11 VITALS — BP 146/73 | HR 72 | Temp 98.0°F | Resp 17 | Ht 66.0 in | Wt 173.0 lb

## 2024-09-11 DIAGNOSIS — D563 Thalassemia minor: Secondary | ICD-10-CM

## 2024-09-11 DIAGNOSIS — D509 Iron deficiency anemia, unspecified: Secondary | ICD-10-CM | POA: Insufficient documentation

## 2024-09-11 DIAGNOSIS — D472 Monoclonal gammopathy: Secondary | ICD-10-CM | POA: Diagnosis not present

## 2024-09-11 DIAGNOSIS — D508 Other iron deficiency anemias: Secondary | ICD-10-CM | POA: Diagnosis not present

## 2024-09-11 LAB — CBC WITH DIFFERENTIAL (CANCER CENTER ONLY)
Abs Immature Granulocytes: 0.02 K/uL (ref 0.00–0.07)
Basophils Absolute: 0.1 K/uL (ref 0.0–0.1)
Basophils Relative: 1 %
Eosinophils Absolute: 0 K/uL (ref 0.0–0.5)
Eosinophils Relative: 0 %
HCT: 32.1 % — ABNORMAL LOW (ref 36.0–46.0)
Hemoglobin: 10 g/dL — ABNORMAL LOW (ref 12.0–15.0)
Immature Granulocytes: 0 %
Lymphocytes Relative: 29 %
Lymphs Abs: 2.4 K/uL (ref 0.7–4.0)
MCH: 22.2 pg — ABNORMAL LOW (ref 26.0–34.0)
MCHC: 31.2 g/dL (ref 30.0–36.0)
MCV: 71.3 fL — ABNORMAL LOW (ref 80.0–100.0)
Monocytes Absolute: 0.6 K/uL (ref 0.1–1.0)
Monocytes Relative: 7 %
Neutro Abs: 5.3 K/uL (ref 1.7–7.7)
Neutrophils Relative %: 63 %
Platelet Count: 373 K/uL (ref 150–400)
RBC: 4.5 MIL/uL (ref 3.87–5.11)
RDW: 16.8 % — ABNORMAL HIGH (ref 11.5–15.5)
WBC Count: 8.3 K/uL (ref 4.0–10.5)
nRBC: 0 % (ref 0.0–0.2)

## 2024-09-11 LAB — IRON AND IRON BINDING CAPACITY (CC-WL,HP ONLY)
Iron: 66 ug/dL (ref 28–170)
Saturation Ratios: 22 % (ref 10.4–31.8)
TIBC: 295 ug/dL (ref 250–450)
UIBC: 229 ug/dL (ref 148–442)

## 2024-09-11 LAB — FERRITIN: Ferritin: 259 ng/mL (ref 11–307)

## 2024-09-11 NOTE — Progress Notes (Signed)
 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  PRIOR THERAPY: Feraheme  infusion on as-needed basis  CURRENT THERAPY: Over-the-counter ferrous sulfate 1 tablet p.o. daily.  INTERVAL HISTORY: Jessica [name] 53 y.o. female returns to the clinic today for follow-up visit.Discussed the use of AI scribe software for clinical note transcription with the patient, who gave verbal consent to proceed.  History of Present Illness Jessica [name] is a 53 year old female with microcytic anemia secondary to iron deficiency and beta-thalassemia minor who presents for evaluation with repeat blood work.  She has a history of microcytic anemia, iron deficiency, and beta-thalassemia minor. She is currently taking an iron supplement, 65 mg once daily. Her hemoglobin levels have remained stable over the past several months, with recent measurements showing hemoglobin at 10.0, similar to previous readings of 10.1 in August and 10.2 in April. She feels good and has no new complaints since her last visit.  She also has a history of monoclonal gammopathy of undetermined significance, which is currently under observation. There are no new symptoms or changes reported related to this condition.       MEDICAL HISTORY: Past Medical History:  Diagnosis Date   Abdominal bloating 01/29/2024   Acute left-sided thoracic back pain 01/29/2024   Allergy    seasonal   Anemia    Anxiety    Depression    Encounter for long-term (current) use of NSAIDs 01/29/2024   GERD (gastroesophageal reflux disease)    HYPERCHOLESTEROLEMIA 10/07/2007   Qualifier: Diagnosis of  By: Jacqueline Riggs     Hyperlipidemia    MGUS  (monoclonal gammopathy of unknown significance)     ALLERGIES:  is allergic to sulfonamide derivatives.  MEDICATIONS:  Current Outpatient Medications  Medication Sig Dispense Refill   amLODipine -atorvastatin  (CADUET ) 10-40 MG tablet Take 0.5 tablets by mouth daily. 30 tablet 3   buPROPion  (WELLBUTRIN  XL) 150 MG 24 hr tablet TAKE 1 TABLET BY MOUTH EVERY DAY 90 tablet 3   cycloSPORINE  (RESTASIS ) 0.05 % ophthalmic emulsion Place 1 drop into both eyes 2 (two) times daily. 1.5 mL 2   famotidine  (PEPCID ) 40 MG tablet TAKE 1 TABLET BY MOUTH EVERYDAY AT BEDTIME 90 tablet 1   linaclotide  (LINZESS ) 290 MCG CAPS capsule Take 1 capsule (290 mcg total) by mouth daily before breakfast. 30 capsule 5   losartan (COZAAR) 25 MG tablet      norethindrone-ethinyl estradiol -FE (LOESTRIN FE) 1-20 MG-MCG tablet Take 1 tablet by mouth daily. OK to skip placebo to prevent hormone fluctuations. 84 tablet 4   omeprazole  (PRILOSEC) 40 MG capsule Take 1 capsule (40 mg total) by mouth daily. 30 capsule 2   traZODone  (DESYREL ) 50 MG tablet TAKE 1/2 TO 1 TABLET BY MOUTH AT BEDTIME AS NEEDED FOR SLEEP 90 tablet 2   venlafaxine  XR (EFFEXOR -XR) 75 MG 24 hr capsule TAKE 1 TABLET (75MG ) EVERY OTHER DAY ALTERNATING WITH 150MG  VENLAFAXINE  FOR 14 DAYS THEN STOP 150MG  DOSE AND ONLY TAKE 75MG  DOSE DAILY WITH BREAKFAST 30 capsule 5   Vitamin D , Ergocalciferol , (DRISDOL ) 1.25 MG (50000 UNIT) CAPS capsule TAKE 1 CAPSULE (50,000 UNITS TOTAL) BY MOUTH EVERY 7 (SEVEN) DAYS 12 capsule 3   No current facility-administered medications for this visit.    SURGICAL  HISTORY:  Past Surgical History:  Procedure Laterality Date   TUBAL LIGATION      REVIEW OF SYSTEMS:  A comprehensive review of systems was negative.   PHYSICAL EXAMINATION: General appearance: alert, cooperative, and no distress Head: Normocephalic, without obvious abnormality, atraumatic Neck: no adenopathy, no JVD, supple, symmetrical, trachea midline, and thyroid  not  enlarged, symmetric, no tenderness/mass/nodules Lymph nodes: Cervical, supraclavicular, and axillary nodes normal. Resp: clear to auscultation bilaterally Back: symmetric, no curvature. ROM normal. No CVA tenderness. Cardio: regular rate and rhythm, S1, S2 normal, no murmur, click, rub or gallop GI: soft, non-tender; bowel sounds normal; no masses,  no organomegaly Extremities: extremities normal, atraumatic, no cyanosis or edema  ECOG PERFORMANCE STATUS: 1 - Symptomatic but completely ambulatory  Blood pressure (!) 146/73, pulse 72, temperature 98 F (36.7 C), temperature source Temporal, resp. rate 17, height 5' 6 (1.676 m), weight 173 lb (78.5 kg), last menstrual period 08/28/2023, SpO2 99%.  LABORATORY DATA: Lab Results  Component Value Date   WBC 8.3 09/11/2024   HGB 10.0 (L) 09/11/2024   HCT 32.1 (L) 09/11/2024   MCV 71.3 (L) 09/11/2024   PLT 373 09/11/2024      Chemistry      Component Value Date/Time   NA 139 06/20/2024 1136   NA 140 06/09/2024 1044   K 4.3 06/20/2024 1136   CL 102 06/20/2024 1136   CO2 28 06/20/2024 1136   BUN 17 06/20/2024 1136   BUN 25 (H) 06/09/2024 1044   CREATININE 1.36 (H) 06/20/2024 1136   CREATININE 1.43 (H) 03/03/2024 0849   CREATININE 0.98 09/10/2017 1333      Component Value Date/Time   CALCIUM  9.7 06/20/2024 1136   ALKPHOS 85 06/20/2024 1136   AST 19 06/20/2024 1136   AST 20 03/03/2024 0849   ALT 17 06/20/2024 1136   ALT 21 03/03/2024 0849   BILITOT 0.5 06/20/2024 1136   BILITOT 0.5 05/20/2024 1053   BILITOT 0.4 03/03/2024 0849       RADIOGRAPHIC STUDIES: No results found.   ASSESSMENT AND PLAN: This is a very pleasant 53 years old African-American female presented for evaluation of microcytic hypochromic anemia likely secondary to beta thalassemia minor as well as iron deficiency.   She was previously treated with Feraheme  infusion.   She is currently on over-the-counter ferrous sulfate 1 tablet p.o. daily and tolerating  it fairly well. Assessment and Plan Assessment & Plan Microcytic anemia due to iron deficiency secondary to menorrhagia Hemoglobin levels are stable at 10.0, consistent with previous readings of 10.1 in August and 10.2 in April. - Await current iron level results - Arrange for iron infusion if iron levels are very low - Schedule follow-up in six months if iron levels are adequate  Beta thalassemia minor Contributes to persistently low hemoglobin levels, no change in management as hemoglobin levels are consistent with previous results.  Monoclonal gammopathy of undetermined significance (MGUS) Currently under observation with no new symptoms or changes reported. She was advised to call immediately if she has any other concerning symptoms in the interval.  The patient voices understanding of current disease status and treatment options and is in agreement with the current care plan.  All questions were answered. The patient knows to call the clinic with any problems, questions or concerns. We can certainly see the patient much sooner if necessary.  Disclaimer: This note was dictated with voice recognition software. Similar sounding words can inadvertently be transcribed and may not be corrected upon review.

## 2024-09-12 ENCOUNTER — Other Ambulatory Visit: Payer: Self-pay | Admitting: Physician Assistant

## 2024-09-18 ENCOUNTER — Encounter: Payer: Self-pay | Admitting: Nurse Practitioner

## 2024-10-09 ENCOUNTER — Other Ambulatory Visit: Payer: Self-pay | Admitting: Nurse Practitioner

## 2024-10-09 DIAGNOSIS — F5101 Primary insomnia: Secondary | ICD-10-CM

## 2024-10-13 NOTE — Telephone Encounter (Signed)
 Last appt. 06/24/24.

## 2024-10-15 DIAGNOSIS — N1832 Chronic kidney disease, stage 3b: Secondary | ICD-10-CM | POA: Diagnosis not present

## 2024-10-15 DIAGNOSIS — D472 Monoclonal gammopathy: Secondary | ICD-10-CM | POA: Diagnosis not present

## 2024-10-15 DIAGNOSIS — I129 Hypertensive chronic kidney disease with stage 1 through stage 4 chronic kidney disease, or unspecified chronic kidney disease: Secondary | ICD-10-CM | POA: Diagnosis not present

## 2024-10-21 ENCOUNTER — Institutional Professional Consult (permissible substitution) (INDEPENDENT_AMBULATORY_CARE_PROVIDER_SITE_OTHER): Admitting: Otolaryngology

## 2024-11-04 ENCOUNTER — Other Ambulatory Visit: Payer: Self-pay | Admitting: Family Medicine

## 2024-11-04 DIAGNOSIS — F5101 Primary insomnia: Secondary | ICD-10-CM

## 2024-11-12 ENCOUNTER — Encounter (INDEPENDENT_AMBULATORY_CARE_PROVIDER_SITE_OTHER): Payer: Self-pay | Admitting: Otolaryngology

## 2024-11-12 ENCOUNTER — Ambulatory Visit (INDEPENDENT_AMBULATORY_CARE_PROVIDER_SITE_OTHER): Admitting: Otolaryngology

## 2024-11-12 VITALS — BP 145/81 | HR 95 | Ht 66.0 in | Wt 168.0 lb

## 2024-11-12 DIAGNOSIS — R196 Halitosis: Secondary | ICD-10-CM | POA: Diagnosis not present

## 2024-11-12 DIAGNOSIS — K219 Gastro-esophageal reflux disease without esophagitis: Secondary | ICD-10-CM

## 2024-11-12 MED ORDER — CHLORHEXIDINE GLUCONATE 0.12 % MT SOLN: 15.0000 mL | Freq: Two times a day (BID) | OROMUCOSAL | 0 refills | Status: AC

## 2024-11-12 NOTE — Progress Notes (Signed)
 Dear Dr. Arletta, Here is my assessment for our mutual patient, Jessica [name]. Thank you for allowing me the opportunity to care for your patient. Please do not hesitate to contact me should you have any other questions. Sincerely, Dr. Eldora [name]  Otolaryngology Clinic Note Referring provider: Dr. Arletta HPI:  Jessica [name] is a 53 y.o. female kindly referred by Dr. Arletta for evaluation of halitosis  Initial visit (10/2024): Discussed the use of AI scribe software for clinical note transcription with the patient, who gave verbal consent to proceed.  History of Present Illness Jessica [name] is a 53 year old female with a history of tonsillectomy who presents for evaluation of chronic halitosis.  She reports continuous halitosis for about ten years that does not improve with brushing, flossing, mouthwash, or chewing gum. The odor is noticeable to others and causes marked social distress. Feels like getting more persistent/worse.  She maintains meticulous oral hygiene and has had dental evaluation without clear cause. She has not had other otolaryngologic surgeries aside from tonsillectomy in her twenties.  Gastrointestinal workup included a normal endoscopy. She has gastroesophageal reflux disease controlled on twice daily medication and currently has improved but somer persistent reflux sx. She does not use alginate therapy. No sjogren's sx.   She denies oral pain, voice change, dyspnea, weight loss, frequent pneumonias, bronchitis, cystic fibrosis, other pulmonary disease, or nasal foreign body. She does not smoke  Patient otherwise denies: - dysphagia, odynophagia, aspiration episodes or PNA, need for Heimlich, unintentional weight loss - changes in voice, shortness of breath, hemoptysis - ear pain, neck masses   ENT Surgery: Tonsillectomy Personal or FHx of bleeding dz or anesthesia difficulty: no  Tobacco: no.  PMHx: GAD/MDD, GERD, HLD, MGUS, IDA, Beta  Thalassemia  Independent Review of Additional Tests or Records:  Jessica Arletta, PA (09/03/2024): noted GERD and throat clearing; no dysphagia; using NSAIDS 1-2/week; improved on PPI/H2; noted bad breath, despite good oral hygiene, no tonsils; no smoking; ref to ENT for throat clearing and halitosis [facility] (03/27/2022): noted halitosis, no nasal or sinus issues; no evidence of pathology, f/u PRN Labs CBC10/30/2025: WBC 8.3, Eos 0 EGD 07/22/2024 reviewed:   PMH/Meds/All/SocHx/FamHx/ROS:   Past Medical History:  Diagnosis Date   Abdominal bloating 01/29/2024   Acute left-sided thoracic back pain 01/29/2024   Allergy    seasonal   Anemia    Anxiety    Depression    Encounter for long-term (current) use of NSAIDs 01/29/2024   GERD (gastroesophageal reflux disease)    HYPERCHOLESTEROLEMIA 10/07/2007   Qualifier: Diagnosis of  By: Jacqueline Riggs     Hyperlipidemia    MGUS (monoclonal gammopathy of unknown significance)      Past Surgical History:  Procedure Laterality Date   TUBAL LIGATION      Family History  Problem Relation Age of Onset   Heart disease Mother 4       MI   Hyperlipidemia Mother    Hypertension Mother    Alcohol abuse Father    Drug abuse Father    Hyperlipidemia Father    Hypertension Father    Alcohol abuse Maternal Uncle    Colon polyps Maternal Grandmother    Heart disease Maternal Grandmother    Hypertension Maternal Grandmother    Hyperlipidemia Maternal Grandmother    Cancer Paternal Grandmother    Diabetes Paternal Grandmother    Hypertension Paternal Grandmother    Cancer Paternal Grandfather    Breast cancer Neg Hx    Colon cancer Neg  Hx    Esophageal cancer Neg Hx    Stomach cancer Neg Hx    Rectal cancer Neg Hx      Social Connections: Moderately Isolated (05/13/2024)   Social Connection and Isolation Panel    Frequency of Communication with Friends and Family: More than three times a week    Frequency of Social Gatherings with  Friends and Family: Once a week    Attends Religious Services: Never    Database Administrator or Organizations: No    Attends Engineer, Structural: Not on file    Marital Status: Married     Current Medications[1]   Physical Exam:   BP (!) 145/81 (BP Location: Left Arm, Patient Position: Sitting, Cuff Size: Large)   Pulse 95   Ht 5' 6 (1.676 m)   Wt 168 lb (76.2 kg)   LMP 08/28/2023   SpO2 92%   BMI 27.12 kg/m   Salient findings:  CN II-XII intact  Bilateral EAC clear and TM intact with well pneumatized middle ear spaces Anterior rhinoscopy: Septum intact; bilateral inferior turbinates without significant hypertrophy No lesions of oral cavity/oropharynx; dentition good, no obvious caries; no tonsils; no objective halitosis appreciated today; no candida changes No obviously palpable neck masses/lymphadenopathy/thyromegaly No respiratory distress or stridor; TFL was indicated to better evaluate the proximal airway, given the patient's history and exam findings, and is detailed below.  Seprately Identifiable Procedures:  Prior to initiating any procedures, risks/benefits/alternatives were explained to the patient and verbal consent obtained. Procedure Note Pre-procedure diagnosis:  Halitosis, LPR Post-procedure diagnosis: Same Procedure: Transnasal Fiberoptic Laryngoscopy, CPT 31575 - Mod 25 Indication: see above Complications: None apparent EBL: 0 mL  The procedure was undertaken to further evaluate the patient's complaint above, with mirror exam inadequate for appropriate examination due to gag reflex and poor patient tolerance  Procedure:  Patient was identified as correct patient. Verbal consent was obtained. The nose was sprayed with oxymetazoline and 4% lidocaine . The The flexible laryngoscope was passed through the nose to view the nasal cavity, pharynx (oropharynx, hypopharynx) and larynx.  The larynx was examined at rest and during multiple phonatory tasks.  Documentation was obtained and reviewed with patient. The scope was removed. The patient tolerated the procedure well.  Findings: The nasal cavity and nasopharynx did not reveal any masses or lesions, mucosa appeared to be without obvious lesions. The tongue base, pharyngeal walls, piriform sinuses, vallecula, epiglottis and postcricoid region are normal in appearance without candidiasis or upper airway lesion suggestive of halitosis contributor. The visualized portion of the subglottis and proximal trachea is widely patent. The vocal folds are mobile bilaterally. There are no lesions on the free edge of the vocal folds nor elsewhere in the larynx worrisome for malignancy.    Electronically signed by: Jessica KATHEE Blanch, MD 11/12/2024 9:08 AM   Impression & Plans:  Krystyn Picking is a 53 y.o. female with:  1. Halitosis   2. Laryngopharyngeal reflux (LPR)    Discussed reassuring TFL and cannot see obvious cause intraorally that would point to halitosis. Most cases due to dental in origin and she will contact her dentist. She does have reflux which can contribute and will trial barrier agent to see if improves Can trial peridex mouthwash for a week -- if improves, can consider oral flora/bacteria as cause  See below regarding exact medications prescribed this encounter including dosages and route: Meds ordered this encounter  Medications   chlorhexidine (PERIDEX) 0.12 % solution    Sig:  Use as directed 15 mLs in the mouth or throat 2 (two) times daily for 7 days.    Dispense:  210 mL    Refill:  0      Thank you for allowing me the opportunity to care for your patient. Please do not hesitate to contact me should you have any other questions.  Sincerely, Jessica Blanch, MD Otolaryngologist (ENT), [facility] Phone: [phone] Fax: [phone]  11/12/2024, 9:08 AM   MDM:  Level 4 - [id number] Complexity/Problems addressed: mod - chronic worsening problem Data complexity:  mod - independent review of notes, labs, test - Morbidity: low  - Prescription Drug prescribed or managed: n      [1]  Current Outpatient Medications:    amLODipine -atorvastatin  (CADUET ) 10-40 MG tablet, Take 0.5 tablets by mouth daily., Disp: 30 tablet, Rfl: 3   buPROPion  (WELLBUTRIN  XL) 150 MG 24 hr tablet, TAKE 1 TABLET BY MOUTH EVERY DAY, Disp: 90 tablet, Rfl: 3   chlorhexidine (PERIDEX) 0.12 % solution, Use as directed 15 mLs in the mouth or throat 2 (two) times daily for 7 days., Disp: 210 mL, Rfl: 0   cycloSPORINE  (RESTASIS ) 0.05 % ophthalmic emulsion, Place 1 drop into both eyes 2 (two) times daily., Disp: 1.5 mL, Rfl: 2   famotidine  (PEPCID ) 40 MG tablet, TAKE 1 TABLET BY MOUTH EVERYDAY AT BEDTIME, Disp: 90 tablet, Rfl: 1   linaclotide  (LINZESS ) 290 MCG CAPS capsule, Take 1 capsule (290 mcg total) by mouth daily before breakfast., Disp: 30 capsule, Rfl: 5   losartan (COZAAR) 25 MG tablet, , Disp: , Rfl:    norethindrone-ethinyl estradiol -FE (LOESTRIN FE) 1-20 MG-MCG tablet, Take 1 tablet by mouth daily. OK to skip placebo to prevent hormone fluctuations., Disp: 84 tablet, Rfl: 4   omeprazole  (PRILOSEC) 40 MG capsule, TAKE 1 CAPSULE (40 MG TOTAL) BY MOUTH DAILY., Disp: 90 capsule, Rfl: 3   traZODone  (DESYREL ) 50 MG tablet, TAKE 1/2 TO 1 TABLET BY MOUTH AT BEDTIME AS NEEDED FOR SLEEP, Disp: 90 tablet, Rfl: 1   venlafaxine  XR (EFFEXOR -XR) 75 MG 24 hr capsule, TAKE 1 TABLET (75MG ) EVERY OTHER DAY ALTERNATING WITH 150MG  VENLAFAXINE  FOR 14 DAYS THEN STOP 150MG  DOSE AND ONLY TAKE 75MG  DOSE DAILY WITH BREAKFAST, Disp: 30 capsule, Rfl: 5   Vitamin D , Ergocalciferol , (DRISDOL ) 1.25 MG (50000 UNIT) CAPS capsule, TAKE 1 CAPSULE (50,000 UNITS TOTAL) BY MOUTH EVERY 7 (SEVEN) DAYS, Disp: 12 capsule, Rfl: 3

## 2024-11-17 ENCOUNTER — Other Ambulatory Visit (HOSPITAL_COMMUNITY): Payer: Self-pay

## 2024-11-17 ENCOUNTER — Encounter: Payer: Self-pay | Admitting: Internal Medicine

## 2024-11-17 ENCOUNTER — Telehealth: Payer: Self-pay

## 2024-11-17 NOTE — Telephone Encounter (Signed)
 Do you have a PA on this.   Copied from CRM [id number]. Topic: Clinical - Medication Question >> Nov 17, 2024  1:55 PM Jessica [name] wrote: Reason for CRM: Pt calling, states she received a letter from the [employer] stating they will no longer cover medication she is currently taking, as of November 13, 2024.  Medication: amLODipine -atorvastatin  (CADUET ) 10-40 MG tablet  Pt would like to know of a replacement that she can start taking, that insurance will cover.  Pt is requesting a follow up call to discuss further.  Can be reached at [phone] to discuss further.  Pt is aware of same day call back.   Preferred pharmacy:  [id number] [address], [place] - [address] [place] [address] Phone: [phone] Fax: [phone]

## 2024-11-18 ENCOUNTER — Encounter: Payer: Self-pay | Admitting: Internal Medicine

## 2024-11-18 ENCOUNTER — Telehealth: Payer: Self-pay | Admitting: Pharmacy Technician

## 2024-11-18 ENCOUNTER — Other Ambulatory Visit (HOSPITAL_COMMUNITY): Payer: Self-pay

## 2024-11-18 DIAGNOSIS — I10 Essential (primary) hypertension: Secondary | ICD-10-CM

## 2024-11-18 DIAGNOSIS — E782 Mixed hyperlipidemia: Secondary | ICD-10-CM

## 2024-11-18 NOTE — Telephone Encounter (Signed)
 Pharmacy Patient Advocate Encounter   Received notification from Pt Calls Messages that prior authorization for amLODipine -atorvastatin  (CADUET ) 10-40 MG is required/requested.   Insurance verification completed.   The patient is insured through [facility].   Per test claim:  Amlodipine  and Atorvastatin  separately is preferred by the insurance.  If suggested medication is appropriate, Please send in a new RX and discontinue this one. If not, please advise as to why it's not appropriate so that we may request a Prior Authorization. Please note, some preferred medications may still require a PA.  If the suggested medications have not been trialed and there are no contraindications to their use, the PA will not be submitted, as it will not be approved. Atorvastatin  is $10 a month and 3.77 for the amlodipine  (through [facility])

## 2024-11-18 NOTE — Telephone Encounter (Signed)
 PA request has been Received. New Encounter has been or will be created for follow up. For additional info see Pharmacy Prior Auth telephone encounter from 11/19/23.

## 2024-11-19 MED ORDER — AMLODIPINE BESYLATE 10 MG PO TABS: 10.0000 mg | ORAL_TABLET | Freq: Every day | ORAL | 1 refills | Status: AC

## 2024-11-19 MED ORDER — ATORVASTATIN CALCIUM 40 MG PO TABS: 40.0000 mg | ORAL_TABLET | Freq: Every day | ORAL | 1 refills | Status: AC

## 2024-11-19 NOTE — Telephone Encounter (Signed)
 Please let her know that I have sent the atorvastatin  and [facility]lodipine  [facility]. It looks like her insurance covers the medication individually much better than together.   If this is not ok with her, let me know and we can see what else we can do.

## 2024-11-21 ENCOUNTER — Ambulatory Visit: Payer: Self-pay | Admitting: Internal Medicine

## 2024-12-11 NOTE — Progress Notes (Signed)
 Jessica [name]                                          MRN: [id number]   12/11/2024   The [facility] Quality Team Specialist reviewed this patient medical record for the purposes of chart review for care gap closure. The following were reviewed: chart review for care gap closure-controlling blood pressure.    [facility] Quality Team

## 2025-03-12 ENCOUNTER — Inpatient Hospital Stay: Admitting: Internal Medicine

## 2025-03-12 ENCOUNTER — Inpatient Hospital Stay

## 2025-06-25 ENCOUNTER — Encounter: Payer: Self-pay | Admitting: Nurse Practitioner
